# Patient Record
Sex: Female | Born: 1935 | Race: White | Hispanic: No | Smoking: Former smoker
Health system: Southern US, Community
[De-identification: ages and names within clinical notes are randomized; demographics above are authoritative.]

## PROBLEM LIST (undated history)

## (undated) DIAGNOSIS — Z8669 Personal history of other diseases of the nervous system and sense organs: Secondary | ICD-10-CM

## (undated) DIAGNOSIS — K219 Gastro-esophageal reflux disease without esophagitis: Secondary | ICD-10-CM

## (undated) DIAGNOSIS — I10 Essential (primary) hypertension: Secondary | ICD-10-CM

## (undated) DIAGNOSIS — K589 Irritable bowel syndrome without diarrhea: Secondary | ICD-10-CM

## (undated) DIAGNOSIS — R200 Anesthesia of skin: Secondary | ICD-10-CM

## (undated) DIAGNOSIS — N329 Bladder disorder, unspecified: Secondary | ICD-10-CM

## (undated) DIAGNOSIS — G309 Alzheimer's disease, unspecified: Secondary | ICD-10-CM

## (undated) DIAGNOSIS — J329 Chronic sinusitis, unspecified: Secondary | ICD-10-CM

## (undated) DIAGNOSIS — N393 Stress incontinence (female) (male): Secondary | ICD-10-CM

## (undated) DIAGNOSIS — G589 Mononeuropathy, unspecified: Secondary | ICD-10-CM

## (undated) DIAGNOSIS — I679 Cerebrovascular disease, unspecified: Secondary | ICD-10-CM

## (undated) DIAGNOSIS — F0280 Dementia in other diseases classified elsewhere without behavioral disturbance: Secondary | ICD-10-CM

## (undated) DIAGNOSIS — N3281 Overactive bladder: Secondary | ICD-10-CM

## (undated) DIAGNOSIS — R351 Nocturia: Secondary | ICD-10-CM

## (undated) DIAGNOSIS — M419 Scoliosis, unspecified: Secondary | ICD-10-CM

## (undated) DIAGNOSIS — F4322 Adjustment disorder with anxiety: Secondary | ICD-10-CM

## (undated) DIAGNOSIS — H40009 Preglaucoma, unspecified, unspecified eye: Secondary | ICD-10-CM

## (undated) DIAGNOSIS — E785 Hyperlipidemia, unspecified: Secondary | ICD-10-CM

## (undated) DIAGNOSIS — K648 Other hemorrhoids: Secondary | ICD-10-CM

## (undated) DIAGNOSIS — Z9289 Personal history of other medical treatment: Secondary | ICD-10-CM

## (undated) DIAGNOSIS — B372 Candidiasis of skin and nail: Secondary | ICD-10-CM

## (undated) DIAGNOSIS — I2510 Atherosclerotic heart disease of native coronary artery without angina pectoris: Secondary | ICD-10-CM

## (undated) DIAGNOSIS — E039 Hypothyroidism, unspecified: Secondary | ICD-10-CM

## (undated) DIAGNOSIS — R202 Paresthesia of skin: Secondary | ICD-10-CM

## (undated) HISTORY — DX: Overactive bladder: N32.81

## (undated) HISTORY — DX: Hyperlipidemia, unspecified: E78.5

## (undated) HISTORY — DX: Alzheimer's disease, unspecified: G30.9

## (undated) HISTORY — PX: CORONARY ANGIOPLASTY WITH STENT PLACEMENT: SHX49

## (undated) HISTORY — DX: Gastro-esophageal reflux disease without esophagitis: K21.9

## (undated) HISTORY — DX: Adjustment disorder with anxiety: F43.22

## (undated) HISTORY — DX: Candidiasis of skin and nail: B37.2

## (undated) HISTORY — DX: Atherosclerotic heart disease of native coronary artery without angina pectoris: I25.10

## (undated) HISTORY — DX: Chronic sinusitis, unspecified: J32.9

## (undated) HISTORY — DX: Cerebrovascular disease, unspecified: I67.9

## (undated) HISTORY — DX: Other hemorrhoids: K64.8

## (undated) HISTORY — DX: Essential (primary) hypertension: I10

## (undated) HISTORY — PX: CARDIAC CATHETERIZATION: SHX172

## (undated) HISTORY — DX: Dementia in other diseases classified elsewhere without behavioral disturbance: F02.80

## (undated) HISTORY — DX: Personal history of other medical treatment: Z92.89

## (undated) HISTORY — PX: CATARACT EXTRACTION W/ INTRAOCULAR LENS  IMPLANT, BILATERAL: SHX1307

---

## 1966-12-22 HISTORY — PX: VAGINAL HYSTERECTOMY: SUR661

## 1974-12-22 HISTORY — PX: CHOLECYSTECTOMY: SHX55

## 2000-03-17 ENCOUNTER — Encounter: Admission: RE | Admit: 2000-03-17 | Discharge: 2000-03-17 | Payer: Self-pay | Admitting: Family Medicine

## 2000-03-17 ENCOUNTER — Encounter: Payer: Self-pay | Admitting: Family Medicine

## 2000-07-28 ENCOUNTER — Encounter: Admission: RE | Admit: 2000-07-28 | Discharge: 2000-07-28 | Payer: Self-pay | Admitting: Family Medicine

## 2000-07-28 ENCOUNTER — Encounter: Payer: Self-pay | Admitting: Family Medicine

## 2001-07-14 ENCOUNTER — Ambulatory Visit (HOSPITAL_BASED_OUTPATIENT_CLINIC_OR_DEPARTMENT_OTHER): Admission: RE | Admit: 2001-07-14 | Discharge: 2001-07-14 | Payer: Self-pay | Admitting: Pulmonary Disease

## 2001-08-31 ENCOUNTER — Ambulatory Visit (HOSPITAL_COMMUNITY): Admission: RE | Admit: 2001-08-31 | Discharge: 2001-09-01 | Payer: Self-pay | Admitting: Cardiology

## 2001-10-08 ENCOUNTER — Ambulatory Visit (HOSPITAL_BASED_OUTPATIENT_CLINIC_OR_DEPARTMENT_OTHER): Admission: RE | Admit: 2001-10-08 | Discharge: 2001-10-08 | Payer: Self-pay | Admitting: Pulmonary Disease

## 2002-02-23 ENCOUNTER — Encounter: Admission: RE | Admit: 2002-02-23 | Discharge: 2002-02-23 | Payer: Self-pay | Admitting: Family Medicine

## 2002-02-23 ENCOUNTER — Encounter: Payer: Self-pay | Admitting: Family Medicine

## 2002-06-29 ENCOUNTER — Encounter: Payer: Self-pay | Admitting: Gastroenterology

## 2002-06-29 DIAGNOSIS — K648 Other hemorrhoids: Secondary | ICD-10-CM | POA: Insufficient documentation

## 2002-10-12 ENCOUNTER — Other Ambulatory Visit: Admission: RE | Admit: 2002-10-12 | Discharge: 2002-10-12 | Payer: Self-pay | Admitting: Family Medicine

## 2003-01-09 ENCOUNTER — Inpatient Hospital Stay (HOSPITAL_COMMUNITY): Admission: EM | Admit: 2003-01-09 | Discharge: 2003-01-11 | Payer: Self-pay | Admitting: Emergency Medicine

## 2003-01-09 ENCOUNTER — Encounter: Payer: Self-pay | Admitting: Emergency Medicine

## 2003-04-05 ENCOUNTER — Encounter: Admission: RE | Admit: 2003-04-05 | Discharge: 2003-04-05 | Payer: Self-pay | Admitting: Family Medicine

## 2003-04-05 ENCOUNTER — Encounter: Payer: Self-pay | Admitting: Family Medicine

## 2003-11-10 ENCOUNTER — Encounter: Admission: RE | Admit: 2003-11-10 | Discharge: 2003-11-10 | Payer: Self-pay | Admitting: Family Medicine

## 2004-07-15 ENCOUNTER — Encounter: Admission: RE | Admit: 2004-07-15 | Discharge: 2004-07-15 | Payer: Self-pay | Admitting: Family Medicine

## 2004-10-16 ENCOUNTER — Other Ambulatory Visit: Admission: RE | Admit: 2004-10-16 | Discharge: 2004-10-16 | Payer: Self-pay | Admitting: Family Medicine

## 2005-08-15 ENCOUNTER — Ambulatory Visit: Payer: Self-pay | Admitting: Cardiology

## 2005-08-22 ENCOUNTER — Ambulatory Visit: Payer: Self-pay | Admitting: Cardiology

## 2005-08-22 ENCOUNTER — Encounter: Admission: RE | Admit: 2005-08-22 | Discharge: 2005-08-22 | Payer: Self-pay | Admitting: Family Medicine

## 2005-09-04 ENCOUNTER — Encounter: Admission: RE | Admit: 2005-09-04 | Discharge: 2005-09-04 | Payer: Self-pay | Admitting: Family Medicine

## 2005-10-23 ENCOUNTER — Encounter: Admission: RE | Admit: 2005-10-23 | Discharge: 2005-10-23 | Payer: Self-pay | Admitting: Family Medicine

## 2005-11-04 ENCOUNTER — Other Ambulatory Visit: Admission: RE | Admit: 2005-11-04 | Discharge: 2005-11-04 | Payer: Self-pay | Admitting: Family Medicine

## 2006-07-21 ENCOUNTER — Encounter: Admission: RE | Admit: 2006-07-21 | Discharge: 2006-07-21 | Payer: Self-pay | Admitting: Family Medicine

## 2006-09-03 ENCOUNTER — Encounter: Admission: RE | Admit: 2006-09-03 | Discharge: 2006-09-03 | Payer: Self-pay | Admitting: Family Medicine

## 2006-11-30 ENCOUNTER — Ambulatory Visit: Payer: Self-pay | Admitting: Cardiology

## 2006-12-22 LAB — CONVERTED CEMR LAB: Pap Smear: NORMAL

## 2007-07-15 ENCOUNTER — Ambulatory Visit: Payer: Self-pay | Admitting: Gastroenterology

## 2007-08-30 ENCOUNTER — Encounter: Admission: RE | Admit: 2007-08-30 | Discharge: 2007-08-30 | Payer: Self-pay | Admitting: Family Medicine

## 2007-09-14 ENCOUNTER — Encounter: Admission: RE | Admit: 2007-09-14 | Discharge: 2007-09-14 | Payer: Self-pay | Admitting: Family Medicine

## 2007-09-23 ENCOUNTER — Encounter: Admission: RE | Admit: 2007-09-23 | Discharge: 2007-10-08 | Payer: Self-pay | Admitting: Family Medicine

## 2008-02-10 ENCOUNTER — Ambulatory Visit: Payer: Self-pay | Admitting: Cardiovascular Disease

## 2008-03-05 ENCOUNTER — Emergency Department (HOSPITAL_COMMUNITY): Admission: EM | Admit: 2008-03-05 | Discharge: 2008-03-05 | Payer: Self-pay | Admitting: Emergency Medicine

## 2008-03-07 ENCOUNTER — Inpatient Hospital Stay (HOSPITAL_COMMUNITY): Admission: EM | Admit: 2008-03-07 | Discharge: 2008-03-10 | Payer: Self-pay | Admitting: Emergency Medicine

## 2008-04-05 DIAGNOSIS — I2510 Atherosclerotic heart disease of native coronary artery without angina pectoris: Secondary | ICD-10-CM

## 2008-04-05 DIAGNOSIS — K219 Gastro-esophageal reflux disease without esophagitis: Secondary | ICD-10-CM

## 2008-04-05 DIAGNOSIS — K589 Irritable bowel syndrome without diarrhea: Secondary | ICD-10-CM

## 2008-04-05 DIAGNOSIS — E039 Hypothyroidism, unspecified: Secondary | ICD-10-CM

## 2008-04-05 HISTORY — DX: Gastro-esophageal reflux disease without esophagitis: K21.9

## 2008-10-16 ENCOUNTER — Encounter: Payer: Self-pay | Admitting: Internal Medicine

## 2008-10-16 ENCOUNTER — Encounter: Admission: RE | Admit: 2008-10-16 | Discharge: 2008-10-16 | Payer: Self-pay | Admitting: Family Medicine

## 2008-11-20 ENCOUNTER — Telehealth: Payer: Self-pay | Admitting: Internal Medicine

## 2009-01-12 ENCOUNTER — Encounter: Payer: Self-pay | Admitting: Internal Medicine

## 2009-01-15 ENCOUNTER — Ambulatory Visit: Payer: Self-pay | Admitting: Internal Medicine

## 2009-01-15 DIAGNOSIS — B372 Candidiasis of skin and nail: Secondary | ICD-10-CM

## 2009-01-15 DIAGNOSIS — E785 Hyperlipidemia, unspecified: Secondary | ICD-10-CM

## 2009-01-15 DIAGNOSIS — T887XXA Unspecified adverse effect of drug or medicament, initial encounter: Secondary | ICD-10-CM | POA: Insufficient documentation

## 2009-01-15 DIAGNOSIS — M949 Disorder of cartilage, unspecified: Secondary | ICD-10-CM

## 2009-01-15 DIAGNOSIS — F4322 Adjustment disorder with anxiety: Secondary | ICD-10-CM

## 2009-01-15 DIAGNOSIS — M899 Disorder of bone, unspecified: Secondary | ICD-10-CM | POA: Insufficient documentation

## 2009-01-15 LAB — CONVERTED CEMR LAB
Eosinophils Relative: 1.6 % (ref 0.0–5.0)
Lymphocytes Relative: 35.2 % (ref 12.0–46.0)
MCV: 89.4 fL (ref 78.0–100.0)
Monocytes Absolute: 0.4 10*3/uL (ref 0.1–1.0)
Neutro Abs: 2.8 10*3/uL (ref 1.4–7.7)
Neutrophils Relative %: 53.5 % (ref 43.0–77.0)
RDW: 12.6 % (ref 11.5–14.6)
T4, Total: 10.7 ug/dL (ref 5.0–12.5)
WBC: 5.1 10*3/uL (ref 4.5–10.5)

## 2009-02-15 ENCOUNTER — Ambulatory Visit: Payer: Self-pay | Admitting: Cardiovascular Disease

## 2009-02-15 LAB — CONVERTED CEMR LAB
AST: 27 units/L (ref 0–37)
Bilirubin, Direct: 0.2 mg/dL (ref 0.0–0.3)
Cholesterol: 264 mg/dL (ref 0–200)
HDL: 52.1 mg/dL (ref >39.0)
Total Protein: 7.3 g/dL (ref 6.0–8.3)
Triglycerides: 139 mg/dL (ref 0–149)
VLDL: 28 mg/dL (ref 0–40)

## 2009-03-01 ENCOUNTER — Encounter: Payer: Self-pay | Admitting: Cardiology

## 2009-03-01 ENCOUNTER — Ambulatory Visit: Payer: Self-pay

## 2009-03-07 ENCOUNTER — Ambulatory Visit: Payer: Self-pay | Admitting: Internal Medicine

## 2009-03-07 LAB — CONVERTED CEMR LAB
HDL goal, serum: 40 mg/dL
LDL Goal: 100 mg/dL

## 2009-05-15 ENCOUNTER — Encounter (INDEPENDENT_AMBULATORY_CARE_PROVIDER_SITE_OTHER): Payer: Self-pay | Admitting: *Deleted

## 2009-05-31 ENCOUNTER — Ambulatory Visit: Payer: Self-pay | Admitting: Internal Medicine

## 2009-05-31 LAB — CONVERTED CEMR LAB
ALT: 26 units/L (ref 0–35)
AST: 27 units/L (ref 0–37)
Albumin: 3.7 g/dL (ref 3.5–5.2)
Cholesterol: 232 mg/dL — ABNORMAL HIGH (ref 0–200)
Direct LDL: 176.9 mg/dL
HDL: 50.6 mg/dL (ref >39.00)
Total Protein: 7.2 g/dL (ref 6.0–8.3)

## 2009-06-06 ENCOUNTER — Ambulatory Visit: Payer: Self-pay | Admitting: Internal Medicine

## 2009-06-19 ENCOUNTER — Encounter (INDEPENDENT_AMBULATORY_CARE_PROVIDER_SITE_OTHER): Payer: Self-pay | Admitting: *Deleted

## 2009-08-01 ENCOUNTER — Ambulatory Visit: Payer: Self-pay | Admitting: Internal Medicine

## 2009-08-01 LAB — CONVERTED CEMR LAB
Albumin: 3.8 g/dL (ref 3.5–5.2)
Alkaline Phosphatase: 24 units/L — ABNORMAL LOW (ref 39–117)
Direct LDL: 163.5 mg/dL
HDL: 49.2 mg/dL (ref >39.00)
Iron: 98 ug/dL (ref 42–145)
Total Protein: 6.9 g/dL (ref 6.0–8.3)
Triglycerides: 96 mg/dL (ref 0.0–149.0)
VLDL: 19.2 mg/dL (ref 0.0–40.0)

## 2009-08-09 ENCOUNTER — Ambulatory Visit: Payer: Self-pay | Admitting: Internal Medicine

## 2009-09-28 ENCOUNTER — Telehealth: Payer: Self-pay | Admitting: Cardiovascular Disease

## 2009-10-08 ENCOUNTER — Ambulatory Visit: Payer: Self-pay | Admitting: Internal Medicine

## 2009-10-08 ENCOUNTER — Telehealth: Payer: Self-pay | Admitting: Internal Medicine

## 2009-11-22 ENCOUNTER — Encounter (INDEPENDENT_AMBULATORY_CARE_PROVIDER_SITE_OTHER): Payer: Self-pay | Admitting: *Deleted

## 2009-11-28 ENCOUNTER — Encounter: Admission: RE | Admit: 2009-11-28 | Discharge: 2009-11-28 | Payer: Self-pay | Admitting: Internal Medicine

## 2009-12-24 ENCOUNTER — Telehealth: Payer: Self-pay | Admitting: Internal Medicine

## 2010-01-17 ENCOUNTER — Ambulatory Visit: Payer: Self-pay | Admitting: Internal Medicine

## 2010-01-17 DIAGNOSIS — I10 Essential (primary) hypertension: Secondary | ICD-10-CM | POA: Insufficient documentation

## 2010-01-17 LAB — CONVERTED CEMR LAB
CO2: 30 meq/L (ref 19–32)
Creatinine, Ser: 0.6 mg/dL (ref 0.4–1.2)
GFR calc non Af Amer: 103.03 mL/min (ref >60)
Glucose, Bld: 97 mg/dL (ref 70–99)
HDL: 54.8 mg/dL (ref >39.00)
TSH: 0.63 microintl units/mL (ref 0.35–5.50)

## 2010-02-01 ENCOUNTER — Ambulatory Visit: Payer: Self-pay | Admitting: Cardiovascular Disease

## 2010-02-01 DIAGNOSIS — R109 Unspecified abdominal pain: Secondary | ICD-10-CM

## 2010-02-06 ENCOUNTER — Encounter: Payer: Self-pay | Admitting: Cardiovascular Disease

## 2010-02-06 ENCOUNTER — Ambulatory Visit: Payer: Self-pay

## 2010-05-07 ENCOUNTER — Ambulatory Visit: Payer: Self-pay | Admitting: Internal Medicine

## 2010-05-07 LAB — CONVERTED CEMR LAB
Bilirubin Urine: NEGATIVE
Glucose, Urine, Semiquant: NEGATIVE
Nitrite: NEGATIVE

## 2010-05-29 ENCOUNTER — Encounter: Payer: Self-pay | Admitting: Internal Medicine

## 2010-08-07 ENCOUNTER — Ambulatory Visit: Payer: Self-pay | Admitting: Internal Medicine

## 2010-09-30 ENCOUNTER — Encounter: Payer: Self-pay | Admitting: Internal Medicine

## 2010-10-14 ENCOUNTER — Ambulatory Visit: Payer: Self-pay | Admitting: Family Medicine

## 2010-10-14 DIAGNOSIS — J0100 Acute maxillary sinusitis, unspecified: Secondary | ICD-10-CM | POA: Insufficient documentation

## 2010-11-08 ENCOUNTER — Ambulatory Visit: Payer: Self-pay | Admitting: Internal Medicine

## 2010-11-13 ENCOUNTER — Ambulatory Visit: Payer: Self-pay | Admitting: Internal Medicine

## 2010-11-27 DIAGNOSIS — H6120 Impacted cerumen, unspecified ear: Secondary | ICD-10-CM

## 2010-12-02 ENCOUNTER — Encounter: Payer: Self-pay | Admitting: Internal Medicine

## 2010-12-02 ENCOUNTER — Encounter
Admission: RE | Admit: 2010-12-02 | Discharge: 2010-12-02 | Payer: Self-pay | Source: Home / Self Care | Attending: Internal Medicine | Admitting: Internal Medicine

## 2011-01-21 NOTE — Assessment & Plan Note (Signed)
Summary: ear washed per dr//ccm   Vital Signs:  Patient profile:   77 year old female Height:      58 inches Weight:      160 pounds BP sitting:   132 / 78 CC: ear wax removal   Primary Care Provider:  Stacie Glaze MD  CC:  ear wax removal.  History of Present Illness: ear wax removal only  Allergies: 1)  ! * Statins 2)  ! Asa 3)  ! Pcn 4)  ! * Muscle Relaxants 5)  ! Levaquin 6)  ! * Lumigan 7)  ! Claritin 8)  ! Vioxx   Impression & Recommendations:  Problem # 1:  CERUMEN IMPACTION, BILATERAL (ICD-380.4) Assessment New  informed consent obtained, using a cerumin spoon the wax impaction was dislodged and the canal was lavaged with 1/2 peroxide and 1/2 warm water solution until clear  Orders: No Charge Patient Arrived (NCPA0) (NCPA0) Cerumen Impaction Removal (40347)  Complete Medication List: 1)  Synthroid 125 Mcg Tabs (Levothyroxine sodium) .Marland Kitchen.. 1 once daily 2)  Ditropan Xl 5 Mg Xr24h-tab (Oxybutynin chloride) .Marland Kitchen.. 1 once daily 3)  Premarin 0.625 Mg Tabs (Estrogens conjugated) .Marland Kitchen.. 1 once daily 4)  Nexium 40 Mg Cpdr (Esomeprazole magnesium) .Marland Kitchen.. 1 two times a day 5)  Colace 50 Mg Caps (Docusate sodium) .... 1/2 once daily 6)  Cvs Vitamin B-6 100 Mg Tabs (Pyridoxine hcl) .Marland Kitchen.. 1 once daily 7)  B-12 500 Mcg Tabs (Cyanocobalamin) .Marland Kitchen.. 1 once daily 8)  Vitamin E 400 Unit Caps (Vitamin e) .Marland Kitchen.. 1 once daily 9)  Acidophilus Caps (Lactobacillus) .Marland Kitchen.. 1 once daily 10)  Fish Oil Concentrate 1000 Mg Caps (Omega-3 fatty acids) .... 3 per day 11)  Welchol 3.75 Gm Pack (Colesevelam hcl) .... In juice or water daily 12)  Nitroglycerin 0.4 Mg Subl (Nitroglycerin) .... One tablet under tongue every 5 minutes as needed for chest pain---may repeat times three 13)  Atenolol-chlorthalidone 50-25 Mg Tabs (Atenolol-chlorthalidone) .... One by mouth daily to replace the maxide and the metoprolol in am   Orders Added: 1)  No Charge Patient Arrived (NCPA0) [NCPA0] 2)  Cerumen  Impaction Removal [[id number]]

## 2011-01-21 NOTE — Assessment & Plan Note (Signed)
Summary: follow up/cjr   Vital Signs:  Patient profile:   77 year old female Height:      59 inches Weight:      161 pounds BMI:     32.64 Temp:     98.2 degrees F oral Pulse rate:   72 / minute Pulse rhythm:   regular Resp:     14 per minute BP sitting:   138 / 70  (left arm)  Vitals Entered By: Willy Eddy, LPN (January 17, 2010 10:36 AM) CC: roa   CC:  roa.  History of Present Illness: Back for a trip to [place] GERD and lipid follow up slight increased in weight stable reflux blood pressure is OK anxiety is still an issue   Preventive Screening-Counseling & Management  Alcohol-Tobacco     Smoking Status: quit     Year Quit: 1985     Passive Smoke Exposure: no  Problems Prior to Update: 1)  Uti  (ICD-599.0) 2)  Disorder of Bone and Cartilage Unspecified  (ICD-733.90) 3)  Uns Advrs Eff Uns Rx Medicinal&biological Sbstnc  (ICD-995.20) 4)  Adjustment Disorder With Anxious Mood  (ICD-309.24) 5)  Moniliasis, Skin/nails  (ICD-112.3) 6)  Family History of Cad Female 1st Degree Relative <60  (ICD-V16.49) 7)  Hyperlipidemia  (ICD-272.4) 8)  Unspecified Hypothyroidism  (ICD-244.9) 9)  Irritable Bowel Syndrome, Hx of  (ICD-V12.79) 10)  Hypothyroidism  (ICD-244.9) 11)  Coronary Artery Disease  (ICD-414.00) 12)  Hx of Gerd  (ICD-530.81) 13)  Internal Hemorrhoids  (ICD-455.0)  Medications Prior to Update: 1)  Synthroid 125 Mcg Tabs (Levothyroxine Sodium) .Marland Kitchen.. 1 Once Daily 2)  Maxzide-25 37.5-25 Mg Tabs (Triamterene-Hctz) .Marland Kitchen.. 1 Once Daily 3)  Toprol Xl 25 Mg Xr24h-Tab (Metoprolol Succinate) .Marland Kitchen.. 1 Once Daily 4)  Ditropan Xl 5 Mg Xr24h-Tab (Oxybutynin Chloride) .Marland Kitchen.. 1 Once Daily 5)  Premarin 0.625 Mg Tabs (Estrogens Conjugated) .Marland Kitchen.. 1 Once Daily 6)  Nexium 40 Mg Cpdr (Esomeprazole Magnesium) .Marland Kitchen.. 1 Two Times A Day 7)  Colace 50 Mg Caps (Docusate Sodium) .... 1/2 Once Daily 8)  Cvs Vitamin B-6 100 Mg Tabs (Pyridoxine Hcl) .Marland Kitchen.. 1 Once Daily 9)  B-12 500 Mcg Tabs  (Cyanocobalamin) .Marland Kitchen.. 1 Once Daily 10)  Vitamin E 400 Unit Caps (Vitamin E) .Marland Kitchen.. 1 Once Daily 11)  Acidophilus  Caps (Lactobacillus) .Marland Kitchen.. 1 Once Daily 12)  Fish Oil Concentrate 1000 Mg Caps (Omega-3 Fatty Acids) .... 3 Per Day 13)  Grifulvin V 500 Mg Tabs (Griseofulvin Microsize) .... One By Mouth Daily 14)  Drisdol 38756 Unit Caps (Ergocalciferol) .Marland Kitchen.. 1 Every Week Completed 15)  Welchol 625 Mg Tabs (Colesevelam Hcl) .... Two By Mouth Three Times A Day 16)  Fluconazole 100 Mg Tabs (Fluconazole) .... One By Mouth Daily For 14 Days 17)  Nitroglycerin 0.4 Mg Subl (Nitroglycerin) .... One Tablet Under Tongue Every 5 Minutes As Needed For Chest Pain---May Repeat Times Three  Current Medications (verified): 1)  Synthroid 125 Mcg Tabs (Levothyroxine Sodium) .Marland Kitchen.. 1 Once Daily 2)  Maxzide-25 37.5-25 Mg Tabs (Triamterene-Hctz) .Marland Kitchen.. 1 Once Daily 3)  Toprol Xl 25 Mg Xr24h-Tab (Metoprolol Succinate) .Marland Kitchen.. 1 Once Daily 4)  Ditropan Xl 5 Mg Xr24h-Tab (Oxybutynin Chloride) .Marland Kitchen.. 1 Once Daily 5)  Premarin 0.625 Mg Tabs (Estrogens Conjugated) .Marland Kitchen.. 1 Once Daily 6)  Nexium 40 Mg Cpdr (Esomeprazole Magnesium) .Marland Kitchen.. 1 Two Times A Day 7)  Colace 50 Mg Caps (Docusate Sodium) .... 1/2 Once Daily 8)  Cvs Vitamin B-6 100 Mg Tabs (Pyridoxine Hcl) .Marland KitchenMarland KitchenMarland Kitchen  1 Once Daily 9)  B-12 500 Mcg Tabs (Cyanocobalamin) .Marland Kitchen.. 1 Once Daily 10)  Vitamin E 400 Unit Caps (Vitamin E) .Marland Kitchen.. 1 Once Daily 11)  Acidophilus  Caps (Lactobacillus) .Marland Kitchen.. 1 Once Daily 12)  Fish Oil Concentrate 1000 Mg Caps (Omega-3 Fatty Acids) .... 3 Per Day 13)  Grifulvin V 500 Mg Tabs (Griseofulvin Microsize) .... One By Mouth Daily 14)  Welchol 625 Mg Tabs (Colesevelam Hcl) .... Two By Mouth Three Times A Day 15)  Nitroglycerin 0.4 Mg Subl (Nitroglycerin) .... One Tablet Under Tongue Every 5 Minutes As Needed For Chest Pain---May Repeat Times Three  Allergies (verified): 1)  ! * Statins 2)  ! Asa 3)  ! Pcn 4)  ! * Muscle Relaxants 5)  ! Levaquin 6)  ! *  Lumigan 7)  ! Claritin 8)  ! Vioxx  Past History:  Family History: Last updated: 01/15/2009 Family History of CAD Female 1st degree relative <60 Family History Ovarian cancer  older sister at 26's  Social History: Last updated: 01/15/2009 Occupation:retired Married Never Smoked Alcohol use-no  Risk Factors: Smoking Status: quit (01/17/2010) Passive Smoke Exposure: no (01/17/2010)  Past medical, surgical, family and social histories (including risk factors) reviewed, and no changes noted (except as noted below).  Past Medical History: Reviewed history from 01/15/2009 and no changes required. Current Problems:  IRRITABLE BOWEL SYNDROME, HX OF (ICD-V12.79) HYPOTHYROIDISM (ICD-244.9) CORONARY ARTERY DISEASE (ICD-414.00) Hx of GERD (ICD-530.81) INTERNAL HEMORRHOIDS (ICD-455.0) Hyperthyroidism Hyperlipidemia fungal nail incontinance Dr Kimbrough-urologist incontinance of bladder  and infections Dr Mayford Knife- Derm -has fungal nail Dr Leron Croak  CAD dr stark for GI  Past Surgical History: Reviewed history from 04/05/2008 and no changes required. Cholecystectomy Angioplasty/stent Hysterectomy  Family History: Reviewed history from 01/15/2009 and no changes required. Family History of CAD Female 1st degree relative <60 Family History Ovarian cancer  older sister at 29's  Social History: Reviewed history from 01/15/2009 and no changes required. Occupation:retired Married Never Smoked Alcohol use-no  Review of Systems  The patient denies anorexia, fever, weight loss, weight gain, vision loss, decreased hearing, hoarseness, chest pain, syncope, dyspnea on exertion, peripheral edema, prolonged cough, headaches, hemoptysis, abdominal pain, melena, hematochezia, severe indigestion/heartburn, hematuria, incontinence, genital sores, muscle weakness, suspicious skin lesions, transient blindness, difficulty walking, depression, unusual weight change, abnormal bleeding,  enlarged lymph nodes, angioedema, and breast masses.    Physical Exam  General:  Well-developed,well-nourished,in no acute distress; alert,appropriate and cooperative throughout examination Head:  Normocephalic and atraumatic without obvious abnormalities. No apparent alopecia or balding. Eyes:  pupils equal and pupils round.   Ears:  she has some cerumen right canal. No other acute changes Nose:  External nasal examination shows no deformity or inflammation. Nasal mucosa are pink and moist without lesions or exudates. Mouth:  Oral mucosa and oropharynx without lesions or exudates.  Teeth in good repair. Neck:  No deformities, masses, or tenderness noted. Lungs:  clear to auscultation throughout Heart:  regular rhythm and rate Abdomen:  Bowel sounds positive,abdomen soft and non-tender without masses, organomegaly or hernias  slight tendernes sover teh right pelvic region   Impression & Recommendations:  Problem # 1:  ADJUSTMENT DISORDER WITH ANXIOUS MOOD (ICD-309.24) referral to [facility]  Problem # 2:  UNSPECIFIED HYPOTHYROIDISM (ICD-244.9)  Her updated medication list for this problem includes:    Synthroid 125 Mcg Tabs (Levothyroxine sodium) .Marland Kitchen... 1 once daily  Labs Reviewed: TSH: 0.80 (01/15/2009)   Total T4: 10.7 (01/15/2009)    Chol: 223 (08/01/2009)   HDL:  49.20 (08/01/2009)   LDL: DEL (02/15/2009)   TG: 96.0 (08/01/2009)  Orders: TLB-TSH (Thyroid Stimulating Hormone) (84443-TSH)  Problem # 3:  HYPERTENSION, MODERATE (ICD-401.9)  Her updated medication list for this problem includes:    Maxzide-25 37.5-25 Mg Tabs (Triamterene-hctz) .Marland Kitchen... 1 once daily    Toprol Xl 25 Mg Xr24h-tab (Metoprolol succinate) .Marland Kitchen... 1 once daily  Orders: TLB-BMP (Basic Metabolic Panel-BMET) (80048-METABOL)  BP today: 138/70 Prior BP: 120/70 (10/08/2009)  Prior 10 Yr Risk Heart Disease: N/A (03/07/2009)  Labs Reviewed: Chol: 223 (08/01/2009)   HDL: 49.20 (08/01/2009)   LDL: DEL  (02/15/2009)   TG: 96.0 (08/01/2009)  Problem # 4:  HYPOTHYROIDISM (ICD-244.9)  Her updated medication list for this problem includes:    Synthroid 125 Mcg Tabs (Levothyroxine sodium) .Marland Kitchen... 1 once daily  Orders: TLB-TSH (Thyroid Stimulating Hormone) (84443-TSH)  Labs Reviewed: TSH: 0.80 (01/15/2009)   Total T4: 10.7 (01/15/2009)    Chol: 223 (08/01/2009)   HDL: 49.20 (08/01/2009)   LDL: DEL (02/15/2009)   TG: 96.0 (08/01/2009)  Complete Medication List: 1)  Synthroid 125 Mcg Tabs (Levothyroxine sodium) .Marland Kitchen.. 1 once daily 2)  Maxzide-25 37.5-25 Mg Tabs (Triamterene-hctz) .Marland Kitchen.. 1 once daily 3)  Toprol Xl 25 Mg Xr24h-tab (Metoprolol succinate) .Marland Kitchen.. 1 once daily 4)  Ditropan Xl 5 Mg Xr24h-tab (Oxybutynin chloride) .Marland Kitchen.. 1 once daily 5)  Premarin 0.625 Mg Tabs (Estrogens conjugated) .Marland Kitchen.. 1 once daily 6)  Nexium 40 Mg Cpdr (Esomeprazole magnesium) .Marland Kitchen.. 1 two times a day 7)  Colace 50 Mg Caps (Docusate sodium) .... 1/2 once daily 8)  Cvs Vitamin B-6 100 Mg Tabs (Pyridoxine hcl) .Marland Kitchen.. 1 once daily 9)  B-12 500 Mcg Tabs (Cyanocobalamin) .Marland Kitchen.. 1 once daily 10)  Vitamin E 400 Unit Caps (Vitamin e) .Marland Kitchen.. 1 once daily 11)  Acidophilus Caps (Lactobacillus) .Marland Kitchen.. 1 once daily 12)  Fish Oil Concentrate 1000 Mg Caps (Omega-3 fatty acids) .... 3 per day 13)  Grifulvin V 500 Mg Tabs (Griseofulvin microsize) .... One by mouth daily 14)  Welchol 625 Mg Tabs (Colesevelam hcl) .... Two by mouth three times a day 15)  Nitroglycerin 0.4 Mg Subl (Nitroglycerin) .... One tablet under tongue every 5 minutes as needed for chest pain---may repeat times three  Other Orders: Psychology Referral (Psychology) Venipuncture [phone]) TLB-Cholesterol, HDL (83718-HDL) TLB-Cholesterol, Direct LDL (83721-DIRLDL) TLB-Cholesterol, Total (82465-CHO)  Patient Instructions: 1)  Please schedule a follow-up appointment in 3 months. Prescriptions: SYNTHROID 125 MCG TABS (LEVOTHYROXINE SODIUM) 1 once daily  #90 x 3   Entered by:    Willy Eddy, LPN   Authorized by:   Stacie Glaze MD   Signed by:   Willy Eddy, LPN on 29/56/2130   Method used:   Print then Give to Patient   RxID:   [id number] TOPROL XL 25 MG XR24H-TAB (METOPROLOL SUCCINATE) 1 once daily  #90 x 3   Entered by:   Willy Eddy, LPN   Authorized by:   Stacie Glaze MD   Signed by:   Willy Eddy, LPN on 32/44/0102   Method used:   Print then Give to Patient   RxID:   [id number]

## 2011-01-21 NOTE — Progress Notes (Signed)
Summary: Pt is req refill of generic Maxzide  Phone Note Call from Patient Call back at Home Phone [phone]   Caller: Patient Summary of Call: Pt called and said that [email or web address] will calling Dr. Lovell Sheehan in order to get refill for Triameterene (Maxzide). Pt just wanted to make Dr Lovell Sheehan aware.  Initial call taken by: Lucy Antigua,  December 24, 2009 11:39 AM    Prescriptions: MAXZIDE-25 37.5-25 MG TABS (TRIAMTERENE-HCTZ) 1 once daily  #100 x 3   Entered by:   Willy Eddy, LPN   Authorized by:   Stacie Glaze MD   Signed by:   Willy Eddy, LPN on 09/81/1914   Method used:   Electronically to        [facility]  [phone]* (retail)       [address]       [place], [place]  [address]       Ph: [phone] or [phone]       Fax: [phone]   RxID:   [id number]   Appended Document: Pt is req refill of generic Maxzide cheaper at [facility]- disregard [place] call

## 2011-01-21 NOTE — Miscellaneous (Signed)
  Clinical Lists Changes  Observations: Added new observation of FLU VAX: Historical (09/21/2010 14:04)      Immunization History:  Influenza Immunization History:    Influenza:  historical (09/21/2010)

## 2011-01-21 NOTE — Assessment & Plan Note (Signed)
Summary: fup//ccm   Vital Signs:  Patient profile:   77 year old female Height:      59 inches Weight:      160 pounds BMI:     32.43 Temp:     98.2 degrees F oral Pulse rate:   72 / minute Resp:     14 per minute BP sitting:   150 / 74  (left arm)  Vitals Entered By: Willy Eddy, LPN (May 07, 2010 1:54 PM) CC: roa, Hypertension Management   Primary Care Provider:  Stacie Glaze MD  CC:  roa and Hypertension Management.  History of Present Illness: the pt has been under increased stress caring for her husband recent evalution by cardiology for groin pain showed no obstruction HTN is controlled but has a hx of "white coat " elevations in the office   Hypertension History:      She denies headache, chest pain, palpitations, dyspnea with exertion, orthopnea, PND, peripheral edema, visual symptoms, neurologic problems, syncope, and side effects from treatment.  recheck 140/90.        Positive major cardiovascular risk factors include female age 63 years old or older, hyperlipidemia, and hypertension.  Negative major cardiovascular risk factors include negative family history for ischemic heart disease and non-tobacco-user status.        Positive history for target organ damage include ASHD (either angina/prior MI/prior CABG).  Further assessment for target organ damage reveals no history of stroke/TIA or peripheral vascular disease.     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) 11)  Moniliasis, Skin/nails  (ICD-112.3) 12)  Family History of Cad Female 1st Degree Relative <60  (ICD-V16.49) 13)  Irritable Bowel Syndrome, Hx of  (ICD-V12.79) 14)  Internal Hemorrhoids  (ICD-455.0)  Current Problems (verified): 1)  Groin Pain  (ICD-789.09) 2)  Coronary Artery Disease  (ICD-414.00) 3)  Hypertension, Moderate  (ICD-401.9) 4)  Hyperlipidemia  (ICD-272.4) 5)  Hx of Gerd  (ICD-530.81) 6)  Hypothyroidism  (ICD-244.9) 7)  Uti  (ICD-599.0) 8)  Disorder of Bone and Cartilage Unspecified  (ICD-733.90) 9)  Uns Advrs Eff Uns Rx Medicinal&biological Sbstnc  (ICD-995.20) 10)  Adjustment Disorder With Anxious Mood  (ICD-309.24) 11)  Moniliasis, Skin/nails  (ICD-112.3) 12)  Family History of Cad Female 1st Degree Relative <60  (ICD-V16.49) 13)  Irritable Bowel Syndrome, Hx of  (ICD-V12.79) 14)  Internal Hemorrhoids  (ICD-455.0)  Medications Prior to Update: 1)  Synthroid 125 Mcg Tabs (Levothyroxine Sodium) .Marland Kitchen.. 1 Once Daily 2)  Maxzide-25 37.5-25 Mg Tabs (Triamterene-Hctz) .Marland Kitchen.. 1 Once Daily 3)  Toprol Xl 25 Mg Xr24h-Tab (Metoprolol Succinate) .Marland Kitchen.. 1 Once Daily 4)  Ditropan Xl 5 Mg Xr24h-Tab (Oxybutynin Chloride) .Marland Kitchen.. 1 Once Daily 5)  Premarin 0.625 Mg Tabs (Estrogens Conjugated) .Marland Kitchen.. 1 Once Daily 6)  Nexium 40 Mg Cpdr (Esomeprazole Magnesium) .Marland Kitchen.. 1 Two Times A Day 7)  Colace 50 Mg Caps (Docusate Sodium) .... 1/2 Once Daily  8)  Cvs Vitamin B-6 100 Mg Tabs (Pyridoxine Hcl) .Marland Kitchen.. 1 Once Daily 9)  B-12 500 Mcg Tabs (Cyanocobalamin) .Marland Kitchen.. 1 Once Daily 10)  Vitamin E 400 Unit Caps (Vitamin E) .Marland Kitchen.. 1 Once Daily 11)  Acidophilus  Caps (Lactobacillus) .Marland Kitchen.. 1 Once Daily 12)  Fish Oil Concentrate 1000 Mg Caps (Omega-3 Fatty Acids) .... 3 Per Day 13)  Welchol 625 Mg Tabs (Colesevelam Hcl) .... Two By Mouth Three Times A Day 14)  Nitroglycerin 0.4 Mg Subl (Nitroglycerin) .... One Tablet Under Tongue Every 5 Minutes As Needed For Chest Pain---May  Repeat Times Three  Current Medications (verified): 1)  Synthroid 125 Mcg Tabs (Levothyroxine Sodium) .Marland Kitchen.. 1 Once Daily 2)  Maxzide-25 37.5-25 Mg Tabs (Triamterene-Hctz) .Marland Kitchen.. 1 Once Daily 3)  Toprol Xl 25 Mg Xr24h-Tab (Metoprolol Succinate) .Marland Kitchen.. 1 Once Daily 4)  Ditropan Xl 5 Mg Xr24h-Tab (Oxybutynin Chloride) .Marland Kitchen.. 1 Once Daily 5)  Premarin 0.625 Mg Tabs (Estrogens Conjugated) .Marland Kitchen.. 1 Once Daily 6)  Nexium 40 Mg Cpdr (Esomeprazole Magnesium) .Marland Kitchen.. 1 Two Times A Day 7)  Colace 50 Mg Caps (Docusate Sodium) .... 1/2 Once Daily 8)  Cvs Vitamin B-6 100 Mg Tabs (Pyridoxine Hcl) .Marland Kitchen.. 1 Once Daily 9)  B-12 500 Mcg Tabs (Cyanocobalamin) .Marland Kitchen.. 1 Once Daily 10)  Vitamin E 400 Unit Caps (Vitamin E) .Marland Kitchen.. 1 Once Daily 11)  Acidophilus  Caps (Lactobacillus) .Marland Kitchen.. 1 Once Daily 12)  Fish Oil Concentrate 1000 Mg Caps (Omega-3 Fatty Acids) .... 3 Per Day 13)  Welchol 625 Mg Tabs (Colesevelam Hcl) .... Two By Mouth Three Times A Day 14)  Nitroglycerin 0.4 Mg Subl (Nitroglycerin) .... One Tablet Under Tongue Every 5 Minutes As Needed For Chest Pain---May Repeat Times Three  Allergies (verified): 1)  ! * Statins 2)  ! Asa 3)  ! Pcn 4)  ! * Muscle Relaxants 5)  ! Levaquin 6)  ! * Lumigan 7)  ! Claritin 8)  ! Vioxx  Past History:  Family History: Last updated: 01/30/2010  There is a strong family history of multiple cancer  deaths in her sisters and brothers, from breast cancer, ovarian cancer.   Also a strong family history of heart disease with one of her sisters  dying from heart disease, the other one dying during coronary artery  bypass graft and another brother dying of heart disease.      Social History: Last updated: 01/30/2010 Occupation:retired Married Never Smoked Alcohol use-no  Risk Factors: Smoking Status: quit (05/07/2010) Passive Smoke Exposure: no (05/07/2010)  Past medical, surgical, family and social histories (including risk factors) reviewed, and no changes noted (except as  noted below).  Past Medical History: Reviewed history from 02/01/2010 and no changes required. Current Problems:  CORONARY ARTERY DISEASE (ICD-414.00)- stenting of the left circumflex in  2002.  HYPERTENSION, MODERATE (ICD-401.9) HYPERLIPIDEMIA (ICD-272.4) Hx of GERD (ICD-530.81) HYPOTHYROIDISM (ICD-244.9) UTI (ICD-599.0) DISORDER OF BONE AND CARTILAGE UNSPECIFIED (ICD-733.90) UNS ADVRS EFF UNS RX MEDICINAL&BIOLOGICAL SBSTNC (ICD-995.20) ADJUSTMENT DISORDER WITH ANXIOUS MOOD (ICD-309.24) MONILIASIS, SKIN/NAILS (ICD-112.3) FAMILY HISTORY OF CAD FEMALE 1ST DEGREE RELATIVE <60 (ICD-V16.49) IRRITABLE BOWEL SYNDROME, HX OF (ICD-V12.79) INTERNAL HEMORRHOIDS (ICD-455.0)  Past Surgical History: Reviewed history from 01/30/2010 and no changes required. stenting of the left circumflex in  2004.  Hysterectomy Cholecystectomy  Family History: Reviewed history from 01/30/2010 and no changes required.  There is a strong family history of multiple cancer  deaths in her sisters and brothers, from breast cancer, ovarian cancer.   Also a strong family history of  heart disease with one of her sisters  dying from heart disease, the other one dying during coronary artery  bypass graft and another brother dying of heart disease.      Social History: Reviewed history from 01/30/2010 and no changes required. Occupation:retired Married Never Smoked Alcohol use-no  Review of Systems  The patient denies anorexia, fever, weight loss, weight gain, vision loss, decreased hearing, hoarseness, chest pain, syncope, dyspnea on exertion, peripheral edema, prolonged cough, headaches, hemoptysis, abdominal pain, melena, hematochezia, severe indigestion/heartburn, hematuria, incontinence, genital sores, muscle weakness, suspicious skin lesions, transient blindness, difficulty walking, depression, unusual weight change, abnormal bleeding, enlarged lymph nodes, angioedema, and breast masses.    Physical  Exam  General:  Well-developed,well-nourished,in no acute distress; alert,appropriate and cooperative throughout examination Head:  Normocephalic and atraumatic without obvious abnormalities. No apparent alopecia or balding. Eyes:  pupils equal and pupils round.   Ears:  she has some cerumen right canal. No other acute changes Mouth:  Oral mucosa and oropharynx without lesions or exudates.  Teeth in good repair. Lungs:  clear to auscultation throughout Abdomen:  Bowel sounds positive,abdomen soft and non-tender without masses, organomegaly or hernias  slight tendernes sover teh right pelvic region   Impression & Recommendations:  Problem # 1:  HYPERTENSION, MODERATE (ICD-401.9)  Her updated medication list for this problem includes:    Maxzide-25 37.5-25 Mg Tabs (Triamterene-hctz) .Marland Kitchen... 1 once daily    Toprol Xl 25 Mg Xr24h-tab (Metoprolol succinate) .Marland Kitchen... 1 once daily  BP today: 150/74 Prior BP: 134/64 (02/01/2010)  Prior 10 Yr Risk Heart Disease: N/A (03/07/2009)  Labs Reviewed: K+: 4.1 (01/17/2010) Creat: : 0.6 (01/17/2010)   Chol: 239 (01/17/2010)   HDL: 54.80 (01/17/2010)   LDL: DEL (02/15/2009)   TG: 96.0 (08/01/2009)  Problem # 2:  GROIN PAIN (ICD-789.09)  see results of the doplers the pain has decreased with less lifting  Discussed use of medications, application of heat or cold, and exercises.   Problem # 3:  UTI (ICD-599.0)  Her updated medication list for this problem includes:    Ditropan Xl 5 Mg Xr24h-tab (Oxybutynin chloride) .Marland Kitchen... 1 once daily  Orders: UA Dipstick w/o Micro (automated)  (81003)  Encouraged to push clear liquids, get enough rest, and take acetaminophen as needed. To be seen in 10 days if no improvement, sooner if worse.  Problem # 4:  HYPOTHYROIDISM (ICD-244.9)  Her updated medication list for this problem includes:    Synthroid 125 Mcg Tabs (Levothyroxine sodium) .Marland Kitchen... 1 once daily  Labs Reviewed: TSH: 0.63 (01/17/2010)   Total T4:  10.7 (01/15/2009)    Chol: 239 (01/17/2010)   HDL: 54.80 (01/17/2010)   LDL: DEL (02/15/2009)   TG: 96.0 (08/01/2009)  Complete Medication List: 1)  Synthroid 125 Mcg Tabs (Levothyroxine sodium) .Marland Kitchen.. 1 once daily 2)  Maxzide-25 37.5-25 Mg Tabs (Triamterene-hctz) .Marland Kitchen.. 1 once daily 3)  Toprol Xl 25 Mg Xr24h-tab (Metoprolol succinate) .Marland Kitchen.. 1 once daily 4)  Ditropan Xl 5 Mg Xr24h-tab (Oxybutynin chloride) .Marland Kitchen.. 1 once daily 5)  Premarin 0.625 Mg Tabs (Estrogens conjugated) .Marland Kitchen.. 1 once daily 6)  Nexium 40 Mg Cpdr (Esomeprazole magnesium) .Marland Kitchen.. 1 two times a day 7)  Colace 50 Mg Caps (Docusate sodium) .... 1/2 once daily 8)  Cvs Vitamin B-6 100 Mg Tabs (Pyridoxine hcl) .Marland Kitchen.. 1 once daily 9)  B-12 500 Mcg Tabs (Cyanocobalamin) .Marland Kitchen.. 1 once daily 10)  Vitamin E 400 Unit Caps (Vitamin e) .Marland Kitchen.. 1 once daily 11)  Acidophilus Caps (Lactobacillus) .Marland KitchenMarland KitchenMarland Kitchen 1  once daily 12)  Fish Oil Concentrate 1000 Mg Caps (Omega-3 fatty acids) .... 3 per day 13)  Welchol 625 Mg Tabs (Colesevelam hcl) .... Two by mouth three times a day 14)  Nitroglycerin 0.4 Mg Subl (Nitroglycerin) .... One tablet under tongue every 5 minutes as needed for chest pain---may repeat times three  Hypertension Assessment/Plan:      The patient's hypertensive risk group is category C: Target organ damage and/or diabetes.  Today's blood pressure is 150/74.  Her blood pressure goal is < 140/90.  Patient Instructions: 1)  Please schedule a follow-up appointment in 3 months. Prescriptions: TOPROL XL 25 MG XR24H-TAB (METOPROLOL SUCCINATE) 1 once daily  #90 x 3   Entered by:   Willy Eddy, LPN   Authorized by:   Stacie Glaze MD   Signed by:   Willy Eddy, LPN on 84/13/2440   Method used:   Print then Give to Patient   RxID:   [id number] NEXIUM 40 MG CPDR (ESOMEPRAZOLE MAGNESIUM) 1 two times a day  #180 x 3   Entered by:   Willy Eddy, LPN   Authorized by:   Stacie Glaze MD   Signed by:   Willy Eddy, LPN on  25/95/6387   Method used:   Print then Give to Patient   RxID:   [id number]   Laboratory Results   Urine Tests    Routine Urinalysis   Color: yellow Appearance: Clear Glucose: negative   (Normal Range: Negative) Bilirubin: negative   (Normal Range: Negative) Ketone: negative   (Normal Range: Negative) Spec. Gravity: 1.010   (Normal Range: 1.003-1.035) Blood: negative   (Normal Range: Negative) pH: 6.0   (Normal Range: 5.0-8.0) Protein: negative   (Normal Range: Negative) Urobilinogen: 0.2   (Normal Range: 0-1) Nitrite: negative   (Normal Range: Negative) Leukocyte Esterace: negative   (Normal Range: Negative)    Comments: Rita Ohara  May 07, 2010 1:52 PM

## 2011-01-21 NOTE — Assessment & Plan Note (Signed)
Summary: f1y    Visit Type:  1 year follow up Primary Provider:  Stacie Glaze MD  CC:  No cardiac complains.  History of Present Illness: Joyce [name] is a 77 year old woman with coronary artery disease who underwent stenting of the left circumflex in 2002. Relook cath in 2004 showed stent patency. She presents today for follow-up evaluation.  She complains of right groin pain over the past several months, worse prior to a BM, also with lifting, sitting, or walking.   Denies chest pain, orthopnea, or PND. Complains of episodic left leg swelling over several years, and chronic DOE, stable over time.  Current Medications (verified): 1)  Synthroid 125 Mcg Tabs (Levothyroxine Sodium) .Marland Kitchen.. 1 Once Daily 2)  Maxzide-25 37.5-25 Mg Tabs (Triamterene-Hctz) .Marland Kitchen.. 1 Once Daily 3)  Toprol Xl 25 Mg Xr24h-Tab (Metoprolol Succinate) .Marland Kitchen.. 1 Once Daily 4)  Ditropan Xl 5 Mg Xr24h-Tab (Oxybutynin Chloride) .Marland Kitchen.. 1 Once Daily 5)  Premarin 0.625 Mg Tabs (Estrogens Conjugated) .Marland Kitchen.. 1 Once Daily 6)  Nexium 40 Mg Cpdr (Esomeprazole Magnesium) .Marland Kitchen.. 1 Two Times A Day 7)  Colace 50 Mg Caps (Docusate Sodium) .... 1/2 Once Daily 8)  Cvs Vitamin B-6 100 Mg Tabs (Pyridoxine Hcl) .Marland Kitchen.. 1 Once Daily 9)  B-12 500 Mcg Tabs (Cyanocobalamin) .Marland Kitchen.. 1 Once Daily 10)  Vitamin E 400 Unit Caps (Vitamin E) .Marland Kitchen.. 1 Once Daily 11)  Acidophilus  Caps (Lactobacillus) .Marland Kitchen.. 1 Once Daily 12)  Fish Oil Concentrate 1000 Mg Caps (Omega-3 Fatty Acids) .... 3 Per Day 13)  Welchol 625 Mg Tabs (Colesevelam Hcl) .... Two By Mouth Three Times A Day 14)  Nitroglycerin 0.4 Mg Subl (Nitroglycerin) .... One Tablet Under Tongue Every 5 Minutes As Needed For Chest Pain---May Repeat Times Three  Allergies: 1)  ! * Statins 2)  ! Asa 3)  ! Pcn 4)  ! * Muscle Relaxants 5)  ! Levaquin 6)  ! * Lumigan 7)  ! Claritin 8)  ! Vioxx  Past History:  Past medical history reviewed for relevance to current acute and chronic problems.  Past Medical  History: Current Problems:  CORONARY ARTERY DISEASE (ICD-414.00)- stenting of the left circumflex in  2002.  HYPERTENSION, MODERATE (ICD-401.9) HYPERLIPIDEMIA (ICD-272.4) Hx of GERD (ICD-530.81) HYPOTHYROIDISM (ICD-244.9) UTI (ICD-599.0) DISORDER OF BONE AND CARTILAGE UNSPECIFIED (ICD-733.90) UNS ADVRS EFF UNS RX MEDICINAL&BIOLOGICAL SBSTNC (ICD-995.20) ADJUSTMENT DISORDER WITH ANXIOUS MOOD (ICD-309.24) MONILIASIS, SKIN/NAILS (ICD-112.3) FAMILY HISTORY OF CAD FEMALE 1ST DEGREE RELATIVE <60 (ICD-V16.49) IRRITABLE BOWEL SYNDROME, HX OF (ICD-V12.79) INTERNAL HEMORRHOIDS (ICD-455.0)  Review of Systems       Negative except as per HPI   Vital Signs:  Patient profile:   77 year old female Height:      59 inches Weight:      162.50 pounds BMI:     32.94 Pulse rate:   70 / minute Pulse rhythm:   regular Resp:     18 per minute BP sitting:   134 / 64  (left arm) Cuff size:   large  Vitals Entered By: Vikki Ports (February 01, 2010 10:29 AM)  Physical Exam  General:  Pt is alert and oriented, obese woman, in no acute distress. HEENT: normal Neck: normal carotid upstrokes without bruits, JVP normal Lungs: CTA CV: RRR without murmur or gallop Abd: soft, NT, positive BS, no bruit, no organomegaly Ext: no clubbing, cyanosis, or edema. peripheral pulses 2+ and equal. Right groin exam is unremarkable except for tenderness to palpation. Fem pulse is 2+ without a bruit  or mass. Skin: warm and dry without rash    EKG  Procedure date:  02/01/2010  Findings:      NSR, LAD, age-indeterminate Septal infarct, ST-T abnormality consider lateral ischemia - unchanged from past tracing.  Impression & Recommendations:  Problem # 1:  CORONARY ARTERY DISEASE (ICD-414.00) Stable without angina - continue medical therapy as outlined. Follow-up in one year. She is ASA allergic - 7 years out from PCI without further problems, so will not start plavix or other antiplatelet Rx at this  point.  Her updated medication list for this problem includes:    Toprol Xl 25 Mg Xr24h-tab (Metoprolol succinate) .Marland Kitchen... 1 once daily    Nitroglycerin 0.4 Mg Subl (Nitroglycerin) ..... One tablet under tongue every 5 minutes as needed for chest pain---may repeat times three  Orders: EKG w/ Interpretation (93000) Arterial Duplex Lower Extremity (Arterial Duplex Low)  Problem # 2:  HYPERTENSION, MODERATE (ICD-401.9) BP controlled, cont current Rx. Her updated medication list for this problem includes:    Maxzide-25 37.5-25 Mg Tabs (Triamterene-hctz) .Marland Kitchen... 1 once daily    Toprol Xl 25 Mg Xr24h-tab (Metoprolol succinate) .Marland Kitchen... 1 once daily  Orders: EKG w/ Interpretation (93000) Arterial Duplex Lower Extremity (Arterial Duplex Low)  BP today: 134/64 Prior BP: 138/70 (01/17/2010)  Prior 10 Yr Risk Heart Disease: N/A (03/07/2009)  Labs Reviewed: K+: 4.1 (01/17/2010) Creat: : 0.6 (01/17/2010)   Chol: 239 (01/17/2010)   HDL: 54.80 (01/17/2010)   LDL: DEL (02/15/2009)   TG: 96.0 (08/01/2009)  Problem # 3:  HYPERLIPIDEMIA (ICD-272.4) Intolerant to statins, zetia, and just about all lipid-lowering meds. continue welchol - reviewed diet and exercise measures for lipid-lowering. Her updated medication list for this problem includes:    Welchol 625 Mg Tabs (Colesevelam hcl) .Marland Kitchen..Marland Kitchen Two by mouth three times a day  CHOL: 239 (01/17/2010)   LDL: DEL (02/15/2009)   HDL: 54.80 (01/17/2010)   TG: 96.0 (08/01/2009) CHOL (goal): 200 (03/07/2009)   LDL (goal): 100 (03/07/2009)   HDL (goal): 40 (03/07/2009)   TG (goal): 150 (03/07/2009)  Problem # 4:  GROIN PAIN (ICD-789.09) Physical exam unremarkable. Check limited groin ultrasound to rule out vascular problem - pt with prior cath from right groin access.  Patient Instructions: 1)  Your physician recommends that you continue on your current medications as directed. Please refer to the Current Medication list given to you today. 2)  Your physician  wants you to follow-up in:   1 YEAR. You will receive a reminder letter in the mail two months in advance. If you don't receive a letter, please call our office to schedule the follow-up appointment. 3)  Dr Excell Seltzer recommends a duplex of the Right groin to evaluate groin pain.

## 2011-01-21 NOTE — Letter (Signed)
Summary: [facility]  [facility]   Imported By: Maryln Gottron 06/04/2010 13:52:19  _____________________________________________________________________  External Attachment:    Type:   Image     Comment:   External Document

## 2011-01-21 NOTE — Assessment & Plan Note (Signed)
Summary: sinuses and cough//slm   Vital Signs:  Patient profile:   77 year old female Height:      59 inches (149.86 cm) Weight:      159 pounds (72.27 kg) O2 Sat:      97 % on Room air Temp:     98.1 degrees F (36.72 degrees C) oral Pulse rate:   82 / minute BP sitting:   156 / 80  (left arm) Cuff size:   regular  Vitals Entered By: Josph Macho RMA (October 14, 2010 11:46 AM)  O2 Flow:  Room air CC: Sinus and cough w/phlegm (yellowish-green) X 1 week / CF Is Patient Diabetic? No   History of Present Illness: Patient is a 77 yo caucasian, nonsmoking female in today with a nearly 1 week history of congestion. Started with mild cough and nasal congestion and was improving over the end of last week but then over the weekend it got notably worse.  She has developed worsening PND, nasal congesiton, sinus pain and pressure notably in forehead and left maxillary pressure. Has chronic tinnitus, unchanged. 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 Cough is now productive of yellow phlegm. Cough can be severe enough to hurt her chest at times. No palp, minimal increase in SOB with exertion. Sleeping OK no f/c.  Current Medications (verified): 1)  Synthroid 125 Mcg Tabs (Levothyroxine Sodium) .Marland Kitchen.. 1 Once Daily 2)  Ditropan Xl 5 Mg Xr24h-Tab (Oxybutynin Chloride) .Marland Kitchen.. 1 Once Daily 3)  Premarin 0.625 Mg Tabs (Estrogens Conjugated) .Marland Kitchen.. 1 Once Daily 4)  Nexium 40 Mg Cpdr (Esomeprazole Magnesium) .Marland Kitchen.. 1 Two Times A Day 5)  Colace 50 Mg Caps (Docusate Sodium) .... 1/2 Once Daily 6)  Cvs Vitamin B-6 100 Mg Tabs (Pyridoxine Hcl) .Marland Kitchen.. 1 Once Daily 7)  B-12 500 Mcg Tabs (Cyanocobalamin) .Marland Kitchen.. 1 Once Daily 8)  Vitamin E 400 Unit Caps (Vitamin E) .Marland Kitchen.. 1 Once Daily 9)  Acidophilus  Caps (Lactobacillus) .Marland Kitchen.. 1 Once Daily 10)  Fish Oil Concentrate 1000 Mg Caps (Omega-3 Fatty Acids) .... 3 Per Day 11)  Welchol 3.75 Gm Pack (Colesevelam Hcl) .... in Juice or Water  Daily 12)  Nitroglycerin 0.4 Mg Subl (Nitroglycerin) .... One Tablet Under Tongue Every 5 Minutes As Needed For Chest Pain---May Repeat Times Three 13)  Atenolol-Chlorthalidone 50-25 Mg Tabs (Atenolol-Chlorthalidone) .... One By Mouth Daily To Replace The Maxide and The Metoprolol in Am  Allergies (verified): 1)  ! * Statins 2)  ! Asa 3)  ! Pcn 4)  ! * Muscle Relaxants 5)  ! Levaquin 6)  ! * Lumigan 7)  ! Claritin 8)  ! Vioxx  Past History:  Past medical history reviewed for relevance to current acute and chronic problems. Social history (including risk factors) reviewed for relevance to current acute and chronic problems.  Past Medical History: Reviewed history from 02/01/2010 and no changes required. Current Problems:  CORONARY ARTERY DISEASE (ICD-414.00)- stenting of the left circumflex in  2002.  HYPERTENSION, MODERATE (ICD-401.9) HYPERLIPIDEMIA (ICD-272.4) Hx of GERD (ICD-530.81) HYPOTHYROIDISM (ICD-244.9) UTI (ICD-599.0) DISORDER OF BONE AND CARTILAGE UNSPECIFIED (ICD-733.90) UNS ADVRS EFF UNS RX MEDICINAL&BIOLOGICAL SBSTNC (ICD-995.20) ADJUSTMENT DISORDER WITH ANXIOUS MOOD (ICD-309.24) MONILIASIS, SKIN/NAILS (ICD-112.3) FAMILY HISTORY OF CAD FEMALE 1ST DEGREE RELATIVE <60 (ICD-V16.49) IRRITABLE BOWEL SYNDROME, HX OF (ICD-V12.79) INTERNAL HEMORRHOIDS (ICD-455.0)  Social History: Reviewed history from 01/30/2010 and no changes required. Occupation:retired Married Never Smoked Alcohol use-no  Review of Systems  See HPI  Physical Exam  General:  Well-developed,well-nourished,in no acute distress; alert,appropriate and cooperative throughout examination Head:  Normocephalic and atraumatic without obvious abnormalities. No apparent alopecia or balding. Nose:  External nasal examination shows no deformity or inflammation. Nasal mucosa are pink and moist without lesions or exudates. Mouth:  Oral mucosa and oropharynx without lesions or exudates.  Teeth in good  repair. Neck:  No deformities, masses, or tenderness noted. Lungs:  Normal respiratory effort, chest expands symmetrically. Lungs are clear to auscultation, no crackles or wheezes. Heart:  Normal rate and regular rhythm. S1 and S2 normal without gallop, murmur, click, rub or other extra sounds. Abdomen:  Bowel sounds positive,abdomen soft and non-tender without masses, organomegaly or hernias noted. Extremities:  No clubbing, cyanosis, edema, or deformity noted with normal full range of motion of all joints.   Cervical Nodes:  No lymphadenopathy noted Psych:  Cognition and judgment appear intact. Alert and cooperative with normal attention span and concentration. No apparent delusions, illusions, hallucinations   Impression & Recommendations:  Problem # 1:  ACUTE MAXILLARY SINUSITIS (ICD-461.0)  Her updated medication list for this problem includes:    Zithromax 250 Mg Tabs (Azithromycin) .Marland Kitchen... 2 tabs by mouth once and then 1 tab by mouth daily x 4 days Mucinex 600mg  two times a day, push clear fluids and increase rest  Problem # 2:  HYPERTENSION, MODERATE (ICD-401.9)  Her updated medication list for this problem includes:    Atenolol-chlorthalidone 50-25 Mg Tabs (Atenolol-chlorthalidone) ..... One by mouth daily to replace the maxide and the metoprolol in am Up slightly with acute illness, patient asked to check it at home as she feels better notify us if it continues to run  Complete Medication List: 1)  Synthroid 125 Mcg Tabs (Levothyroxine sodium) .Marland Kitchen.. 1 once daily 2)  Ditropan Xl 5 Mg Xr24h-tab (Oxybutynin chloride) .Marland Kitchen.. 1 once daily 3)  Premarin 0.625 Mg Tabs (Estrogens conjugated) .Marland Kitchen.. 1 once daily 4)  Nexium 40 Mg Cpdr (Esomeprazole magnesium) .Marland Kitchen.. 1 two times a day 5)  Colace 50 Mg Caps (Docusate sodium) .... 1/2 once daily 6)  Cvs Vitamin B-6 100 Mg Tabs (Pyridoxine hcl) .Marland Kitchen.. 1 once daily 7)  B-12 500 Mcg Tabs (Cyanocobalamin) .Marland Kitchen.. 1 once daily 8)  Vitamin E 400 Unit Caps  (Vitamin e) .Marland Kitchen.. 1 once daily 9)  Acidophilus Caps (Lactobacillus) .Marland Kitchen.. 1 once daily 10)  Fish Oil Concentrate 1000 Mg Caps (Omega-3 fatty acids) .... 3 per day 11)  Welchol 3.75 Gm Pack (Colesevelam hcl) .... In juice or water daily 12)  Nitroglycerin 0.4 Mg Subl (Nitroglycerin) .... One tablet under tongue every 5 minutes as needed for chest pain---may repeat times three 13)  Atenolol-chlorthalidone 50-25 Mg Tabs (Atenolol-chlorthalidone) .... One by mouth daily to replace the maxide and the metoprolol in am 14)  Zithromax 250 Mg Tabs (Azithromycin) .... 2 tabs by mouth once and then 1 tab by mouth daily x 4 days  Patient Instructions: 1)  Take 650 - 1000 mg of tylenol every 4-6 hours as needed for relief of pain or comfort of fever. Avoid taking more than 3000 mg in a 24 hour period( can cause liver damage in higher doses).  2)  Take your antibiotic as prescribed until ALL of it is gone, but stop if you develop a rash or swelling and contact our office as soon as possible.  3)  Acute sinusitis symptoms for less than 10 days are not helped by antibiotics. Use warm moist compresses, and over  the counter decongestants( only as directed). Call if no improvement in 5-7 days, sooner if increasing pain, fever, or new symptoms.  4)  Mucinex 600mg  by mouth two times a day x 10days Prescriptions: ZITHROMAX 250 MG TABS (AZITHROMYCIN) 2 tabs by mouth once and then 1 tab by mouth daily x 4 days  #6 x 1   Entered and Authorized by:   Danise Edge MD   Signed by:   Danise Edge MD on 10/14/2010   Method used:   Electronically to        [facility]  [phone]* (retail)       [address]       [place], [place]  [address]       Ph: [phone] or [phone]       Fax: [phone]   RxID:   [id number]    Orders Added: 1)  Est. Patient Level III [36644]

## 2011-01-21 NOTE — Assessment & Plan Note (Signed)
Summary: 3 month rov/njr   Vital Signs:  Patient profile:   77 year old female Height:      59 inches Weight:      160 pounds BMI:     32.43 Temp:     98.3 degrees F oral Pulse rate:   72 / minute Resp:     14 per minute BP sitting:   140 / 70  (left arm)  Vitals Entered By: Willy Eddy, LPN (August 07, 2010 10:35 AM) CC: roa Is Patient Diabetic? No   Primary Care Provider:  Stacie Glaze MD  CC:  roa.  History of Present Illness: saw doctor kimbrough and was referred to the PT for bladder training the PT questioned the use of a diuretic ( maxide) the pt has been on toprol and this has caused constipation blood pressure is well controlled gerd is stable stressors are controlled although care for husband and no driving status is "rough"  Preventive Screening-Counseling & Management  Alcohol-Tobacco     Smoking Status: quit     Year Quit: 1985     Passive Smoke Exposure: no     Tobacco Counseling: to remain off tobacco products  Problems Prior to Update: 1)  Groin Pain  (ICD-789.09) 2)  Coronary Artery Disease  (ICD-414.00) 3)  Hypertension, Moderate  (ICD-401.9) 4)  Hyperlipidemia  (ICD-272.4) 5)  Hx of Gerd  (ICD-530.81) 6)  Hypothyroidism  (ICD-244.9) 7)  Uti  (ICD-599.0) 8)  Disorder of Bone and Cartilage Unspecified  (ICD-733.90) 9)  Uns Advrs Eff Uns Rx Medicinal&biological Sbstnc  (ICD-995.20) 10)  Adjustment Disorder With Anxious Mood  (ICD-309.24) 11)  Moniliasis, Skin/nails  (ICD-112.3) 12)  Family History of Cad Female 1st Degree Relative <60  (ICD-V16.49) 13)  Irritable Bowel Syndrome, Hx of  (ICD-V12.79) 14)  Internal Hemorrhoids  (ICD-455.0)  Current Problems (verified): 1)  Groin Pain  (ICD-789.09) 2)  Coronary Artery Disease  (ICD-414.00) 3)  Hypertension, Moderate  (ICD-401.9) 4)  Hyperlipidemia  (ICD-272.4) 5)  Hx of Gerd  (ICD-530.81) 6)  Hypothyroidism  (ICD-244.9) 7)  Uti  (ICD-599.0) 8)  Disorder of Bone and Cartilage  Unspecified  (ICD-733.90) 9)  Uns Advrs Eff Uns Rx Medicinal&biological Sbstnc  (ICD-995.20) 10)  Adjustment Disorder With Anxious Mood  (ICD-309.24) 11)  Moniliasis, Skin/nails  (ICD-112.3) 12)  Family History of Cad Female 1st Degree Relative <60  (ICD-V16.49) 13)  Irritable Bowel Syndrome, Hx of  (ICD-V12.79) 14)  Internal Hemorrhoids  (ICD-455.0)  Medications Prior to Update: 1)  Synthroid 125 Mcg Tabs (Levothyroxine Sodium) .Marland Kitchen.. 1 Once Daily 2)  Maxzide-25 37.5-25 Mg Tabs (Triamterene-Hctz) .Marland Kitchen.. 1 Once Daily 3)  Toprol Xl 25 Mg Xr24h-Tab (Metoprolol Succinate) .Marland Kitchen.. 1 Once Daily 4)  Ditropan Xl 5 Mg Xr24h-Tab (Oxybutynin Chloride) .Marland Kitchen.. 1 Once Daily 5)  Premarin 0.625 Mg Tabs (Estrogens Conjugated) .Marland Kitchen.. 1 Once Daily 6)  Nexium 40 Mg Cpdr (Esomeprazole Magnesium) .Marland Kitchen.. 1 Two Times A Day 7)  Colace 50 Mg Caps (Docusate Sodium) .... 1/2 Once Daily 8)  Cvs Vitamin B-6 100 Mg Tabs (Pyridoxine Hcl) .Marland Kitchen.. 1 Once Daily 9)  B-12 500 Mcg Tabs (Cyanocobalamin) .Marland Kitchen.. 1 Once Daily 10)  Vitamin E 400 Unit Caps (Vitamin E) .Marland Kitchen.. 1 Once Daily 11)  Acidophilus  Caps (Lactobacillus) .Marland Kitchen.. 1 Once Daily 12)  Fish Oil Concentrate 1000 Mg Caps (Omega-3 Fatty Acids) .... 3 Per Day 13)  Welchol 625 Mg Tabs (Colesevelam Hcl) .... Two By Mouth Three Times A Day 14)  Nitroglycerin 0.4  Mg Subl (Nitroglycerin) .... One Tablet Under Tongue Every 5 Minutes As Needed For Chest Pain---May Repeat Times Three  Current Medications (verified): 1)  Synthroid 125 Mcg Tabs (Levothyroxine Sodium) .Marland Kitchen.. 1 Once Daily 2)  Ditropan Xl 5 Mg Xr24h-Tab (Oxybutynin Chloride) .Marland Kitchen.. 1 Once Daily 3)  Premarin 0.625 Mg Tabs (Estrogens Conjugated) .Marland Kitchen.. 1 Once Daily 4)  Nexium 40 Mg Cpdr (Esomeprazole Magnesium) .Marland Kitchen.. 1 Two Times A Day 5)  Colace 50 Mg Caps (Docusate Sodium) .... 1/2 Once Daily 6)  Cvs Vitamin B-6 100 Mg Tabs (Pyridoxine Hcl) .Marland Kitchen.. 1 Once Daily 7)  B-12 500 Mcg Tabs (Cyanocobalamin) .Marland Kitchen.. 1 Once Daily 8)  Vitamin E 400 Unit Caps  (Vitamin E) .Marland Kitchen.. 1 Once Daily 9)  Acidophilus  Caps (Lactobacillus) .Marland Kitchen.. 1 Once Daily 10)  Fish Oil Concentrate 1000 Mg Caps (Omega-3 Fatty Acids) .... 3 Per Day 11)  Welchol 3.75 Gm Pack (Colesevelam Hcl) .... in Juice or Water Daily 12)  Nitroglycerin 0.4 Mg Subl (Nitroglycerin) .... One Tablet Under Tongue Every 5 Minutes As Needed For Chest Pain---May Repeat Times Three 13)  Atenolol-Chlorthalidone 50-25 Mg Tabs (Atenolol-Chlorthalidone) .... One By Mouth Daily To Replace The Maxide and The Metoprolol in Am  Allergies (verified): 1)  ! * Statins 2)  ! Asa 3)  ! Pcn 4)  ! * Muscle Relaxants 5)  ! Levaquin 6)  ! * Lumigan 7)  ! Claritin 8)  ! Vioxx  Past History:  Family History: Last updated: 01/30/2010  There is a strong family history of multiple cancer  deaths in her sisters and brothers, from breast cancer, ovarian cancer.   Also a strong family history of heart disease with one of her sisters  dying from heart disease, the other one dying during coronary artery  bypass graft and another brother dying of heart disease.      Social History: Last updated: 01/30/2010 Occupation:retired Married Never Smoked Alcohol use-no  Risk Factors: Smoking Status: quit (08/07/2010) Passive Smoke Exposure: no (08/07/2010)  Past medical, surgical, family and social histories (including risk factors) reviewed, and no changes noted (except as noted below).  Past Medical History: Reviewed history from 02/01/2010 and no changes required. Current Problems:  CORONARY ARTERY DISEASE (ICD-414.00)- stenting of the left circumflex in  2002.  HYPERTENSION, MODERATE (ICD-401.9) HYPERLIPIDEMIA (ICD-272.4) Hx of GERD (ICD-530.81) HYPOTHYROIDISM (ICD-244.9) UTI (ICD-599.0) DISORDER OF BONE AND CARTILAGE UNSPECIFIED (ICD-733.90) UNS ADVRS EFF UNS RX MEDICINAL&BIOLOGICAL SBSTNC (ICD-995.20) ADJUSTMENT DISORDER WITH ANXIOUS MOOD (ICD-309.24) MONILIASIS, SKIN/NAILS (ICD-112.3) FAMILY HISTORY OF  CAD FEMALE 1ST DEGREE RELATIVE <60 (ICD-V16.49) IRRITABLE BOWEL SYNDROME, HX OF (ICD-V12.79) INTERNAL HEMORRHOIDS (ICD-455.0)  Past Surgical History: Reviewed history from 01/30/2010 and no changes required. stenting of the left circumflex in  2004.  Hysterectomy Cholecystectomy  Family History: Reviewed history from 01/30/2010 and no changes required.  There is a strong family history of multiple cancer  deaths in her sisters and brothers, from breast cancer, ovarian cancer.   Also a strong family history of heart disease with one of her sisters  dying from heart disease, the other one dying during coronary artery  bypass graft and another brother dying of heart disease.      Social History: Reviewed history from 01/30/2010 and no changes required. Occupation:retired Married Never Smoked Alcohol use-no  Review of Systems       The patient complains of decreased hearing.  The patient denies anorexia, fever, weight loss, weight gain, vision loss, hoarseness, chest pain, syncope, dyspnea on exertion, peripheral edema, prolonged  cough, headaches, hemoptysis, abdominal pain, melena, hematochezia, severe indigestion/heartburn, hematuria, incontinence, genital sores, muscle weakness, suspicious skin lesions, transient blindness, difficulty walking, depression, unusual weight change, abnormal bleeding, enlarged lymph nodes, angioedema, and breast masses.    Physical Exam  General:  Well-developed,well-nourished,in no acute distress; alert,appropriate and cooperative throughout examination Head:  Normocephalic and atraumatic without obvious abnormalities. No apparent alopecia or balding. Eyes:  pupils equal and pupils round.   Ears:  she has some cerumen right canal. No other acute changes Neck:  No deformities, masses, or tenderness noted. Lungs:  clear to auscultation throughout Heart:  regular rhythm and rate Abdomen:  Bowel sounds positive,abdomen soft and non-tender without masses,  organomegaly or hernias  slight tendernes sover teh right pelvic region Msk:  normal ROM and no joint swelling.     Impression & Recommendations:  Problem # 1:  HYPERTENSION, MODERATE (ICD-401.9) discussedhome monitering with new medications this may help the ankle sweeling The following medications were removed from the medication list:    Maxzide-25 37.5-25 Mg Tabs (Triamterene-hctz) .Marland Kitchen... 1 once daily    Toprol Xl 25 Mg Xr24h-tab (Metoprolol succinate) .Marland Kitchen... 1 once daily Her updated medication list for this problem includes:    Atenolol-chlorthalidone 50-25 Mg Tabs (Atenolol-chlorthalidone) ..... One by mouth daily to replace the maxide and the metoprolol in am  BP today: 140/70 Prior BP: 150/74 (05/07/2010)  Prior 10 Yr Risk Heart Disease: N/A (03/07/2009)  Labs Reviewed: K+: 4.1 (01/17/2010) Creat: : 0.6 (01/17/2010)   Chol: 239 (01/17/2010)   HDL: 54.80 (01/17/2010)   LDL: DEL (02/15/2009)   TG: 96.0 (08/01/2009)  Problem # 2:  HYPERLIPIDEMIA (ICD-272.4) constipations Her updated medication list for this problem includes:    Welchol 3.75 Gm Pack (Colesevelam hcl) ..... In juice or water daily  Labs Reviewed: SGOT: 24 (08/01/2009)   SGPT: 17 (08/01/2009)  Lipid Goals: Chol Goal: 200 (03/07/2009)   HDL Goal: 40 (03/07/2009)   LDL Goal: 100 (03/07/2009)   TG Goal: 150 (03/07/2009)  Prior 10 Yr Risk Heart Disease: N/A (03/07/2009)   HDL:54.80 (01/17/2010), 49.20 (08/01/2009)  LDL:DEL (02/15/2009)  Chol:239 (01/17/2010), 223 (08/01/2009)  Trig:96.0 (08/01/2009), 110.0 (05/31/2009)  Problem # 3:  ADJUSTMENT DISORDER WITH ANXIOUS MOOD (ICD-309.24) due to home situation  Complete Medication List: 1)  Synthroid 125 Mcg Tabs (Levothyroxine sodium) .Marland Kitchen.. 1 once daily 2)  Ditropan Xl 5 Mg Xr24h-tab (Oxybutynin chloride) .Marland Kitchen.. 1 once daily 3)  Premarin 0.625 Mg Tabs (Estrogens conjugated) .Marland Kitchen.. 1 once daily 4)  Nexium 40 Mg Cpdr (Esomeprazole magnesium) .Marland Kitchen.. 1 two times a day 5)   Colace 50 Mg Caps (Docusate sodium) .... 1/2 once daily 6)  Cvs Vitamin B-6 100 Mg Tabs (Pyridoxine hcl) .Marland Kitchen.. 1 once daily 7)  B-12 500 Mcg Tabs (Cyanocobalamin) .Marland Kitchen.. 1 once daily 8)  Vitamin E 400 Unit Caps (Vitamin e) .Marland Kitchen.. 1 once daily 9)  Acidophilus Caps (Lactobacillus) .Marland Kitchen.. 1 once daily 10)  Fish Oil Concentrate 1000 Mg Caps (Omega-3 fatty acids) .... 3 per day 11)  Welchol 3.75 Gm Pack (Colesevelam hcl) .... In juice or water daily 12)  Nitroglycerin 0.4 Mg Subl (Nitroglycerin) .... One tablet under tongue every 5 minutes as needed for chest pain---may repeat times three 13)  Atenolol-chlorthalidone 50-25 Mg Tabs (Atenolol-chlorthalidone) .... One by mouth daily to replace the maxide and the metoprolol in am  Patient Instructions: 1)  Please schedule a follow-up appointment in 3 months. Prescriptions: WELCHOL 3.75 GM PACK (COLESEVELAM HCL) in juice or water daily  #30 pks  x 7   Entered and Authorized by:   Stacie Glaze MD   Signed by:   Stacie Glaze MD on 08/07/2010   Method used:   Electronically to        [facility]  [phone]* (retail)       [address]       [place], [place]  [address]       Ph: [phone] or [phone]       Fax: [phone]   RxID:   [id number] ATENOLOL-CHLORTHALIDONE 50-25 MG TABS (ATENOLOL-CHLORTHALIDONE) one by mouth daily to replace the maxide and the metoprolol in AM  #30 x 3   Entered and Authorized by:   Stacie Glaze MD   Signed by:   Stacie Glaze MD on 08/07/2010   Method used:   Electronically to        [facility]  [phone]* (retail)       [address]       [place], [place]  [address]       Ph: [phone] or [phone]       Fax: [phone]   RxID:   [id number] SYNTHROID 125 MCG TABS (LEVOTHYROXINE SODIUM) 1 once daily  #90 x 3   Entered by:   Willy Eddy, LPN   Authorized by:   Stacie Glaze MD   Signed by:   Willy Eddy, LPN on  42/70/6237   Method used:   Print then Mail to Patient   RxID:   [id number]

## 2011-01-21 NOTE — Assessment & Plan Note (Signed)
Summary: 3 mo/rov/cb   Vital Signs:  Patient profile:   77 year old female Height:      59 inches Weight:      161 pounds BMI:     32.64 Temp:     98.2 degrees F oral Pulse rate:   68 / minute Resp:     14 per minute BP sitting:   132 / 80  (left arm)  Vitals Entered By: Willy Eddy, LPN (November 08, 2010 9:27 AM) CC: roa, Hypertension Management Is Patient Diabetic? No   Primary Care Provider:  Stacie Glaze MD  CC:  roa and Hypertension Management.  History of Present Illness: has a lumb removed from ear three years ago and the site was examed by Dr Perley Jain and she was recommended to return to the dermatologist the site has a recurring scab  still has a sinus infection (  cold) and wax in her ear she is on the new blood pressure medications the welchol is well tolerated and she feels that it has helped the constipation   Hypertension History:      Positive major cardiovascular risk factors include female age 29 years old or older, hyperlipidemia, and hypertension.  Negative major cardiovascular risk factors include negative family history for ischemic heart disease and non-tobacco-user status.        Positive history for target organ damage include ASHD (either angina/prior MI/prior CABG).  Further assessment for target organ damage reveals no history of stroke/TIA or peripheral vascular disease.     Preventive Screening-Counseling & Management  Alcohol-Tobacco     Smoking Status: quit     Year Quit: 1985     Passive Smoke Exposure: no     Tobacco Counseling: to remain off tobacco products  Problems Prior to Update: 1)  Acute Maxillary Sinusitis  (ICD-461.0) 2)  Groin Pain  (ICD-789.09) 3)  Coronary Artery Disease  (ICD-414.00) 4)  Hypertension, Moderate  (ICD-401.9) 5)  Hyperlipidemia  (ICD-272.4) 6)  Hx of Gerd  (ICD-530.81) 7)  Hypothyroidism  (ICD-244.9) 8)  Uti  (ICD-599.0) 9)  Disorder of Bone and Cartilage Unspecified  (ICD-733.90) 10)  Uns Advrs  Eff Uns Rx Medicinal&biological Sbstnc  (ICD-995.20) 11)  Adjustment Disorder With Anxious Mood  (ICD-309.24) 12)  Moniliasis, Skin/nails  (ICD-112.3) 13)  Family History of Cad Female 1st Degree Relative <60  (ICD-V16.49) 14)  Irritable Bowel Syndrome, Hx of  (ICD-V12.79) 15)  Internal Hemorrhoids  (ICD-455.0)  Current Problems (verified): 1)  Acute Maxillary Sinusitis  (ICD-461.0) 2)  Groin Pain  (ICD-789.09) 3)  Coronary Artery Disease  (ICD-414.00) 4)  Hypertension, Moderate  (ICD-401.9) 5)  Hyperlipidemia  (ICD-272.4) 6)  Hx of Gerd  (ICD-530.81) 7)  Hypothyroidism  (ICD-244.9) 8)  Uti  (ICD-599.0) 9)  Disorder of Bone and Cartilage Unspecified  (ICD-733.90) 10)  Uns Advrs Eff Uns Rx Medicinal&biological Sbstnc  (ICD-995.20) 11)  Adjustment Disorder With Anxious Mood  (ICD-309.24) 12)  Moniliasis, Skin/nails  (ICD-112.3) 13)  Family History of Cad Female 1st Degree Relative <60  (ICD-V16.49) 14)  Irritable Bowel Syndrome, Hx of  (ICD-V12.79) 15)  Internal Hemorrhoids  (ICD-455.0)  Medications Prior to Update: 1)  Synthroid 125 Mcg Tabs (Levothyroxine Sodium) .Marland Kitchen.. 1 Once Daily 2)  Ditropan Xl 5 Mg Xr24h-Tab (Oxybutynin Chloride) .Marland Kitchen.. 1 Once Daily 3)  Premarin 0.625 Mg Tabs (Estrogens Conjugated) .Marland Kitchen.. 1 Once Daily 4)  Nexium 40 Mg Cpdr (Esomeprazole Magnesium) .Marland Kitchen.. 1 Two Times A Day 5)  Colace 50 Mg Caps (Docusate  Sodium) .... 1/2 Once Daily 6)  Cvs Vitamin B-6 100 Mg Tabs (Pyridoxine Hcl) .Marland Kitchen.. 1 Once Daily 7)  B-12 500 Mcg Tabs (Cyanocobalamin) .Marland Kitchen.. 1 Once Daily 8)  Vitamin E 400 Unit Caps (Vitamin E) .Marland Kitchen.. 1 Once Daily 9)  Acidophilus  Caps (Lactobacillus) .Marland Kitchen.. 1 Once Daily 10)  Fish Oil Concentrate 1000 Mg Caps (Omega-3 Fatty Acids) .... 3 Per Day 11)  Welchol 3.75 Gm Pack (Colesevelam Hcl) .... in Juice or Water Daily 12)  Nitroglycerin 0.4 Mg Subl (Nitroglycerin) .... One Tablet Under Tongue Every 5 Minutes As Needed For Chest Pain---May Repeat Times Three 13)   Atenolol-Chlorthalidone 50-25 Mg Tabs (Atenolol-Chlorthalidone) .... One By Mouth Daily To Replace The Maxide and The Metoprolol in Am 14)  Zithromax 250 Mg Tabs (Azithromycin) .... 2 Tabs By Mouth Once and Then 1 Tab By Mouth Daily X 4 Days  Current Medications (verified): 1)  Synthroid 125 Mcg Tabs (Levothyroxine Sodium) .Marland Kitchen.. 1 Once Daily 2)  Ditropan Xl 5 Mg Xr24h-Tab (Oxybutynin Chloride) .Marland Kitchen.. 1 Once Daily 3)  Premarin 0.625 Mg Tabs (Estrogens Conjugated) .Marland Kitchen.. 1 Once Daily 4)  Nexium 40 Mg Cpdr (Esomeprazole Magnesium) .Marland Kitchen.. 1 Two Times A Day 5)  Colace 50 Mg Caps (Docusate Sodium) .... 1/2 Once Daily 6)  Cvs Vitamin B-6 100 Mg Tabs (Pyridoxine Hcl) .Marland Kitchen.. 1 Once Daily 7)  B-12 500 Mcg Tabs (Cyanocobalamin) .Marland Kitchen.. 1 Once Daily 8)  Vitamin E 400 Unit Caps (Vitamin E) .Marland Kitchen.. 1 Once Daily 9)  Acidophilus  Caps (Lactobacillus) .Marland Kitchen.. 1 Once Daily 10)  Fish Oil Concentrate 1000 Mg Caps (Omega-3 Fatty Acids) .... 3 Per Day 11)  Welchol 3.75 Gm Pack (Colesevelam Hcl) .... in Juice or Water Daily 12)  Nitroglycerin 0.4 Mg Subl (Nitroglycerin) .... One Tablet Under Tongue Every 5 Minutes As Needed For Chest Pain---May Repeat Times Three 13)  Atenolol-Chlorthalidone 50-25 Mg Tabs (Atenolol-Chlorthalidone) .... One By Mouth Daily To Replace The Maxide and The Metoprolol in Am  Allergies (verified): 1)  ! * Statins 2)  ! Asa 3)  ! Pcn 4)  ! * Muscle Relaxants 5)  ! Levaquin 6)  ! * Lumigan 7)  ! Claritin 8)  ! Vioxx  Past History:  Family History: Last updated: 01/30/2010  There is a strong family history of multiple cancer  deaths in her sisters and brothers, from breast cancer, ovarian cancer.   Also a strong family history of heart disease with one of her sisters  dying from heart disease, the other one dying during coronary artery  bypass graft and another brother dying of heart disease.      Social History: Last updated: 01/30/2010 Occupation:retired Married Never Smoked Alcohol  use-no  Risk Factors: Smoking Status: quit (11/08/2010) Passive Smoke Exposure: no (11/08/2010)  Past medical, surgical, family and social histories (including risk factors) reviewed, and no changes noted (except as noted below).  Past Medical History: Reviewed history from 02/01/2010 and no changes required. Current Problems:  CORONARY ARTERY DISEASE (ICD-414.00)- stenting of the left circumflex in  2002.  HYPERTENSION, MODERATE (ICD-401.9) HYPERLIPIDEMIA (ICD-272.4) Hx of GERD (ICD-530.81) HYPOTHYROIDISM (ICD-244.9) UTI (ICD-599.0) DISORDER OF BONE AND CARTILAGE UNSPECIFIED (ICD-733.90) UNS ADVRS EFF UNS RX MEDICINAL&BIOLOGICAL SBSTNC (ICD-995.20) ADJUSTMENT DISORDER WITH ANXIOUS MOOD (ICD-309.24) MONILIASIS, SKIN/NAILS (ICD-112.3) FAMILY HISTORY OF CAD FEMALE 1ST DEGREE RELATIVE <60 (ICD-V16.49) IRRITABLE BOWEL SYNDROME, HX OF (ICD-V12.79) INTERNAL HEMORRHOIDS (ICD-455.0)  Past Surgical History: Reviewed history from 01/30/2010 and no changes required. stenting of the left circumflex in  2004.  Hysterectomy  Cholecystectomy  Family History: Reviewed history from 01/30/2010 and no changes required.  There is a strong family history of multiple cancer  deaths in her sisters and brothers, from breast cancer, ovarian cancer.   Also a strong family history of heart disease with one of her sisters  dying from heart disease, the other one dying during coronary artery  bypass graft and another brother dying of heart disease.      Social History: Reviewed history from 01/30/2010 and no changes required. Occupation:retired Married Never Smoked Alcohol use-no  Review of Systems  The patient denies anorexia, fever, weight loss, weight gain, vision loss, decreased hearing, hoarseness, chest pain, syncope, dyspnea on exertion, peripheral edema, prolonged cough, headaches, hemoptysis, abdominal pain, melena, hematochezia, severe indigestion/heartburn, hematuria, incontinence, genital  sores, muscle weakness, suspicious skin lesions, transient blindness, difficulty walking, depression, unusual weight change, abnormal bleeding, enlarged lymph nodes, angioedema, and breast masses.    Physical Exam  General:  Well-developed,well-nourished,in no acute distress; alert,appropriate and cooperative throughout examination Eyes:  pupils equal and pupils round.   Ears:  she has some cerumen right canal. No other acute changes Nose:  External nasal examination shows no deformity or inflammation. Nasal mucosa are pink and moist without lesions or exudates. Mouth:  Oral mucosa and oropharynx without lesions or exudates.  Teeth in good repair. Neck:  No deformities, masses, or tenderness noted. Lungs:  Normal respiratory effort, chest expands symmetrically. Lungs are clear to auscultation, no crackles or wheezes. Heart:  Normal rate and regular rhythm. S1 and S2 normal without gallop, murmur, click, rub or other extra sounds. Abdomen:  Bowel sounds positive,abdomen soft and non-tender without masses, organomegaly or hernias noted. Msk:  normal ROM and no joint swelling.   Extremities:  No clubbing, cyanosis, edema, or deformity noted with normal full range of motion of all joints.   Neurologic:  alert & oriented X3, gait normal, and DTRs symmetrical and normal.     Impression & Recommendations:  Problem # 1:  HYPERTENSION, MODERATE (ICD-401.9) stable Her updated medication list for this problem includes:    Atenolol-chlorthalidone 50-25 Mg Tabs (Atenolol-chlorthalidone) ..... One by mouth daily to replace the maxide and the metoprolol in am  BP today: 132/80 Prior BP: 156/80 (10/14/2010)  Prior 10 Yr Risk Heart Disease: N/A (03/07/2009)  Labs Reviewed: K+: 4.1 (01/17/2010) Creat: : 0.6 (01/17/2010)   Chol: 239 (01/17/2010)   HDL: 54.80 (01/17/2010)   LDL: DEL (02/15/2009)   TG: 96.0 (08/01/2009)  Problem # 2:  HYPERLIPIDEMIA (ICD-272.4) tolrated the luid well Her updated  medication list for this problem includes:    Welchol 3.75 Gm Pack (Colesevelam hcl) ..... In juice or water daily  Labs Reviewed: SGOT: 24 (08/01/2009)   SGPT: 17 (08/01/2009)  Lipid Goals: Chol Goal: 200 (03/07/2009)   HDL Goal: 40 (03/07/2009)   LDL Goal: 100 (03/07/2009)   TG Goal: 150 (03/07/2009)  Prior 10 Yr Risk Heart Disease: N/A (03/07/2009)   HDL:54.80 (01/17/2010), 49.20 (08/01/2009)  LDL:DEL (02/15/2009)  Chol:239 (01/17/2010), 223 (08/01/2009)  Trig:96.0 (08/01/2009), 110.0 (05/31/2009)  Problem # 3:  HYPOTHYROIDISM (ICD-244.9)  Her updated medication list for this problem includes:    Synthroid 125 Mcg Tabs (Levothyroxine sodium) .Marland Kitchen... 1 once daily  Labs Reviewed: TSH: 0.63 (01/17/2010)   Total T4: 10.7 (01/15/2009)    Chol: 239 (01/17/2010)   HDL: 54.80 (01/17/2010)   LDL: DEL (02/15/2009)   TG: 96.0 (08/01/2009)  Problem # 4:  ADJUSTMENT DISORDER WITH ANXIOUS MOOD (ICD-309.24) due to the diagnosis  of her husband spent time urging councilling and support groups  Complete Medication List: 1)  Synthroid 125 Mcg Tabs (Levothyroxine sodium) .Marland Kitchen.. 1 once daily 2)  Ditropan Xl 5 Mg Xr24h-tab (Oxybutynin chloride) .Marland Kitchen.. 1 once daily 3)  Premarin 0.625 Mg Tabs (Estrogens conjugated) .Marland Kitchen.. 1 once daily 4)  Nexium 40 Mg Cpdr (Esomeprazole magnesium) .Marland Kitchen.. 1 two times a day 5)  Colace 50 Mg Caps (Docusate sodium) .... 1/2 once daily 6)  Cvs Vitamin B-6 100 Mg Tabs (Pyridoxine hcl) .Marland Kitchen.. 1 once daily 7)  B-12 500 Mcg Tabs (Cyanocobalamin) .Marland Kitchen.. 1 once daily 8)  Vitamin E 400 Unit Caps (Vitamin e) .Marland Kitchen.. 1 once daily 9)  Acidophilus Caps (Lactobacillus) .Marland Kitchen.. 1 once daily 10)  Fish Oil Concentrate 1000 Mg Caps (Omega-3 fatty acids) .... 3 per day 11)  Welchol 3.75 Gm Pack (Colesevelam hcl) .... In juice or water daily 12)  Nitroglycerin 0.4 Mg Subl (Nitroglycerin) .... One tablet under tongue every 5 minutes as needed for chest pain---may repeat times three 13)   Atenolol-chlorthalidone 50-25 Mg Tabs (Atenolol-chlorthalidone) .... One by mouth daily to replace the maxide and the metoprolol in am  Hypertension Assessment/Plan:      The patient's hypertensive risk group is category C: Target organ damage and/or diabetes.  Today's blood pressure is 132/80.  Her blood pressure goal is < 140/90.  Patient Instructions: 1)  Please schedule a follow-up appointment in 3 months.   Orders Added: 1)  Est. Patient Level IV [82956]

## 2011-01-23 NOTE — Letter (Signed)
Summary: [facility]  [facility]   Imported By: Maryln Gottron 12/06/2010 12:34:59  _____________________________________________________________________  External Attachment:    Type:   Image     Comment:   External Document

## 2011-02-14 ENCOUNTER — Encounter: Payer: Self-pay | Admitting: Cardiovascular Disease

## 2011-02-14 ENCOUNTER — Ambulatory Visit (INDEPENDENT_AMBULATORY_CARE_PROVIDER_SITE_OTHER): Payer: Medicare Other | Admitting: Cardiovascular Disease

## 2011-02-14 DIAGNOSIS — I10 Essential (primary) hypertension: Secondary | ICD-10-CM

## 2011-02-14 DIAGNOSIS — I2510 Atherosclerotic heart disease of native coronary artery without angina pectoris: Secondary | ICD-10-CM

## 2011-02-20 ENCOUNTER — Encounter: Payer: Self-pay | Admitting: Internal Medicine

## 2011-02-21 ENCOUNTER — Ambulatory Visit (INDEPENDENT_AMBULATORY_CARE_PROVIDER_SITE_OTHER): Payer: Medicare Other | Admitting: Internal Medicine

## 2011-02-21 ENCOUNTER — Other Ambulatory Visit: Payer: Self-pay | Admitting: *Deleted

## 2011-02-21 ENCOUNTER — Encounter: Payer: Self-pay | Admitting: Internal Medicine

## 2011-02-21 DIAGNOSIS — F4322 Adjustment disorder with anxiety: Secondary | ICD-10-CM

## 2011-02-21 DIAGNOSIS — I10 Essential (primary) hypertension: Secondary | ICD-10-CM

## 2011-02-21 DIAGNOSIS — Z0000 Encounter for general adult medical examination without abnormal findings: Secondary | ICD-10-CM | POA: Insufficient documentation

## 2011-02-21 DIAGNOSIS — M545 Low back pain: Secondary | ICD-10-CM | POA: Insufficient documentation

## 2011-02-21 MED ORDER — ESTROGENS CONJUGATED 0.625 MG PO TABS: 0.6250 mg | ORAL_TABLET | Freq: Every day | ORAL | Status: DC

## 2011-02-21 MED ORDER — NITROGLYCERIN 0.4 MG SL SUBL: 0.4000 mg | SUBLINGUAL_TABLET | SUBLINGUAL | Status: DC | PRN

## 2011-02-21 NOTE — Assessment & Plan Note (Signed)
Pressure stable on current medication she denies any chest pain shortness of breath

## 2011-02-21 NOTE — Progress Notes (Signed)
Subjective:    Patient ID: Joyce [name], female    DOB: 1931/12/17, 77 y.o.   MRN: [id number]  HPI  this 77 year old white female who presents for followup of hypertension hyperlipidemia reflux and reactive depression to dealing with her husband with Alzheimer's she has been compliant with her medication she denies any chest pain new shortness of breath PND orthopnea and edema.  she has a new recent complaint of radicular type back pain from her lumbar spine radiating down to her left foot she evaluated by orthopedics and is currently in a physical therapy program for this pain   Review of Systems  Constitutional: Negative for activity change, appetite change and fatigue.  HENT: Negative for ear pain, congestion, neck pain, postnasal drip and sinus pressure.   Eyes: Negative for redness and visual disturbance.  Respiratory: Negative for cough, shortness of breath and wheezing.   Gastrointestinal: Negative for abdominal pain and abdominal distention.  Genitourinary: Negative for dysuria, frequency and menstrual problem.  Musculoskeletal: Negative for myalgias, joint swelling and arthralgias.  Skin: Negative for rash and wound.  Neurological: Negative for dizziness, weakness and headaches.  Hematological: Negative for adenopathy. Does not bruise/bleed easily.  Psychiatric/Behavioral: Negative for sleep disturbance and decreased concentration.   Past Medical History  Diagnosis Date  . Coronary atherosclerosis of unspecified type of vessel, native or graft   . Unspecified essential hypertension   . Hyperlipidemia   . GERD (gastroesophageal reflux disease)   . Thyroid disease   . Urinary tract infection, site not specified   . Disorder of bone and cartilage, unspecified   . Unspecified adverse effect of unspecified drug, medicinal and biological substance   . Adjustment disorder with anxiety   . Candidiasis of skin and nails   . Family history of malignant neoplasm of genital organ,  other   . Personal history of other diseases of digestive system   . Internal hemorrhoids without mention of complication    Past Surgical History  Procedure Date  . Coronary stent placement   . Abdominal hysterectomy   . Cholecystectomy     reports that she has never smoked. She does not have any smokeless tobacco history on file. She reports that she does not drink alcohol or use illicit drugs. family history includes Cancer in an unspecified family member and Heart disease in an unspecified family member.         Objective:   Physical Exam  Constitutional: She is oriented to person, place, and time. She appears well-developed and well-nourished. No distress.  HENT:  Head: Normocephalic and atraumatic.  Right Ear: External ear normal.  Left Ear: External ear normal.  Nose: Nose normal.  Mouth/Throat: Oropharynx is clear and moist.  Eyes: Conjunctivae and EOM are normal. Pupils are equal, round, and reactive to light.  Neck: Normal range of motion. Neck supple. No JVD present. No tracheal deviation present. No thyromegaly present.  Cardiovascular: Normal rate, regular rhythm, normal heart sounds and intact distal pulses.   No murmur heard. Pulmonary/Chest: Effort normal and breath sounds normal. She has no wheezes. She exhibits no tenderness.  Abdominal: Soft. Bowel sounds are normal.  Musculoskeletal: Normal range of motion. She exhibits no edema and no tenderness.  Lymphadenopathy:    She has no cervical adenopathy.  Neurological: She is alert and oriented to person, place, and time. She has normal reflexes. No cranial nerve deficit.  Skin: Skin is warm and dry. She is not diaphoretic.  Psychiatric: She has a normal mood and  affect. Her behavior is normal.          Assessment & Plan:

## 2011-02-21 NOTE — Assessment & Plan Note (Signed)
Doing with her husband's illness has been difficult for her but we are hopeful that this new outpatient program for daycare for him we'll give her time to relax and regenerate

## 2011-02-21 NOTE — Assessment & Plan Note (Addendum)
seeing physical therapy

## 2011-02-21 NOTE — Assessment & Plan Note (Signed)
Shingles shot today.

## 2011-02-21 NOTE — Telephone Encounter (Signed)
error 

## 2011-03-04 NOTE — Assessment & Plan Note (Signed)
Summary: F1Y   Visit Type:  1 year follow up Primary Provider:  Stacie Glaze MD  CC:  No complaints.  History of Present Illness: Joyce [name] is a 77 year old woman with coronary artery disease who underwent stenting of the left circumflex in 2002. Relook cath in 2004 showed stent patency. She presents today for follow-up evaluation.  She has had a lot of social stressors since her last visit one year ago. This is been her most difficult issue over the past year. She denies chest pain, edema, palpitations, or exertional dyspnea. Her medications are unchanged.  Current Medications (verified): 1)  Synthroid 125 Mcg Tabs (Levothyroxine Sodium) .Marland Kitchen.. 1 Once Daily 2)  Ditropan Xl 5 Mg Xr24h-Tab (Oxybutynin Chloride) .Marland Kitchen.. 1 Once Daily 3)  Premarin 0.625 Mg Tabs (Estrogens Conjugated) .Marland Kitchen.. 1 Once Daily 4)  Nexium 40 Mg Cpdr (Esomeprazole Magnesium) .Marland Kitchen.. 1 Two Times A Day 5)  Colace 50 Mg Caps (Docusate Sodium) .... 1/2 Once Daily 6)  Cvs Vitamin B-6 100 Mg Tabs (Pyridoxine Hcl) .Marland Kitchen.. 1 Once Daily 7)  B-12 500 Mcg Tabs (Cyanocobalamin) .Marland Kitchen.. 1 Once Daily 8)  Vitamin E 400 Unit Caps (Vitamin E) .Marland Kitchen.. 1 Once Daily 9)  Acidophilus  Caps (Lactobacillus) .Marland Kitchen.. 1 Once Daily 10)  Fish Oil Concentrate 1000 Mg Caps (Omega-3 Fatty Acids) .... 3 Per Day 11)  Welchol 3.75 Gm Pack (Colesevelam Hcl) .... in Juice or Water Daily 12)  Nitroglycerin 0.4 Mg Subl (Nitroglycerin) .... One Tablet Under Tongue Every 5 Minutes As Needed For Chest Pain---May Repeat Times Three 13)  Atenolol-Chlorthalidone 50-25 Mg Tabs (Atenolol-Chlorthalidone) .... One By Mouth Daily To Replace The Maxide and The Metoprolol in Am  Allergies: 1)  ! * Statins 2)  ! Asa 3)  ! Pcn 4)  ! * Muscle Relaxants 5)  ! Levaquin 6)  ! * Lumigan 7)  ! Claritin 8)  ! Vioxx  Past History:  Past medical history reviewed for relevance to current acute and chronic problems.  Past Medical History: Reviewed history from 02/01/2010 and no changes  required. Current Problems:  CORONARY ARTERY DISEASE (ICD-414.00)- stenting of the left circumflex in  2002.  HYPERTENSION, MODERATE (ICD-401.9) HYPERLIPIDEMIA (ICD-272.4) Hx of GERD (ICD-530.81) HYPOTHYROIDISM (ICD-244.9) UTI (ICD-599.0) DISORDER OF BONE AND CARTILAGE UNSPECIFIED (ICD-733.90) UNS ADVRS EFF UNS RX MEDICINAL&BIOLOGICAL SBSTNC (ICD-995.20) ADJUSTMENT DISORDER WITH ANXIOUS MOOD (ICD-309.24) MONILIASIS, SKIN/NAILS (ICD-112.3) FAMILY HISTORY OF CAD FEMALE 1ST DEGREE RELATIVE <60 (ICD-V16.49) IRRITABLE BOWEL SYNDROME, HX OF (ICD-V12.79) INTERNAL HEMORRHOIDS (ICD-455.0)  Review of Systems       Negative except as per HPI   Vital Signs:  Patient profile:   77 year old female Height:      58 inches Weight:      155.50 pounds BMI:     32.62 Pulse rate:   54 / minute Pulse rhythm:   regular Resp:     18 per minute BP sitting:   150 / 68  (left arm) Cuff size:   large  Vitals Entered By: Vikki Ports (February 14, 2011 12:14 PM)  Physical Exam  General:  Pt is alert and oriented, overweight woman in no acute distress. HEENT: normal Neck: normal carotid upstrokes without bruits, JVP normal Lungs: CTA CV: RRR without murmur or gallop Abd: soft, NT, positive BS, no bruit, no organomegaly Ext: no clubbing, cyanosis, or edema. peripheral pulses 2+ and equal Skin: warm and dry without rash    EKG  Procedure date:  02/14/2011  Findings:  Sinus bradycardia heart rate 54 beats per minute, old anteroseptal infarct, T wave abnormality consider lateral ischemia.  Impression & Recommendations:  Problem # 1:  CORONARY ARTERY DISEASE (ICD-414.00) The patient is stable without angina. Recommend continuing her current medical program without changes. Note she is intolerant to aspirin. Her updated medication list for this problem includes:    Nitroglycerin 0.4 Mg Subl (Nitroglycerin) ..... One tablet under tongue every 5 minutes as needed for chest pain---may  repeat times three    Atenolol-chlorthalidone 50-25 Mg Tabs (Atenolol-chlorthalidone) ..... One by mouth daily to replace the maxide and the metoprolol in am  Orders: EKG w/ Interpretation (93000)  Problem # 2:  HYPERLIPIDEMIA (ICD-272.4) She is intolerant to all statins. Lifestyle modification reviewed. Her updated medication list for this problem includes:    Welchol 3.75 Gm Pack (Colesevelam hcl) ..... In juice or water daily  CHOL: 239 (01/17/2010)   LDL: DEL (02/15/2009)   HDL: 54.80 (01/17/2010)   TG: 96.0 (08/01/2009) CHOL (goal): 200 (03/07/2009)   LDL (goal): 100 (03/07/2009)   HDL (goal): 40 (03/07/2009)   TG (goal): 150 (03/07/2009)  Problem # 3:  HYPERTENSION, MODERATE (ICD-401.9) office blood pressure elevated. Recommended home monitoring and followup with Dr. Lovell Sheehan. Her updated medication list for this problem includes:    Atenolol-chlorthalidone 50-25 Mg Tabs (Atenolol-chlorthalidone) ..... One by mouth daily to replace the maxide and the metoprolol in am  Orders: EKG w/ Interpretation (93000)  BP today: 150/68 Prior BP: 132/78 (11/13/2010)  Prior 10 Yr Risk Heart Disease: N/A (03/07/2009)  Labs Reviewed: K+: 4.1 (01/17/2010) Creat: : 0.6 (01/17/2010)   Chol: 239 (01/17/2010)   HDL: 54.80 (01/17/2010)   LDL: DEL (02/15/2009)   TG: 96.0 (08/01/2009)  Patient Instructions: 1)  Your physician recommends that you continue on your current medications as directed. Please refer to the Current Medication list given to you today. 2)  Your physician wants you to follow-up in: 1 YEAR.   You will receive a reminder letter in the mail two months in advance. If you don't receive a letter, please call our office to schedule the follow-up appointment.

## 2011-05-06 NOTE — Assessment & Plan Note (Signed)
[facility]                         GASTROENTEROLOGY OFFICE NOTE   NAME:[name], Joyce SUMMERSON                       MRN:          [id number]  DATE:07/15/2007                            DOB:          1931/10/08    Joyce [name] is a 77 year old white female who I previously evaluated. She  has a history of GERD with presumed LPR. In addition, she has a history  of irritable bowel syndrome with alternating diarrhea and constipation.  This has been associated with epigastric pain, gas and bloating. She had  symptoms previously in June 2003 and underwent colonoscopy at that time  which showed only internal hemorrhoids. Random colon biopsies were all  unremarkable. She was treated with Levsin and this had previously  relieved her symptoms. She has recently been using her Levsin again with  some relief in her gas, bloating, epigastric pain, and alternating bowel  habits. She took some of her husbands Robinul Forte which also was also  very effective in her controlling her symptoms. She notes no melena,  hematochezia, weight loss, nausea, or vomiting. She occasionally has  some mild heartburn, but her reflux symptoms are generally well  controlled on Protonix b.i.d.   CURRENT MEDICATIONS:  Listed on the chart, updated and reviewed.   MEDICATION ALLERGIES:  PENICILLIN, LEVAQUIN, CLARITIN, SURGICAL TAPE,  ASPIRIN, VIOXX, __________ EYE DROPS.   SOCIAL HISTORY:  Per the handwritten form.   REVIEW OF SYSTEMS:  Per the handwritten form.   PAST MEDICAL HISTORY:  Coronary artery disease status post stent  placement, intolerance to statin therapy, hypothyroidism, insomnia,  status post hysterectomy, status post cholecystectomy, GERD, internal  hemorrhoids, irritable bowel syndrome.   PHYSICAL EXAMINATION:  GENERAL:  Well developed, well nourished white  female in no acute distress.  VITAL SIGNS:  Height 4 feet 11 inches, weight 165.2 pounds, blood  pressure 130/72,  pulse 64 and regular.  HEENT:  Anicteric sclerae, oropharynx clear.  CHEST:  Clear to auscultation bilaterally.  CARDIAC:  Regular rate and rhythm without murmurs appreciated.  ABDOMEN:  Soft with minimal epigastric tenderness to deep palpation, no  rebound or guarding, no palpable organomegaly, masses, or hernias.  Normal active bowel sounds.  EXTREMITIES:  Without cyanosis, clubbing, or edema.  NEUROLOGIC:  Alert and oriented x3. Grossly non-focal.   ASSESSMENT AND PLAN:  1. Alternating diarrhea and constipation with associated epigastric      pain. Her symptoms have responded very well to antispasmodic      medications. She is advised to substantially increase her daily      fiber and fluid intake, and use a daily fiber supplement such as      Citrucel Clear or Benefiber. Begin Robinul Forte 1 p.o. b.i.d.      p.r.n. She may continue to use Levsin on a p.r.n. basis. If her      symptoms do not remain under good control, she will return for      further follow up.  2. GERD with presumed LPR. Continue Pantoprazole 40 mg p.o. b.i.d.     Joyce Petit T. Russella Dar, MD, [facility]  Electronically Signed  Joyce [name]  DD: 07/16/2007  DT: 07/18/2007  Job #: [id number]   cc:   Joyce [name], M.D.

## 2011-05-06 NOTE — Assessment & Plan Note (Signed)
[facility]                            CARDIOLOGY OFFICE NOTE   NAME:[name], Joyce URESTI                       MRN:          [id number]  DATE:02/10/2008                            DOB:          1931-11-23    Joyce [name] was seen in follow-up at the [facility] cardiology office on  February 10, 2008.  Joyce [name] is a  delightful 77 year old woman with  coronary artery disease.  She presented with angina back in 2004 and was  treated by Dr. Juanda Chance with a bare metal stent to the left circumflex.  She has had stable moderate stenosis of the LAD which has been unchanged  over serial catheterization procedures.  Her left heart catheterization  was in 2004 and she has really done well since that time.  She has had  no recent chest pain, orthopnea, PND or edema.  She has stable  exertional dyspnea which is unchanged over several years.  She denies  palpitations or other complaints.   MEDICATIONS:  Synthroid 125 mcg daily, triamterene/HCT 37.5/25 mg daily,  Toprol XL 25 mg daily, Premarin 0.625 mg daily, Welchol 625 mg two  t.i.d., multiple vitamins, Protonix 40 mg twice daily, Ditropan daily,  oxybutynin 15 mg daily, Xalatan eyedrops and Colace twice daily.   ALLERGIES:  Are multiple.  The list was reviewed and includes  PENICILLIN, LEVAQUIN,  CLARITIN, and SURGICAL TAPE,  LACTOSE,  ASPIRIN,  VIOXX.   On exam the patient is alert and oriented.  She is in no acute distress.  Weight 164.  Blood pressure is 140/74, heart rate 63, respiratory rate  16.  HEENT:  Normal.  NECK:  Normal carotid upstrokes without bruits.  Jugular venous pressure  is normal.  LUNGS are clear to auscultation bilaterally.  HEART:  Regular rate and rhythm, without murmurs or gallop.  ABDOMEN:  Soft, nontender no organomegaly.  EXTREMITIES:  No clubbing, cyanosis or edema.  Peripheral pulses intact  and equal throughout.   EKG shows normal sinus rhythm with age-indeterminate septal infarct  and  diffuse nonspecific T-wave abnormality.   ASSESSMENT:  1. Coronary artery disease status post left circumflex stenting 2004.      No signs of angina.  The patient is on an excellent medical regimen      with the exception of the absence of antiplatelet therapy.  In      reviewing her records it appears that she has been off of aspirin      for several years due to intolerance.  She is stable with no      evidence of recurrent cardiovascular events in the in the past 5      years and so I do not feel compelled to resume any therapy at      present but will have to continue to watch her closely as she may      gain long term benefit  from Plavix since she has aspirin      intolerance.  For now I will continue her on her other medications      without changes.  2. Hypertension.  Blood pressure under reasonable control on current      regimen and includes a beta blocker and diuretic.  3. Dyslipidemia.  She is followed by Dr. Penni [name] with most recent      lipid panel that I have on file from November 2007 demonstrating an      LDL cholesterol of 143 with a total cholesterol 228 and HDL 52.  I      assume that she has been statin intolerant as she currently takes      Welchol.   For follow-up I will see Joyce [name] back once yearly or sooner if  problems arise.     Joyce [name]. Joyce Seltzer, MD  Electronically Signed    MDC/MedQ  DD: 02/16/2008  DT: 02/17/2008  Job #: [id number]   cc:   Joyce [name], M.D.

## 2011-05-06 NOTE — H&P (Signed)
NAMEJANUS, VLCEK NO.:  [id number]   MEDICAL RECORD NO.:  [id number]          PATIENT TYPE:  INP   LOCATION:  0106                         FACILITY:  [facility]   PHYSICIAN:  Joyce Scott, MD     DATE OF BIRTH:  Jun 16, 1931   DATE OF ADMISSION:  03/07/2008  DATE OF DISCHARGE:                              HISTORY & PHYSICAL   PRIMARY MEDICAL DOCTOR:  Quita Skye. Artis Flock, M.D.   CARDIOLOGIST:  Veverly Fells. Excell Seltzer, M.D.   GASTROENTEROLOGIST:  Venita Lick. Russella Dar, MD, FACG, [facility].   CHIEF COMPLAINT:  Nausea, vomiting, intractable diarrhea and generalized  weakness.   HISTORY OF PRESENT ILLNESS:  Joyce [name] is a 77 year old Caucasian  pleasant female patient with extensive past medical history as indicated  below.  She was in her usual state of health until approximately a week  ago.  The patient indicates that one week back she had some pasta at a  restaurant.  Subsequently that night she had some right upper quadrant  discomfort and the next morning started having vomiting and diarrhea.  Since then, the patient says she has had intermittent vomiting up to two  to three times per day, which consists of foods or liquids consumed but  no coffee grounds or blood.  But her vomiting has subsided since  Saturday.  She has only been intermittently nauseous.  The patient,  however, has had persistent diarrhea multiple times which is watery,  greenish in color with question mucus in it but no blood or melena.  The  patient denies any abdominal pain, fever, chills or rigors.  For these  symptoms, the patient presented to the emergency room and was provided a  prescription for Zofran, Phenergan and Lomotil.  The patient says for a  short while the symptoms seemed to get better; however, they again  deteriorated.  She started having generalized weakness and blurred  vision.  For these symptoms, the patient presented to the emergency  room.  The patient had taken a course of   Z-Pak a month ago.  This was  for question ear infection.  There is no history of sickly contact.  There is no history of similar symptoms in the people who ate with her  the night the events started.   PAST MEDICAL HISTORY:  1. Coronary artery disease, status post bare metal stent, left      circumflex, in 2004.  2. Stable moderate stenosis, LAD.  3. Hypothyroidism.  4. Hypertension.  5. Hyperlipidemia.  6. Gastroesophageal reflux disease and laryngopharyngeal reflux.  7. Irritable bowel syndrome with alternate constipation and diarrhea.  8. Intolerance to multiple medications.  9. Internal hemorrhoids.  10.Question irregular heartbeat.  11.Glaucoma.  12.History of vertigo 3 months ago.   PAST SURGICAL HISTORY:  1. Hysterectomy.  2. Cholecystectomy.  3. Psychiatric history is anxiety.   ALLERGIES:  1. ASPIRIN.  2. PENICILLIN.  3. STATINS.  4. MUSCLE RELAXANTS.  5. LEVAQUIN.  6. XALATAN.  7. LUMIGAN.  8. SURGICAL TAPE.  9. CLARITIN.  10.VIOXX.   CURRENT MEDICATIONS:  1. Synthroid 125 mcg  p.o. daily.  2. Maxzide 37.5/25 one p.o. daily.  3. Toprol XL 25 mg p.o. daily.  4. Ditropan 15 mg p.o. daily.  5. Premarin 0.625 mg p.o. daily.  6. Protonix 40 mg p.o. twice daily.  7. Welchol 625 mg 2 to 3 times a day.  8. Colace 1/2 tablet p.o. daily.  9. B6.  10.B12.  11.Vitamin E.  12.Vitamin C.  13.Acidophilus.   FAMILY HISTORY:  There is a strong family history of multiple cancer  deaths in her sisters and brothers, from breast cancer, ovarian cancer.  Also a strong family history of heart disease with one of her sisters  dying from heart disease, the other one dying during coronary artery  bypass graft and another brother dying of heart disease.   SOCIAL HISTORY:  The patient is married.  Her spouse and daughter and  granddaughter are at the bedside.  The patient's spouse probably has  early dementia and bronchitis.  She is independent of activities of  daily  living.  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  She quit smoking 24 years ago.  Prior to that, she had a 30-  pack-year smoking history.  There is no history of alcohol or drug  abuse.   REVIEW OF SYSTEMS:  Fourteen systems reviewed and apart from history of  presenting illness is pertinent for sore tongue, mild headache.   PHYSICAL EXAMINATION:  GENERAL:  The patient is a moderately built and  nourished female, the patient in no obvious distress.  VITAL SIGNS:  Temperature 97.5 degrees Fahrenheit, blood pressure 159/54  mmHg, pulse 77 per minute regular, respirations 16 per minute,  saturating 99% on room air.  HEENT:  Nontraumatic, normocephalic.  Pupils equally reacting to light  and accommodation.  Oral cavity with dry oral mucosa.  Tongue appears  slightly reddish but no thrush.  NECK:  Supple.  No JVD or carotid bruit.  LYMPHATICS:  No lymphadenopathy.  BREAST:  Exam deferred.  RESPIRATORY:  Clear to auscultation.  CARDIOVASCULAR:  First and second heart sounds heard.  No third or  fourth heart sounds or murmurs.  ABDOMEN:  Obese.  No tenderness, rigidity, guarding or rebound.  No  organomegaly or mass appreciated.  Bowel sounds are normally heard.  CENTRAL NERVOUS SYSTEM:  The patient is awake, alert, oriented x3.  No  focal neurological deficits.  EXTREMITIES:  With no cyanosis, clubbing or edema.  Peripheral pulses  symmetrically felt.  SKIN:  Without any rashes.   LABORATORY DATA:  Stool culture from March 15 is negative.  Magnesium is  2.3, troponin 0.06.  Basic metabolic panel with sodium 132, potassium  2.7, chloride 101, bicarb 19, glucose 131, BUN 38, creatinine 1.46,  calcium of 8.6.  CBC with hemoglobin 17.1, hematocrit 49.5, white blood  cell 9.6, platelets 276.  Urinalysis on the 15th had 3 to 6 white blood  cells and a few bacteria.  Her creatinine on the 15th was 1.02.  Her x-  ray of the abdomen acute series on the 15th had a  nonobstructive bowel  gas pattern.  No evidence of acute cardiopulmonary disease on chest x-  ray.  EKG is normal sinus rhythm at 73 beats per minute with left axis  deviation with no acute changes.  It has nonspecific ST/T wave  abnormalities and prolonged Q-T of 590.   ASSESSMENT AND PLAN:  1. Nausea, vomiting and intractable diarrhea.  These are likely  secondary to acute viral gastroenteritis.  Differential diagnosis      includes  exacerbated episode of diarrheal form of irritable bowel      syndrome.  Will send stool for C. diff.  Will provide clear liquids      p.o., IV fluids, antiemetics.  If stool C. diff negative to      consider starting the patient on Imodium.  Will hold Welchol      temporarily secondary to her diarrhea.  2. Dehydration secondary to problem #1.  We will hold the patient's      diuretics and provide IV fluids and hydrate.  3. Hypokalemia.  We will replete in IV fluids, IV and p.o. and repeat      basic metabolic panel and follow.  4. Acute renal failure/prerenal, for IV fluid hydration.  Follow basic      metabolic panel.  5. Hypothyroid.  To continue Synthroid.  6. Coronary artery disease, which is asymptomatic.  To continue      Toprol.      Joyce Scott, MD  Electronically Signed     AH/MEDQ  D:  03/07/2008  T:  03/07/2008  Job:  [id number]   cc:   Quita Skye. Artis Flock, M.D.  Fax: [phone]   Veverly Fells. Excell Seltzer, MD  [address]  [place], [place] [address]   Venita Lick. Russella Dar, MD, FACG  [address]  [place]  [place] [address]

## 2011-05-06 NOTE — Discharge Summary (Signed)
NAMEVOLANDA, MANGINE NO.:  [id number]   MEDICAL RECORD NO.:  [id number]          PATIENT TYPE:  INP   LOCATION:  1501                         FACILITY:  [facility]   PHYSICIAN:  Isidor Holts, M.D.  DATE OF BIRTH:  Mar 31, 1931   DATE OF ADMISSION:  03/07/2008  DATE OF DISCHARGE:  03/10/2008                               DISCHARGE SUMMARY   PMD:  Dr. Bradd Canary.   PRIMARY CARDIOLOGIST:  Dr. Tonny Bollman.   PRIMARY GASTROENTEROLOGIST:  Dr. Claudette Head, [facility].   DISCHARGE DIAGNOSES:  1. Acute gastroenteritis, likely viral etiology.  2. Dehydration and acute renal insufficiency.  3. Electrolyte abnormalities secondary to #2 above.  4. Hypothyroidism.  5. Dyslipidemia.  6. Coronary artery disease.  7. Hypertension.  8. History of gastroesophageal reflux disease/laryngopharyngeal      reflux.  9. Irritable bowel syndrome.  10.Possible early urinary tract infection.   DISCHARGE MEDICATIONS:  1. Synthroid 125 mcg p.o. daily.  2. Maxzide 37.5/25 one p.o. daily.  3. Ditropan 15 mg p.o. daily.  4. Premarin 0.625 mg p.o. daily.  5. Toprol XL 25 mg p.o. daily.  6. K-Dur 20 mEq p.o. daily.  7. Welchol 625 mg p.o. t.i.d.  8. Ciprofloxacin 250 mg p.o. b.i.d. for 3 days only.  9. Protonix 40 mg p.o. b.i.d.  10.Vitamin E.  11.Vitamin B6.  12.Vitamin B12.  13.Acidophilus in pre-admission dosages.   Note:  The patient may resume Colace in pre-admission dosages from March 11, 2008.   PROCEDURES.:  Abdominal/chest x-ray dated March 05, 2008.  This showed  nonobstructive bowel gas pattern.  No evidence of acute cardiopulmonary  disease, stable haziness in the region of the left border unchanged from  2006 and likely related to pericardial fat.   CONSULTATIONS:  None.   ADMISSION HISTORY:  As in H&P notes of March 07, 2008.  Dictated by Dr.  Marcellus Scott.  However, in brief, this is a 77 year old female, with  known history of coronary artery disease  status post bare metal stent  left circumflex 2004 and stable moderate stenosis, LAD, hypothyroidism,  hypertension, Dyslipidemia, GERD, laryngopharyngeal reflux, irritable  bowel syndrome, internal hemorrhoids, glaucoma.  History of vertigo  approximately 3 months ago, who presents with a 1-week history of right  upper quadrant discomfort, vomiting and diarrhea, following ingestion of  Pasta at a restaurant.  At the time of presentation, vomiting had  subsided.  However, she continued to have diarrhea, watery, greenish in  color, with possible mucus, but no blood or melena.  She did not have  pyrexia.  She was seen in the emergency department on March 06, 2008  prescribed Zofran, Phenergan and Lomotil with only transient relief.  She now represents with generalized weakness and blurred vision.  Reportedly, she had taken a course of Z-Pak approximately 1 month ago  for possible ear infection.  Otherwise no other recent antibiotic  treatment.  The patient was admitted for further evaluation,  investigation and management.   CLINICAL COURSE.:  1. Acute gastroenteritis.  The precise etiology for this uncertain.  However, it appears to be viral.  The patient does describe      clustering of cases with several members of her family members      affected with transient diarrheal illness.  However, stool studies      done during this hospitalization were negative.  The patient never      had a pyrexia, and her white cell count remained within normal      limits.  She was managed with bowel rest, intravenous fluid      hydration,  antiemetics, proton pump inhibitors, with satisfactory      clinical response.  As of March 08 2008, she had no further      episodes of diarrhea.  We were able to commence her on oral intake      and advance diet slowly, and by March 10, 2008 she was able to      tolerate a regular diet with no deleterious consequences.  She has      remained asymptomatic  otherwise.   1. Dehydration/acute renal insufficiency.  The patient at the time of      presentation was dehydrated with a BUN of 38 and creatinine of      1.46.  This was adequately addressed with intravenous fluid      hydration, and we are pleased to note that as of March 10, 2008,      BUN was 7, with creatinine of 0.64, intravenous fluids were      therefore discontinued.  On March 10, 2008, BUN was 5, creatinine      0.60.   1. Hypertension.  The patient remained normotensive during the course      of her hospitalization, on pre-admission antihypertensives.  Her      diuretics were temporarily held because of dehydration.  However,      now that her hydration status has normalized, she has been      recommenced on her pre-admission antihypertensive, i.e. Maxzide,      and potassium supplements were added because of recurrent mild      hypokalemia.   1. Gastroesophageal reflux disease.  There were no problems referable      to this.  Patient continues on twice-daily proton pump inhibitor.   1. Coronary artery disease.  The patient remained asymptomatic from      this viewpoint.   1. Hypothyroidism.  She continues on pre-admission Synthroid, with no      change.   1. Possible early urinary tract infection.  The patient on March 10, 2008 in a.m. complained of burning discomfort on urination.      Urinalysis showed WBC too numerous to count, RBC 0-2, bacteria      rare.  Possibly, this may represent an early urinary tract      infection and may be associated with urinary catheterization which      was done at the beginning of the patient's hospitalization.  She      has been placed on a 3-day course of Ciprofloxacin.  Review of the      patient's allergy/sensitivities indicate that the patient is      allergic, or at least sensitive to [facility].  However, she assures      me white emphatically that she has been treated with Ciprofloxacin      several times the past,  without any problems.  This has dictated      the antibiotic choice.  DISPOSITION:  The patient was on March 10, 2008, considered sufficiently  clinically recovered and stable for discharge.  She was therefore  discharged.  Diet: Heart-healthy.  Activity as tolerated.   FOLLOWUP INSTRUCTIONS:  The patient is to follow up routinely with her  primary M.D., Dr. Bradd Canary and also with her primary cardiologist,  Dr. Tonny Bollman, and gastroenterologist Dr. Claudette Head, per prior  scheduled appointment.      Isidor Holts, M.D.  Electronically Signed     CO/MEDQ  D:  03/10/2008  T:  03/10/2008  Job:  [id number]   cc:   Quita Skye. Artis Flock, M.D.  Fax: [phone]   Veverly Fells. Excell Seltzer, MD  [address]  [place], [place] [address]   Venita Lick. Russella Dar, MD, FACG  [address]  [place]  [place] [address]

## 2011-05-06 NOTE — Assessment & Plan Note (Signed)
[facility] HEALTHCARE                            CARDIOLOGY OFFICE NOTE   NAME:[name] [name] WINCHEL                       MRN:          [id number]  DATE:02/15/2009                            DOB:          1931/06/12    REASON FOR VISIT:  Followup coronary artery disease.   HISTORY OF PRESENT ILLNESS:  [name] [name] is a 77 year old woman with  coronary artery disease who underwent stenting of the left circumflex in  2004.  She has done well over the last several years and has been  minimally symptomatic.  However, over the last 6 months, she has  developed episodes of chest pain that occur with emotional stress.  She  had been going through a difficult time with her husband's health.  She  reports left-sided and substernal chest pressure when she becomes angry  or upset.  She remains physically active and has no symptoms with  physical exertion.  She denies dyspnea, edema, orthopnea, PND, or  palpitations.  During one episode of chest pain, she took a sublingual  nitroglycerin that alleviated her symptoms.  She has only taken 1  sublingual nitroglycerin over the last several months.   MEDICATIONS:  1. Vitamin B6.  2. Vitamin B12.  3. Vitamin E.  4. Fish oil 1 g t.i.d.  5. Vitamin D.  6. Lamisil 250 mg daily.  7. Synthroid 125 mcg daily.  8. Triamterene HCT 37.5/25 mg daily.  9. Toprol-XL 25 mg daily.  10.Premarin 0.625 mg daily.  11.WelChol 625 mg 2 t.i.d.  12.Acidophilus.  13.Oxybutynin 15 mg daily.  14.Colace 1-2 daily.  15.Nexium 40 mg twice daily.   ALLERGIES:  PENICILLIN, LEVAQUIN, SURGICAL TAPE, ASPIRIN VIOXX, LACTOSE,  and the patient is STATIN intolerant.   PHYSICAL EXAMINATION:  GENERAL:  She is alert and oriented.  No acute  distress.  VITAL SIGNS:  Weight is 157 pounds, blood pressure 148/70, heart rate  56, respiratory rate 16.  HEENT:  Normal.  NECK:  Normal carotid upstrokes.  No bruits.  JVP normal.  LUNGS:  Clear bilaterally.  HEART:   Regular rate and rhythm.  No murmurs, rubs, or gallops.  ABDOMEN:  Soft, nontender, obese, no organomegaly.  EXTREMITIES:  No clubbing, cyanosis, or edema.  Peripheral pulses are  intact and equal.   EKG shows normal sinus rhythm with age indeterminate septal infarct, ST  and T-wave change, consider lateral ischemia.   ASSESSMENT:  1. Coronary artery disease with recent episodes of chest pain.  The      patient's history is indeterminate because she has no exertional      pain, but does have symptoms with emotional stress.  This raises      concern for recurrent symptomatic coronary artery disease.  We will      check an adenosine Myoview to rule out significant ischemia.  The      patient is aspirin intolerant because of the ulcers.  I have      discussed the use of Plavix, but she is unable to handle the cost      of this medication at  the present time.  We will therefore continue      with metoprolol and treatment of her hypertension and dyslipidemia.      I will contact the patient after the results of her stress test are      available.  2. Dyslipidemia.  Lipids were last checked in January 2009.  Followup      lipids and LFTs today.  Continue WelChol.  She is statin      intolerant.  3. Hypertension.  Continue current medical program which includes      triamterene/HCT and Toprol-XL.   For followup, I will see [name] [name] back in 1 year.  If there are  problems with her Myoview scan, we will consider further evaluation with  cardiac catheterization.     Veverly Fells. Excell Seltzer, MD     MDC/MedQ  DD: 02/15/2009  DT: 02/16/2009  Job #: [id number]   cc:   Stacie Glaze, MD

## 2011-05-09 NOTE — Discharge Summary (Signed)
[place]  Patient:    Joyce [name], Joyce [name] Visit Number: [id number] MRN: [id number]          Service Type: Attending:  Lewayne Bunting, M.D. [facility] Dictated by:   Tereso Newcomer, P.A.-C. Adm. Date:  08/31/01 Disc. Date: 09/01/01   CC:         Barbaraann Share, M.D. [facility] C. Andrey Campanile, M.D.   Discharge Summary  DATE OF BIRTH: 1931-08-19  REASON FOR ADMISSION: Angina pectoris.  DISCHARGE DIAGNOSES: 1. Coronary artery disease. 2. Status post stenting to the left circumflex this admission by    Dr. Charlies Constable. 3. Hyperlipoproteinemia. 4. Hypertension. 5. Strong family history of coronary artery disease.  PROCEDURES PERFORMED THIS ADMISSION: Cardiac catheterization by Dr. Charlies Constable on August 23, 2001, revealing 40% proximal LAD stenosis, 75% proximal circumflex stenosis, small OM-1 vessel with 90% stenosis, OM-2 with 50% stenosis, RCA with 40% proximal and mid stenosis, normal LV with an EF of 60%. As noted above, a stent was placed to the left circumflex with reduction of stenosis of 75% to 0%.  HOSPITAL COURSE: This 77 year old female was originally seen in the office on August 18, 2001, with complaints of chest pressure with exertion or anxiety. She also noted that she developed pressure when she climbed a hill.  The pressure would subside when she leveled off.  She had no rest or nocturnal pain.  Her family history is significant for coronary artery disease.  Her social history is significant for a history of tobacco use.  She quit 13 years ago.  On initial examination in the office her blood pressure is 136/86, normal sinus rhythm.  Neck without JVD or bruits. Heart: Regular rate and rhythm, S1, S2 normal, S4 present with a soft ejection murmur. Abdomen: Soft without mass. Extremities: No edema.  At that time, the patient was somewhat reluctant to come into the hospital for cardiac catheterization. Her husband had just  undergone CABG.  She wanted to tend to him further until he had recovered. Therefore, she was begun on Imdur 15 mg a day, was given sublingual nitroglycerin tablets to use p.r.n.  She was instructed to return to the hospital if her symptoms changed.  She was also begun on an aspirin a day.  On August 31, 2001, she did come into the hospital for her outpatient cardiac catheterization.  The results are noted above.  She tolerated the procedure well and had no new complications.  She would remain on Plavix for four weeks.  Cardiac rehabilitation was asked to see the patient before she left the hospital.  On the morning of September 01, 2001, Dr. Andee Lineman saw the patient.  She was doing well without any chest pain or shortness of breath and vital signs were stable, and her catheterization site was stable. It was felt that she was ready for discharge to home.  LABORATORY DATA:  White blood cell count 5400, hemoglobin 10.9, hematocrit 31.2, platelet count 257,000.  Sodium 138, potassium 3.9, chloride 104, CO2 226, glucose 103, BUN 14, creatinine 0.7.  DISCHARGE MEDICATIONS:  1. Plavix 75 mg q.d. x4 weeks.  2. Altace 2.5 mg q.d.  3. Premarin 0.625 mg q.d.  4. Toprol XL 25 mg q.d.  5. Welchol 625 mg 2 tablets t.i.d.  6. Synthroid 0.5 mg q.d.  7. Ditropan p.r.n.  8. Nexium 40 mg q.d.  9. Coated aspirin 325 mg q.d. 10. Imdur 15 mg q.d. 11. Nitroglycerin 0.4 mg sublingual p.r.n. chest pain.  ACTIVITY: No driving, heavy lifting, exertion, sex, or work for three days.  DIET: Lymph node, low sodium.  WOUND CARE: The patient should call for any groin swelling, bleeding, or bruising.  FOLLOWUP: She is reminded at discharge that she has an appointment with Dr. Shelle Iron on September 24 at 3:15 p.m.  She will follow up with Dr. Andee Lineman on October 2 at 4:30 p.m. Dictated by:   Tereso Newcomer, P.A.-C. Attending:  Lewayne Bunting, M.D. [facility] DD:  09/01/01 TD:  09/01/01 Job: [id number]

## 2011-05-09 NOTE — Discharge Summary (Signed)
NAMEJENIYAH, Joyce [name] NO.:  [id number]   MEDICAL RECORD NO.:  [id number]                   PATIENT TYPE:  INP   LOCATION:  4705                                 FACILITY:  [facility]   PHYSICIAN:  Learta Codding, M.D. [facility]             DATE OF BIRTH:  [birth year]   DATE OF ADMISSION:  01/09/2003  DATE OF DISCHARGE:  01/11/2003                           DISCHARGE SUMMARY - REFERRING   PROCEDURES:  1. Cardiac catheterization.  2. Coronary arteriogram.  3. Left ventriculogram.   HOSPITAL COURSE:  Joyce [name] is a 77 year old female with a history of  coronary artery disease.  She came to the emergency room on 01/09/03 for  chest pain as well as increased fatigue.  Her pain increased with deep  inspiration and she had other pain over the past two weeks but these  symptoms were different and responded to sublingual nitroglycerin.  She was  seen by her family physician and sent to the emergency room for unstable  anginal pain.  She was admitted to rule out MI and for further evaluation.   Her enzymes were negative for MI but with her extensive history of coronary  artery disease a cardiac catheterization was felt indicated and was  performed on 01/10/03.   The cardiac catheterization showed an EF of 70% and a left main without  significant disease.  The LAD had no significant disease but the first  diagonal had a 50% stenosis.  The previous stented circumflex was widely  patent and the OM had a 30% stenosis as well and 30% stenosis was seen in  the RCA.  She failed  Perclose and so was kept overnight.   The next day, she was ambulating without difficulty.  Additionally, because  of the concern for bronchitis, she had been placed on azithromycin as well  as Robitussin DM on a p.r.n. basis.  She was afebrile and her oxygen  saturation was normal.  Her BMP was also checked and it was within normal  limits as well.  She was considered stable for discharge on 01/11/03  with  outpatient followup arranged.   LABORATORY VALUES:  Hemoglobin 12.2, hematocrit 35.3, WBC 5.8, platelets  194,000.  Sodium 137, potassium 3.7, chloride 101, CO2 26, BUN 9, creatinine  0.7, glucose 98, BMP 32.  Serial CK-MB and troponin negative for MI.   Chest x-ray:  Cardiomegaly with prominent marking in the bases but no  definite active findings.   CONDITION ON DISCHARGE:  Stable.   DISCHARGE DIAGNOSES:  1. Chest pain, no critical coronary artery disease by cardiac     catheterization, possibly pleuritic.  2. Bronchitis.  3. History of sleep apnea.  4. Diabetes mellitus.  5. Hypertension.  6. Hyperlipidemia.  7. Family history of coronary artery disease.  8. Remote history of tobacco use.  9. History of allergy to penicillin, Claritin, Vioxx; intolerance to  aspirin, allergy to Levaquin; intolerance to any muscle relaxer and     surgical tape.  10.      Status post percutaneous transluminal coronary angioplasty and     stent to the circumflex in 2002 secondary to myocardial infarction.  11.      Status post cholecystectomy and hysterectomy.  12.      History of spastic colon.  13.      Hypothyroidism.   DISCHARGE INSTRUCTIONS:  Her activity level is to include no driving or  strenuous activity for two days.  She is to stick to a low fat, diabetic  diet.  She is to call the office with problems of the catheterization site.  She is to follow up with Dr. Andrey Campanile as scheduled.  She is to follow up with  Dr. Andee Lineman on 01/24/03 at 4:15 p.m.   DISCHARGE MEDICATIONS:  1. Altace 2.5 mg daily.  2. Detrol LA 4 mg daily.  3. Synthroid 125 mcg daily.  4. Lipitor 10 mg daily.  5. Nexium 40 mg daily.  6. Welchol two tablets t.i.d.  7. Premarin 0.625 mg daily.  8. Vitamins and Colace as prior to admission.  9. Robitussin-DM p.r.n.  10.      Toprol XL 25 mg daily.     Joyce [name], P.A. [facility]                  Learta Codding, M.D. [facility]    RG/MEDQ  D:  01/11/2003  T:   01/11/2003  Job:  [id number]   cc:   [place]. Andrey Campanile, M.D.  [address]  [place]  [place] [address]  Fax: [phone]   Learta Codding, M.D. [facility]

## 2011-05-09 NOTE — Op Note (Signed)
NAMEBETZABETH, Joyce [name] NO.:  [id number]   MEDICAL RECORD NO.:  [id number]                   PATIENT TYPE:  INP   LOCATION:  4705                                 FACILITY:  [facility]   PHYSICIAN:  Salvadore Farber, M.D. [facility]         DATE OF BIRTH:  1931-12-03   DATE OF PROCEDURE:  01/10/2003  DATE OF DISCHARGE:                                 OPERATIVE REPORT   PROCEDURE:  Left heart catheterization, left ventriculography, coronary  angiography.   INDICATIONS:  The patient is a 77 year old lady who is status post stenting  of her circumflex in September 2002, who presents with recurrent chest  discomfort over the past several weeks. This has occurred both at rest and  exertion. She is referred for diagnostic angiography.   DIAGNOSTIC TECHNIQUE:  Informed consent was obtained. Under 1% Lidocaine  local anesthesia, a 6 French sheath was placed in the right femoral artery  using modified Seldinger technique. Diagnostic angiography was performed  using JL-4 and JR-4 catheters. An angled pigtail catheter was used to  perform ventriculography in the RAO projection.   An attempt was made to close the artery with a 6 French Perclose device. The  knot was delivered. However, there was substantial oozing thereafter.  Therefore a fem-stop device was placed with maintenance of hemostasis. The  patient was transferred to the holding room in stable condition.   COMPLICATIONS:  None.   FINDINGS:  1. Left ventricular:  189/12/21, EF 70% without regional wall motion     abnormality.  2. Left main:  Angiographically normal.  3. LAD:  The LAD is a moderate sized vessel giving rise to a small, single     diagonal branch. There is a 50% stenosis of the proximal LAD at the     takeoff of the diagonal branch and a large septal.  4. Circumflex:  The circumflex is a moderate sized vessel giving rise to a     single large obtuse marginal branch. The previously placed stent  in the     proximal vessel was widely patent. There is a 30% stenosis of the     proximal portion of OM1 which was unchanged from prior.  5. RCA:  The RCA was a large, dominant vessel. There is a 30% stenosis of     the mid vessel.    IMPRESSION/RECOMMENDATIONS:  The patient has a widely patent circumflex  stent. There is moderate coronary disease elsewhere. The most severe lesion  is moderate stenosis of the mid LAD, which is unchanged compared with the  films of September 2002. I suspect a noncardiac etiology to her recent chest  discomfort.  Salvadore Farber, M.D. [facility]    WED/MEDQ  D:  01/10/2003  T:  01/10/2003  Job:  [id number]   cc:   Dr. Andrey Campanile  Phone [phone]   Learta Codding, M.D. [facility]

## 2011-05-09 NOTE — Cardiovascular Report (Signed)
[name]. [facility]  Patient:    Joyce [name], Joyce [name] Visit Number: [id number] MRN: [id number]          Service Type: CAT Location: [facility] 2852 01 Attending Physician:  Learta Codding Dictated by:   Everardo Beals Juanda Chance, M.D. [facility] Proc. Date: 08/31/01 Admit Date:  08/31/2001   CC:         Duffy Rhody C. Andrey Campanile, M.D.  Lewayne Bunting, M.D. [facility]  Cardiopulmonary Lab   Cardiac Catheterization  HISTORY:  Joyce [name] is 77 years old and has had no prior known history of heart disease.  She does have sleep apnea.  She has multiple risk factors for coronary disease including hypertension, hyperlipidemia, and a family history for coronary disease.  She recently developed exertional chest tightness and was seen in consultation by Dr. Andee Lineman and then myself, and arrangements were made for her to come into the hospital for evaluation angiography.  PROCEDURE:   Cardiac catheterization.  CARDIOLOGISTEverardo Beals Juanda Chance, M.D. [facility]  PROCEDURE IN DETAIL:  The procedure was performed via the right femoral artery using a arterial sheath and 6-French preformed coronary catheters.  A frontal arterial punch was performed, and Omnipaque contrast was used.  At the completion of diagnostic study, I made decision to perform IVUS on the circumflex lesion to see if it was tight enough to warrant intervention.  The patient was given weight-adjusted heparin that prolonged the ACT to greater than 200 seconds.  We used a CLS 3.5 6-French guiding catheter and a trooper wire.  We crossed the lesion with the wire without much difficulty. After giving intracoronary nitroglycerin, we crossed with an Atlantis catheter and did an automatic pullback.  We then made the decision to proceed with tip pullback.  The smallest lumen dimension was 1.5 x 2.0 with a reference lumen of 3.5 x 3.5.  We felt this was tight enough to treat and decided to proceed with stenting.  We gave additional heparin to target an ACT of  greater than 300 seconds.  We direct stented with a 3.0 x 12 mm NIR stent, deploying this with one inflation of 14 atmospheres for 30 seconds.  We then post dilated with a 3.5 x 12 mm Quantum Ranger, dilating this with one inflation up to 12 atmospheres for 30 seconds.  Repeat diagnostic study was then performed through the guiding catheter.  The patient tolerated the procedure well and left the laboratory in satisfactory condition.  PRESSURES:  The left ventricular pressure was 162/25.  Aortic pressure was 162/66 with a mean of 105.  RESULTS:  Left main coronary artery was free of significant disease.  Left anterior descending artery gave rise to two septal perforator and two diagonal branches.  There was 40% narrowing in the proximal LAD.  The left circumflex artery gave rise to a small marginal branch, a large marginal branch, and an A-V branch was terminated in a small posterolateral branch.  There was 75% narrowing in the proximal vessel right at the takeoff of the first small marginal branch.  There was 90% ostial disease in the marginal branch.  There was 50% narrowing in the proximal portion of the second large marginal branch.  The right coronary artery was a moderate size vessel that gave rise to an atrial branch, right ventricular branch, a posterior descending branch, and two posterolateral branches.  There was 40% proximal and 40% mid stenosis in the right coronary artery.  The left ventriculogram performed in the RAO projection showed good wall  motion with no evidence of hypokinesis.  The estimated ejection fraction was 60%.  Following stenting of a proximal circumflex lesion, the stenosis improved from 75% to less than 0%.  There was no dissection seen.  The ostial stenosis in the small marginal branch got perhaps slightly worse, but the flow remained good.  CONCLUSION: 1. Coronary artery disease with 40% narrowing in the proximal left anterior    descending  artery, 75% narrowing in the proximal circumflex artery with 90%    ostial narrowing in the first small marginal branch and 50% proximal    narrowing in the second large marginal branch, 40% proximal and 40% mid    stenosis in the right coronary artery and normal left ventricular function. 2. Successful stenting of the proximal circumflex stenosis with improvement    in the stent narrowing from 75% to 0%.  DISPOSITION:  The patient was returned to recovery room for further observation. Dictated by:   Everardo Beals Juanda Chance, M.D. [facility] Attending Physician:  Learta Codding DD:  08/31/01 TD:  08/31/01 Job: [id number]

## 2011-05-09 NOTE — Assessment & Plan Note (Signed)
[facility] HEALTHCARE                            CARDIOLOGY OFFICE NOTE   NAME:Joyce [name], Joyce [name]                       MRN:          [id number]  DATE:11/30/2006                            DOB:          09-19-1931    PRIMARY CARE PHYSICIAN:  Quita Skye. Artis Flock, M.D.   HISTORY OF PRESENT ILLNESS:  Joyce [name] is a 77 year old lady with  atherosclerotic coronary artery disease for which she was treated with  bare metal stenting of the circumflex by Dr. Juanda Chance in 2004. She has  done very nicely since then. She has had no recurrent chest discomfort.  She does have some mild exertional dyspnea, which is chronic, and she  attributes to her obesity.     HER MANAGEMENT HAS BEEN COMPLICATED BY INTOLERANCE TO MULTIPLE STATINS  AS WELL AS TO ZETIA. She has been tolerating Welchol. Her cholesterol is  managed by Dr. Bradd Canary.   CURRENT MEDICATIONS:  1. Synthroid 125 mcg per day.  2. Triamterene/hydrochlorothiazide 37.5/25 one per day.  3. Toprol XL 25 mg per day.  4. Detrol 4 mg per day.  5. Premarin 0.625 mg per day.  6. Welchol 1250 mg three times per day.  7. Multiple vitamins.  8. Protonix 40 mg twice per day.  9. Aspirin 81 mg per day.   PHYSICAL EXAMINATION:  She is generally well-appearing in no distress  with heart rate 66, blood pressure 144/68 and weight 166 pounds.  She has no jugular venous distention. No thyromegaly.  LUNGS:  Are clear to auscultation.  She has a nondisplaced point of maximal cardiac impulse. There is a  regular rate and rhythm without murmur, rub, gallop.  ABDOMEN: Soft, nondistended, nontender. It is obese. Bowel sounds  normal. No palpable midline mass. No hepatosplenomegaly. Examination is  somewhat limited by habitus, however.  EXTREMITIES: Are warm without clubbing, cyanosis, edema or ulceration.  Carotid pulses are 2+ bilaterally without bruit.  She is alert and oriented x3 with normal affect.   LABORATORY DATA:  Laboratory  studies provided by Dr. Artis Flock and performed  on November 18, 2006, remarkable for creatinine 0.7, potassium 4.7,  sodium 141; hematocrit 44.6; LDL 143, HDL 52; TSH 1.7.   ELECTROCARDIOGRAM:  Normal sinus rhythm with possible septal infarct and  left axis deviation. Septal Q-waves are narrow, but possibly new  compared with August 22, 2005. However, electrocardiogram fairly  similar to one from Apr 30, 2004.   IMPRESSION/RECOMMENDATIONS:  1. Coronary disease: Asymptomatic. Continue current management      including aspirin and beta-blocker.  2. Hypercholesterolemia: Managed by Dr. Artis Flock. Difficult due to her      intolerance to multiple statins and Zetia. Emphasized the      importance of compliance with diet and exercise.   I will plan on seeing her back in 18 months' time.     Salvadore Farber, MD  Electronically Signed    WED/MedQ  DD: 11/30/2006  DT: 11/30/2006  Job #: [id number]   cc:   Quita Skye. Artis Flock, M.D.

## 2011-05-09 NOTE — Discharge Summary (Signed)
   Joyce [name], ARANAS NO.:  [id number]   MEDICAL RECORD NO.:  [id number]                   PATIENT TYPE:  INP   LOCATION:  4705                                 FACILITY:  [facility]   PHYSICIAN:  Learta Codding, M.D. [facility]             DATE OF BIRTH:  [birth year]   DATE OF ADMISSION:  01/09/2003  DATE OF DISCHARGE:  01/11/2003                           DISCHARGE SUMMARY - REFERRING   ADDENDUM:  In discussion with the patient, it was discovered that she had no  known history of diabetes.  It is unclear how the diagnosis came to be in  her H&P but her admission CBG was 247.  At that time of discharge, she had a  repeat CBG performed which was nonfasting and just before lunch.  The CBG  was significantly elevated at 247.  The patient was given some basic  concepts for avoiding processed sugars and limiting carbohydrates.  She  stated she would follow up with her family physician hopefully within the  next 48 hours, but definitely within one week.  The diagnosis will be left  on the patient's problem list and further evaluation and management of this  will be left to Dr. Duffy Rhody C. Wilson.      Lavella Hammock, P.A. [facility]                  Learta Codding, M.D. [facility]    RG/MEDQ  D:  01/11/2003  T:  01/12/2003  Job:  [id number]

## 2011-05-09 NOTE — Consult Note (Signed)
NAME:  Joyce [name], Joyce [name] NO.:  [id number]   MEDICAL RECORD NO.:  [id number]                    PATIENT TYPE:   LOCATION:                                       FACILITY:   PHYSICIAN:  Learta Codding, M.D. [facility]             DATE OF BIRTH:  [birth year]   DATE OF CONSULTATION:  DATE OF DISCHARGE:                                   CONSULTATION   HISTORY OF PRESENT ILLNESS:  The patient is a 77 year old white female who  is being followed by me in the clinic.  The patient has known coronary  artery disease, status post stent into the left circumflex coronary artery  by Dr. Juanda Chance on August 31, 2001.  The patient, over the last year, had  been doing well.  She reported no substernal chest pain.  She does have mild  shortness of breath on exertion.  She has a diagnosis of obstructive sleep  apnea and possibly diastolic dysfunction.  She also has a longstanding  history of hypertension and hyperlipidemia.  In addition, she has a strong  family history for heart disease.  The patient has now been referred from  Dr. Tawana Scale office after she reports that the last two weeks, increased  symptoms of substernal chest pain both at rest and on exertion. The patient  did not call my office to notify me but has been ____ on several occasions  with improvement of her symptoms.  Last night when watching the ball game,  she also became more short of breath and reports that at times substernal  chest pain.  This was associated with post nasal drip and some hoarseness as  well as throat tightness.  She was seen by Dr. Andrey Campanile today and preceded to  give the above history.  She is now seen in the emergency room and reports  mild substernal chest pressure but there are no EKG changes.  The patient  does complain of hoarseness and throat tightness.  She has no palpitations.  She is currently hemodynamically stable.   PAST MEDICAL HISTORY:  1. Coronary artery disease status post  stent placement to circumflex     coronary artery, cardiac catheterization by Dr. Juanda Chance in September 2002,     revealing 40% proximal LAD stenosis, 75% proximal circumflex stenosis,     OM vessel with 90% stenosis, OM2 with 50% stenosis, RCA with proximal     stenosis.  Normal left ventricular ejection fraction of 60%.  2. History of sleep apnea.  3. History of diabetes mellitus.  4. History of hypertension.  5. History of hyperlipoproteinemia.   FAMILY HISTORY:  This is significant for coronary artery disease.   HABITS:  The patient has prior history of tobacco use.  She quit 14 years  ago.   MEDICATIONS:  These are pending.  Please see note of Guy Franco, P.A.   REVIEW OF SYSTEMS:  As per HPI.  No nausea or vomiting, fever or chills,  orthopnea, PND, no palpitations or syncope.   PHYSICAL EXAMINATION:  GENERAL APPEARANCE:  A well-nourished white female in  no apparent distress.  VITAL SIGNS:  Blood pressure 130/70, heart rate 75 beats per minute, weight  is stable.  HEENT:  Pupils are reactive to light.  Conjunctivae clear.  NECK:  Supple.  LUNGS:  Clear breath sounds bilaterally.  CARDIOVASCULAR:  Regular rate and rhythm with normal S1 and S2, no murmurs,  rubs, clicks or gallops.  ABDOMEN:  Soft and nontender with no rebound or guarding.  Good bowel  sounds.  EXTREMITIES:  There are 2+ peripheral pulses.  No clubbing, cyanosis, or  edema.   LABORATORY DATA:  The labs are pending.   IMPRESSION:  1. Rule out unstable angina.  The patient's symptoms are somewhat worse for     worsening ischemic heart disease.  She has received extensive circumflex     in the past.  However, the patient is quite anxious and very tearful in     the emergency room today.  She is celebrating her 72th birthday on     Wednesday and has overnight stay at the granddaughter's planned.     However, given her multiple risk factors and prior coronary artery     disease, I do think the best way to  evaluate this patient is to proceed     with cardiac catheterization.  I have discussed risks and benefits with     her and she is willing to proceed.  2. Obstructive sleep apnea.  Will continue her CPAP mask.  The patient had     not been using this for the last 10 days.  3. ____ lipidemia. Continue on current medical therapy.  4. Rule out bronchitis.  The patient was placed on CPAP.   DISPOSITION:  Cardiac catheterization in the morning.                                               Learta Codding, M.D. [facility]    GED/MEDQ  D:  01/09/2003  T:  01/09/2003  Job:  [id number]

## 2011-06-09 ENCOUNTER — Ambulatory Visit (INDEPENDENT_AMBULATORY_CARE_PROVIDER_SITE_OTHER): Payer: Medicare Other | Admitting: Internal Medicine

## 2011-06-09 ENCOUNTER — Encounter: Payer: Self-pay | Admitting: Internal Medicine

## 2011-06-09 VITALS — BP 140/80 | HR 56 | Temp 98.2°F | Resp 16 | Ht 59.0 in | Wt 154.0 lb

## 2011-06-09 DIAGNOSIS — K219 Gastro-esophageal reflux disease without esophagitis: Secondary | ICD-10-CM

## 2011-06-09 DIAGNOSIS — Z23 Encounter for immunization: Secondary | ICD-10-CM

## 2011-06-09 DIAGNOSIS — Z0000 Encounter for general adult medical examination without abnormal findings: Secondary | ICD-10-CM

## 2011-06-09 DIAGNOSIS — E039 Hypothyroidism, unspecified: Secondary | ICD-10-CM

## 2011-06-09 DIAGNOSIS — M545 Low back pain: Secondary | ICD-10-CM

## 2011-06-09 DIAGNOSIS — Z2911 Encounter for prophylactic immunotherapy for respiratory syncytial virus (RSV): Secondary | ICD-10-CM

## 2011-06-09 DIAGNOSIS — I10 Essential (primary) hypertension: Secondary | ICD-10-CM

## 2011-06-09 MED ORDER — ESOMEPRAZOLE MAGNESIUM 40 MG PO CPDR: 40.0000 mg | DELAYED_RELEASE_CAPSULE | Freq: Every day | ORAL | Status: DC

## 2011-06-09 MED ORDER — ATENOLOL-CHLORTHALIDONE 50-25 MG PO TABS: 1.0000 | ORAL_TABLET | Freq: Every day | ORAL | Status: DC

## 2011-06-09 NOTE — Progress Notes (Signed)
  Subjective:    Patient ID: Joyce [name], female    DOB: 05/11/1931, 77 y.o.   MRN: [id number]  HPI  Increased pain in left foot, back pain increased. Under home physical therapy and has been doing back exercises still has radicular pain down the left leg and noticed swelling in her left foot.  Reflux has been stable blood pressure is moderately elevated today Pulse is slow at approximately 56 and repeat blood pressure was 140/80  Review of Systems Review of systems she is a pleasant white female in apparent emotional stress of dealing with her husband with dementia.  Other than her arthritic complaints review of systems is negative for all systems.      Objective:   Physical Exam Blood pressure 140/80, pulse 56, temperature 98.2 F (36.8 C), resp. rate 16, height 4\' 11"  (1.499 m), weight 154 lb (69.854 kg). Patient is a pleasant elderly white female blood pressure was initially  150/80 after rest her blood pressure was 140/80 her pulse was 58 and regular.pupils are equal round reactive to light and accommodation neck was supple lung fields are clear to auscultation percussion heart examination showed regular rate and rhythm with 1/6 systolic murmur abdomen was soft nontender extremity examination revealed no edema.        Assessment & Plan:  Blood pressure is stable.  Gastroesophageal reflux stable on Nexium samples of Nexium given.  Hypothyroidism is stable at this point primary complaint today is arthritic pain she is seeing the orthopedist she is has had in-house physical therapy we will give her a series of back exercises to assist with controlling her back pain.

## 2011-06-09 NOTE — Progress Notes (Signed)
Addended by: Willy Eddy on: 06/09/2011 06:28 PM   Modules accepted: Orders

## 2011-06-09 NOTE — Patient Instructions (Signed)
Back Exercises Back exercises help treat and prevent back injuries. The goal of back exercises is to increase the strength of your abdominal and back muscles and the flexibility of your back. These exercises should be started when you no longer have back pain. Back exercises include: 1. Pelvic Tilt - Lie on your back with your knees bent. Tilt your pelvis until the lower part of your back is against the floor. Hold this position 5-10 sec and repeat 5-10 times.  2. Knee to Chest - Pull first one knee up against your chest and hold for 20-30 seconds, repeat this with the other knee, and then both knees. This may be done with the other leg straight or bent, whichever feels better.  3. Sit-Ups or Curl-Ups - Bend your knees 90 degrees. Start with tilting your pelvis, and do a partial, slow sit-up, lifting your trunk only 30-45 degrees off the floor. Take at least 2-3 sec for each sit-up. Do not do sit-ups with your knees out straight. If partial sit-ups are difficult, simply do the above but with only tightening your abdominal muscles and holding it as directed.  4. Hip-Lift - Lie on your back with your knees flexed 90 degrees. Push down with your feet and shoulders as you raise your hips a couple inches off the floor; hold for 10 sec, repeat 5-10 times.  5. Back arches - Lie on your stomach, propping yourself up on bent elbows. Slowly press on your hands, causing an arch in your low back. Repeat 3-5 times. Any initial stiffness and discomfort should lessen with repetition over time.  6. Shoulder-Lifts - Lie face down with arms beside your body. Keep hips and torso pressed to floor as you slowly lift your head and shoulders off the floor.  Do not overdo your exercises, especially in the beginning. Exercises may cause you some mild back discomfort which lasts for a few minutes; however, if the pain is more severe, or lasts for more than 15 minutes, do not continue exercises until you see your caregiver.  Improvement with exercise therapy for back problems is slow.  See your caregivers for assistance with developing a proper back exercise program. Document Released: 01/15/2005 Document Re-Released: 03/06/2009 [employer] Patient Information 2011 [employer]. 

## 2011-06-19 ENCOUNTER — Ambulatory Visit (INDEPENDENT_AMBULATORY_CARE_PROVIDER_SITE_OTHER): Payer: Medicare Other | Admitting: Family Medicine

## 2011-06-19 ENCOUNTER — Encounter: Payer: Self-pay | Admitting: Family Medicine

## 2011-06-19 VITALS — BP 120/70 | HR 56 | Temp 97.9°F | Resp 12 | Wt 152.0 lb

## 2011-06-19 DIAGNOSIS — F419 Anxiety disorder, unspecified: Secondary | ICD-10-CM

## 2011-06-19 DIAGNOSIS — R531 Weakness: Secondary | ICD-10-CM

## 2011-06-19 DIAGNOSIS — Z79899 Other long term (current) drug therapy: Secondary | ICD-10-CM

## 2011-06-19 DIAGNOSIS — R5381 Other malaise: Secondary | ICD-10-CM

## 2011-06-19 DIAGNOSIS — F411 Generalized anxiety disorder: Secondary | ICD-10-CM

## 2011-06-19 LAB — CBC WITH DIFFERENTIAL/PLATELET
Basophils Absolute: 0 10*3/uL (ref 0.0–0.1)
Eosinophils Relative: 1.7 % (ref 0.0–5.0)
Hemoglobin: 14.5 g/dL (ref 12.0–15.0)
Lymphocytes Relative: 36.3 % (ref 12.0–46.0)
Monocytes Relative: 6.8 % (ref 3.0–12.0)
Neutro Abs: 2.7 10*3/uL (ref 1.4–7.7)
RBC: 4.59 Mil/uL (ref 3.87–5.11)
RDW: 13.7 % (ref 11.5–14.6)
WBC: 5 10*3/uL (ref 4.5–10.5)

## 2011-06-19 LAB — LIPID PANEL
Cholesterol: 229 mg/dL — ABNORMAL HIGH (ref 0–200)
HDL: 56.7 mg/dL (ref >39.00)
Total CHOL/HDL Ratio: 4
VLDL: 21.6 mg/dL (ref 0.0–40.0)

## 2011-06-19 LAB — POCT URINALYSIS DIPSTICK
Bilirubin, UA: NEGATIVE
Glucose, UA: NEGATIVE
Ketones, UA: NEGATIVE
Protein, UA: NEGATIVE

## 2011-06-19 LAB — BASIC METABOLIC PANEL
BUN: 15 mg/dL (ref 6–23)
Calcium: 9.2 mg/dL (ref 8.4–10.5)
Creatinine, Ser: 0.5 mg/dL (ref 0.4–1.2)
GFR: 126.68 mL/min (ref >60.00)

## 2011-06-19 LAB — TSH: TSH: 3.7 u[IU]/mL (ref 0.35–5.50)

## 2011-06-19 LAB — HEPATIC FUNCTION PANEL
Bilirubin, Direct: 0.2 mg/dL (ref 0.0–0.3)
Total Bilirubin: 1.6 mg/dL — ABNORMAL HIGH (ref 0.3–1.2)

## 2011-06-19 MED ORDER — LORAZEPAM 0.5 MG PO TABS: 0.5000 mg | ORAL_TABLET | Freq: Three times a day (TID) | ORAL | Status: DC | PRN

## 2011-06-19 NOTE — Progress Notes (Signed)
  Subjective:    Patient ID: Joyce [name], female    DOB: 27-Apr-1931, 76 y.o.   MRN: [id number]  HPI Here for several reasons. First she has flet very fatigued and weak for several weeks. She is fasting and asks if we can draw some lab tests today. I see from her chart it has been a year and a half since she had any labs drawn. Her BP is stable. She has been working with Dr. Lovell Sheehan on some low back pain. She also describes being under a lot of stress, since she takes care of her husband who has Alzheimers dementia. He has gotten worse rapidly over the past week, and his personality has changed a lot. He is demanding and can be rude, and this is hard on her. She does get a lot of help from her daughter.    Review of Systems  Constitutional: Positive for fatigue.  Respiratory: Negative.   Cardiovascular: Negative.   Gastrointestinal: Negative.   Genitourinary: Negative.   Psychiatric/Behavioral: The patient is nervous/anxious.        Objective:   Physical Exam  Constitutional: She appears well-developed and well-nourished.  Cardiovascular: Normal rate, regular rhythm, normal heart sounds and intact distal pulses.   Pulmonary/Chest: Effort normal and breath sounds normal.  Psychiatric:       Tearful, alert           Assessment & Plan:  We will get labs to check her thyroid level, etc. Try some Lorazepam on an as needed basis. I suggested she see the priest at her church for some counseling.

## 2011-08-26 ENCOUNTER — Other Ambulatory Visit: Payer: Self-pay | Admitting: Internal Medicine

## 2011-09-15 ENCOUNTER — Encounter: Payer: Self-pay | Admitting: Internal Medicine

## 2011-09-15 ENCOUNTER — Ambulatory Visit (INDEPENDENT_AMBULATORY_CARE_PROVIDER_SITE_OTHER): Payer: Medicare Other | Admitting: Internal Medicine

## 2011-09-15 VITALS — BP 156/74 | HR 76 | Temp 98.2°F | Resp 16 | Ht 60.0 in | Wt 151.0 lb

## 2011-09-15 DIAGNOSIS — Z23 Encounter for immunization: Secondary | ICD-10-CM

## 2011-09-15 DIAGNOSIS — F4322 Adjustment disorder with anxiety: Secondary | ICD-10-CM

## 2011-09-15 DIAGNOSIS — E039 Hypothyroidism, unspecified: Secondary | ICD-10-CM

## 2011-09-15 DIAGNOSIS — K589 Irritable bowel syndrome without diarrhea: Secondary | ICD-10-CM

## 2011-09-15 DIAGNOSIS — E785 Hyperlipidemia, unspecified: Secondary | ICD-10-CM

## 2011-09-15 DIAGNOSIS — I10 Essential (primary) hypertension: Secondary | ICD-10-CM

## 2011-09-15 LAB — CBC WITH DIFFERENTIAL/PLATELET
Basophils Relative: 0.5 % (ref 0.0–3.0)
Eosinophils Relative: 1.1 % (ref 0.0–5.0)
Hemoglobin: 14.1 g/dL (ref 12.0–15.0)
Lymphocytes Relative: 29.9 % (ref 12.0–46.0)
MCV: 91.5 fl (ref 78.0–100.0)
Monocytes Absolute: 0.4 10*3/uL (ref 0.1–1.0)
Neutrophils Relative %: 62.2 % (ref 43.0–77.0)
RBC: 4.54 Mil/uL (ref 3.87–5.11)
WBC: 6.2 10*3/uL (ref 4.5–10.5)

## 2011-09-15 LAB — BASIC METABOLIC PANEL
BUN: 16
BUN: 30 — ABNORMAL HIGH
BUN: 5 — ABNORMAL LOW
BUN: 7
BUN: 9 mg/dL (ref 6–23)
CO2: 19
CO2: 20
CO2: 21
CO2: 23
Chloride: 101
Chloride: 108
Chloride: 115 — ABNORMAL HIGH
Chloride: 115 — ABNORMAL HIGH
Chloride: 99 mEq/L (ref 96–112)
Creatinine, Ser: 0.6
Creatinine, Ser: 0.64
Creatinine, Ser: 0.79
Creatinine, Ser: 1.08
Creatinine, Ser: 1.46 — ABNORMAL HIGH
GFR calc Af Amer: 42 — ABNORMAL LOW
GFR calc non Af Amer: 49 — ABNORMAL LOW
Glucose, Bld: 108 — ABNORMAL HIGH
Glucose, Bld: 131 — ABNORMAL HIGH
Glucose, Bld: 91
Glucose, Bld: 91 mg/dL (ref 70–99)
Potassium: 3.7 mEq/L (ref 3.5–5.1)
Sodium: 132 — ABNORMAL LOW

## 2011-09-15 LAB — COMPREHENSIVE METABOLIC PANEL
ALT: 63 — ABNORMAL HIGH
AST: 46 — ABNORMAL HIGH
Alkaline Phosphatase: 46
CO2: 19
Calcium: 9
Chloride: 104
GFR calc Af Amer: >60
GFR calc non Af Amer: 53 — ABNORMAL LOW
Glucose, Bld: 134 — ABNORMAL HIGH
Potassium: 2.9 — ABNORMAL LOW
Sodium: 136

## 2011-09-15 LAB — DIFFERENTIAL
Basophils Absolute: 0
Basophils Relative: 0
Basophils Relative: 0
Eosinophils Absolute: 0
Eosinophils Absolute: 0
Eosinophils Relative: 0
Eosinophils Relative: 0
Lymphocytes Relative: 18
Lymphs Abs: 1.1
Lymphs Abs: 1.7
Monocytes Absolute: 0.6
Monocytes Relative: 6
Neutro Abs: 7.2
Neutrophils Relative %: 75

## 2011-09-15 LAB — MAGNESIUM
Magnesium: 1.8
Magnesium: 2.3

## 2011-09-15 LAB — URINALYSIS, ROUTINE W REFLEX MICROSCOPIC
Bilirubin Urine: NEGATIVE
Glucose, UA: NEGATIVE
Ketones, ur: NEGATIVE
Nitrite: NEGATIVE
Protein, ur: 100 — AB
Protein, ur: NEGATIVE
pH: 6

## 2011-09-15 LAB — HEPATIC FUNCTION PANEL
ALT: 46 — ABNORMAL HIGH
ALT: 58 — ABNORMAL HIGH
Alkaline Phosphatase: 46
Bilirubin, Direct: 0.2
Indirect Bilirubin: 1.5 — ABNORMAL HIGH
Indirect Bilirubin: 1.7 — ABNORMAL HIGH
Total Bilirubin: 1.9 — ABNORMAL HIGH
Total Protein: 6.4

## 2011-09-15 LAB — URINE MICROSCOPIC-ADD ON

## 2011-09-15 LAB — CBC
HCT: 49.5 — ABNORMAL HIGH
Hemoglobin: 17.1 — ABNORMAL HIGH
Hemoglobin: 17.1 — ABNORMAL HIGH
MCHC: 34.5
MCHC: 35
MCHC: 35.5
MCV: 85.8
MCV: 85.8
MCV: 86.3
Platelets: 186
Platelets: 260
Platelets: 276
RBC: 5.64 — ABNORMAL HIGH
RBC: 5.74 — ABNORMAL HIGH
RDW: 12.8
WBC: 10.4
WBC: 7.3
WBC: 9.6

## 2011-09-15 LAB — BASIC METABOLIC PANEL WITH GFR
BUN: 38 — ABNORMAL HIGH
Calcium: 8.6
GFR calc non Af Amer: 35 — ABNORMAL LOW
Potassium: 2.7 — CL

## 2011-09-15 LAB — LIPID PANEL
LDL Cholesterol: 110 — ABNORMAL HIGH
Triglycerides: 127
VLDL: 25

## 2011-09-15 LAB — STOOL CULTURE

## 2011-09-15 LAB — TROPONIN I: Troponin I: 0.06

## 2011-09-15 NOTE — Patient Instructions (Signed)
Increase the Metamucil and stop using the Colace if you find that you're constipated and cannot have about the use of Senokot only The Colace could be an irritant that is worsening the irritable bowel syndrome

## 2011-09-15 NOTE — Progress Notes (Signed)
  Subjective:    Patient ID: Joyce [name], female    DOB: January 09, 1931, 77 y.o.   MRN: [id number]  HPI Increased anxiety, episodic diarrhea every 2-3 days, she cannot relate to diet or medications and her normal pattern is constipation Her diet has not changed. Her blood pressures elevated. She has a formed stool prior to the diarrhea ( loose stools) no cramps or pain Back pain is better Leg pain is worse ( radicular) has no been to the chiropractor  Hx of IBS Takes colace   Review of Systems  Constitutional: Negative for activity change, appetite change and fatigue.  HENT: Negative for ear pain, congestion, neck pain, postnasal drip and sinus pressure.   Eyes: Negative for redness and visual disturbance.  Respiratory: Negative for cough, shortness of breath and wheezing.   Gastrointestinal: Negative for abdominal pain and abdominal distention.  Genitourinary: Negative for dysuria, frequency and menstrual problem.  Musculoskeletal: Negative for myalgias, joint swelling and arthralgias.  Skin: Negative for rash and wound.  Neurological: Negative for dizziness, weakness and headaches.  Hematological: Negative for adenopathy. Does not bruise/bleed easily.  Psychiatric/Behavioral: Negative for sleep disturbance and decreased concentration.       Objective:   Physical Exam  Nursing note and vitals reviewed. Constitutional: She is oriented to person, place, and time. She appears well-developed and well-nourished. No distress.  HENT:  Head: Normocephalic and atraumatic.  Right Ear: External ear normal.  Left Ear: External ear normal.  Nose: Nose normal.  Mouth/Throat: Oropharynx is clear and moist.  Eyes: Conjunctivae and EOM are normal. Pupils are equal, round, and reactive to light.  Neck: Normal range of motion. Neck supple. No JVD present. No tracheal deviation present. No thyromegaly present.  Cardiovascular: Normal rate, regular rhythm, normal heart sounds and intact distal  pulses.   No murmur heard. Pulmonary/Chest: Effort normal and breath sounds normal. She has no wheezes. She exhibits no tenderness.  Abdominal: Soft. Bowel sounds are normal.  Musculoskeletal: Normal range of motion. She exhibits no edema and no tenderness.  Lymphadenopathy:    She has no cervical adenopathy.  Neurological: She is alert and oriented to person, place, and time. She has normal reflexes. No cranial nerve deficit.  Skin: Skin is warm and dry. She is not diaphoretic.  Psychiatric: She has a normal mood and affect. Her behavior is normal.          Assessment & Plan:  Functional diarrhea versus irritable bowel syndrome the patient is on Colace and we will discontinue that we will use only Senokot if constipation ensues however at this time we will increase the Metamucil to twice daily we'll measure a CBC and a basic metabolic panel today.  For her back and leg we have recommended several chiropractors and think that is a good option blood pressure is stable medications are currently stable.

## 2011-09-22 ENCOUNTER — Other Ambulatory Visit: Payer: Self-pay | Admitting: Internal Medicine

## 2011-11-03 ENCOUNTER — Other Ambulatory Visit: Payer: Self-pay | Admitting: Internal Medicine

## 2011-11-03 DIAGNOSIS — Z1231 Encounter for screening mammogram for malignant neoplasm of breast: Secondary | ICD-10-CM

## 2011-12-19 ENCOUNTER — Ambulatory Visit
Admission: RE | Admit: 2011-12-19 | Discharge: 2011-12-19 | Disposition: A | Discharge status: Home / Self Care | Payer: Medicare Other | Source: Ambulatory Visit | Attending: Internal Medicine | Admitting: Internal Medicine

## 2011-12-19 DIAGNOSIS — Z1231 Encounter for screening mammogram for malignant neoplasm of breast: Secondary | ICD-10-CM

## 2012-01-16 ENCOUNTER — Ambulatory Visit (INDEPENDENT_AMBULATORY_CARE_PROVIDER_SITE_OTHER): Payer: Medicare Other | Admitting: Internal Medicine

## 2012-01-16 VITALS — BP 146/84 | HR 76 | Temp 98.2°F | Resp 16 | Ht 59.0 in | Wt 143.0 lb

## 2012-01-16 DIAGNOSIS — I10 Essential (primary) hypertension: Secondary | ICD-10-CM

## 2012-01-16 DIAGNOSIS — M549 Dorsalgia, unspecified: Secondary | ICD-10-CM

## 2012-01-16 DIAGNOSIS — F489 Nonpsychotic mental disorder, unspecified: Secondary | ICD-10-CM

## 2012-01-16 DIAGNOSIS — F4321 Adjustment disorder with depressed mood: Secondary | ICD-10-CM

## 2012-01-16 DIAGNOSIS — E039 Hypothyroidism, unspecified: Secondary | ICD-10-CM

## 2012-01-16 MED ORDER — LORAZEPAM 0.5 MG PO TABS: 0.5000 mg | ORAL_TABLET | Freq: Three times a day (TID) | ORAL | Status: DC | PRN

## 2012-01-16 NOTE — Patient Instructions (Signed)
Hears  a little suggestion for you with people tell you that they know you are going through.  First thank them for their concern Second let him know in a very nice way that no one can fully understand what you are going through without having lived your life You appreciate their concern for you but they have to understand that this is your journey to take. You will process it in a very unique way based upon your love for your husband and your experiences in your life. Each journey is unique special and completely individual.

## 2012-02-20 ENCOUNTER — Ambulatory Visit (INDEPENDENT_AMBULATORY_CARE_PROVIDER_SITE_OTHER): Payer: Medicare Other | Admitting: Cardiovascular Disease

## 2012-02-20 ENCOUNTER — Encounter: Payer: Self-pay | Admitting: Cardiovascular Disease

## 2012-02-20 VITALS — BP 138/64 | HR 52 | Ht 59.0 in | Wt 140.8 lb

## 2012-02-20 DIAGNOSIS — E785 Hyperlipidemia, unspecified: Secondary | ICD-10-CM

## 2012-02-20 DIAGNOSIS — I10 Essential (primary) hypertension: Secondary | ICD-10-CM

## 2012-02-20 DIAGNOSIS — I2510 Atherosclerotic heart disease of native coronary artery without angina pectoris: Secondary | ICD-10-CM

## 2012-02-20 MED ORDER — NITROGLYCERIN 0.4 MG SL SUBL: 0.4000 mg | SUBLINGUAL_TABLET | SUBLINGUAL | Status: DC | PRN

## 2012-02-20 NOTE — Progress Notes (Signed)
HPI:  78 year-old woman presenting for follow-up of CAD. She underwent PCI of the left circumflex in 2002 and relook cath in 2004 demonstrating stent patency. She has had a very stressful time of late with her husband. He has worsening Alzheimer's dementia and is now in a SNF. She spends hours with him every day. We spent most of our time discussing this today - has has been a patient of mine as well. She has had episodic chest discomfort with emotional stress, but otherwise has been symptoms-free. This has been very hard on her. She is physically limited because of back and leg pain related to arthritis. No exertional chest pain or pressure. No dyspnea or other complaints.  Outpatient Encounter Prescriptions as of 02/20/2012  Medication Sig Dispense Refill  . atenolol-chlorthalidone (TENORETIC) 50-25 MG per tablet Take 1 tablet by mouth daily.  90 tablet  3  . Biotin (APPEAREX) 2.5 MG TABS Take 2.5 mg by mouth daily.      Marland Kitchen esomeprazole (NEXIUM) 40 MG capsule Take 1 capsule (40 mg total) by mouth daily.  90 capsule  3  . estrogens, conjugated, (PREMARIN) 0.625 MG tablet Take 1 tablet (0.625 mg total) by mouth daily. Take daily for 21 days then do not take for 7 days.  30 tablet  6  . fish oil-omega-3 fatty acids 1000 MG capsule Take 2 g by mouth 3 (three) times daily.        . Lactobacillus (ACIDOPHILUS) 10 MG CAPS Take by mouth 2 (two) times daily.       Marland Kitchen LORazepam (ATIVAN) 0.5 MG tablet Take 1 tablet (0.5 mg total) by mouth every 8 (eight) hours as needed for anxiety.  60 tablet  3  . nitroGLYCERIN (NITROSTAT) 0.4 MG SL tablet Place 1 tablet (0.4 mg total) under the tongue every 5 (five) minutes as needed for chest pain.  30 tablet  6  . oxybutynin (DITROPAN) 5 MG tablet Take 5 mg by mouth 2 (two) times daily.       Marland Kitchen pyridOXINE (VITAMIN B-6) 100 MG tablet Take 100 mg by mouth daily.        Marland Kitchen SYNTHROID 125 MCG tablet TAKE ONE TABLET BY MOUTH EVERY DAY  90 each  3  . vitamin B-12 (CYANOCOBALAMIN)  500 MCG tablet Take 500 mcg by mouth daily.        . vitamin E 400 UNIT capsule Take 400 Units by mouth daily.        . WELCHOL 3.75 G PACK MIX ONE PACKET IN JUICE OR WATER AND DRINK ONE PER DAY  30 each  6    Allergies  Allergen Reactions  . Aspirin   . Bimatoprost   . Levofloxacin   . Loratadine   . Penicillins   . Rofecoxib   . Statins     Past Medical History  Diagnosis Date  . Coronary atherosclerosis of unspecified type of vessel, native or graft   . Unspecified essential hypertension   . Hyperlipidemia   . GERD (gastroesophageal reflux disease)   . Thyroid disease   . Urinary tract infection, site not specified   . Disorder of bone and cartilage, unspecified   . Unspecified adverse effect of unspecified drug, medicinal and biological substance   . Adjustment disorder with anxiety   . Candidiasis of skin and nails   . Family history of malignant neoplasm of genital organ, other   . Personal history of other diseases of digestive system   . Internal  hemorrhoids without mention of complication     ROS: Negative except as per HPI  BP 138/64  Pulse 52  Ht 4\' 11"  (1.499 m)  Wt 63.866 kg (140 lb 12.8 oz)  BMI 28.44 kg/m2  PHYSICAL EXAM: Pt is alert and oriented, NAD HEENT: normal Neck: JVP - normal, carotids 2+= without bruits Lungs: CTA bilaterally CV: RRR without murmur or gallop Abd: soft, NT, Positive BS, no hepatomegaly Ext: no C/C/E, distal pulses intact and equal Skin: warm/dry no rash  EKG:  Sinus brady 52 bpm, LAD, pulm dz pattern, incomplete RBBB, ST-T abnormality consider inferolateral ischemia  ASSESSMENT AND PLAN:

## 2012-02-20 NOTE — Patient Instructions (Signed)
Your physician wants you to follow-up in: 1 YEAR.  You will receive a reminder letter in the mail two months in advance. If you don't receive a letter, please call our office to schedule the follow-up appointment.  Your physician recommends that you continue on your current medications as directed. Please refer to the Current Medication list given to you today.  

## 2012-03-02 ENCOUNTER — Encounter: Payer: Self-pay | Admitting: Cardiovascular Disease

## 2012-03-02 NOTE — Assessment & Plan Note (Signed)
Multiple med intolerance (aspirin and statins) limit medical therapy. Despite that, her CAD has been quiescent for many years. Recommend follow-up in 12 months.

## 2012-03-02 NOTE — Assessment & Plan Note (Signed)
BP is reasonably controlled on atenolol-chlorthalidone. Continue the same.

## 2012-03-02 NOTE — Assessment & Plan Note (Signed)
Cholesterol values above goal. She is statin-intolerant and is on welchol.

## 2012-03-10 ENCOUNTER — Ambulatory Visit: Payer: Medicare Other | Admitting: Internal Medicine

## 2012-03-17 ENCOUNTER — Encounter: Payer: Self-pay | Admitting: Internal Medicine

## 2012-03-17 NOTE — Progress Notes (Signed)
Subjective:    Patient ID: Joyce [name], female    DOB: 08-13-1931, 78 y.o.   MRN: [id number]  HPI Patient is a 78 year old female with multiple medical problems including hypothyroidism gastroesophageal reflux and hyperlipidemia and depression anxiety dealing with a husband who has end-stage COPD and progressive dementia. Her primary complaints today was all around her back pain she has increased back pain probably due to the amount of work that she has assumed around the home and lifting and in working.  She does not do regular exercise she does not participate in any physical therapy for her back at this time. Because of her gastroesophageal reflux she does not take nonsteroidals. She also has hypothyroidism and hyperlipidemia on medications   Review of Systems  Constitutional: Positive for fatigue. Negative for activity change and appetite change.  HENT: Negative for ear pain, congestion, neck pain, postnasal drip and sinus pressure.   Eyes: Negative for redness and visual disturbance.  Respiratory: Negative for cough, shortness of breath and wheezing.   Gastrointestinal: Negative for abdominal pain and abdominal distention.  Genitourinary: Negative for dysuria, frequency and menstrual problem.  Musculoskeletal: Positive for myalgias and back pain. Negative for joint swelling and arthralgias.  Skin: Negative for rash and wound.  Neurological: Negative for dizziness, weakness and headaches.  Hematological: Negative for adenopathy. Does not bruise/bleed easily.  Psychiatric/Behavioral: Positive for dysphoric mood. Negative for sleep disturbance and decreased concentration.   Past Medical History  Diagnosis Date  . Coronary atherosclerosis of unspecified type of vessel, native or graft   . Unspecified essential hypertension   . Hyperlipidemia   . GERD (gastroesophageal reflux disease)   . Thyroid disease   . Urinary tract infection, site not specified   . Disorder of bone and  cartilage, unspecified   . Unspecified adverse effect of unspecified drug, medicinal and biological substance   . Adjustment disorder with anxiety   . Candidiasis of skin and nails   . Family history of malignant neoplasm of genital organ, other   . Personal history of other diseases of digestive system   . Internal hemorrhoids without mention of complication     History   Social History  . Marital Status: Married    Spouse Name: N/A    Number of Children: N/A  . Years of Education: N/A   Occupational History  . retired    Social History Main Topics  . Smoking status: Former Games developer  . Smokeless tobacco: Not on file  . Alcohol Use: No  . Drug Use: No  . Sexually Active: Not Currently   Other Topics Concern  . Not on file   Social History Narrative  . No narrative on file    Past Surgical History  Procedure Date  . Coronary stent placement   . Abdominal hysterectomy   . Cholecystectomy     Family History  Problem Relation Age of Onset  . Heart disease Mother   . Cancer Mother     Allergies  Allergen Reactions  . Aspirin   . Bimatoprost   . Levofloxacin   . Loratadine   . Penicillins   . Rofecoxib   . Statins     Current Outpatient Prescriptions on File Prior to Visit  Medication Sig Dispense Refill  . atenolol-chlorthalidone (TENORETIC) 50-25 MG per tablet Take 1 tablet by mouth daily.  90 tablet  3  . esomeprazole (NEXIUM) 40 MG capsule Take 1 capsule (40 mg total) by mouth daily.  90 capsule  3  .  estrogens, conjugated, (PREMARIN) 0.625 MG tablet Take 1 tablet (0.625 mg total) by mouth daily. Take daily for 21 days then do not take for 7 days.  30 tablet  6  . fish oil-omega-3 fatty acids 1000 MG capsule Take 2 g by mouth 3 (three) times daily.        . Lactobacillus (ACIDOPHILUS) 10 MG CAPS Take by mouth 2 (two) times daily.       Marland Kitchen oxybutynin (DITROPAN) 5 MG tablet Take 5 mg by mouth 2 (two) times daily.       Marland Kitchen pyridOXINE (VITAMIN B-6) 100 MG  tablet Take 100 mg by mouth daily.        Marland Kitchen SYNTHROID 125 MCG tablet TAKE ONE TABLET BY MOUTH EVERY DAY  90 each  3  . vitamin B-12 (CYANOCOBALAMIN) 500 MCG tablet Take 500 mcg by mouth daily.        . vitamin E 400 UNIT capsule Take 400 Units by mouth daily.        . WELCHOL 3.75 G PACK MIX ONE PACKET IN JUICE OR WATER AND DRINK ONE PER DAY  30 each  6    BP 146/84  Pulse 76  Temp 98.2 F (36.8 C)  Resp 16  Ht 4\' 11"  (1.499 m)  Wt 143 lb (64.864 kg)  BMI 28.88 kg/m2       Objective:   Physical Exam  Vitals reviewed. Constitutional: She is oriented to person, place, and time. She appears well-developed and well-nourished. No distress.  HENT:  Head: Normocephalic and atraumatic.  Right Ear: External ear normal.  Left Ear: External ear normal.  Nose: Nose normal.  Mouth/Throat: Oropharynx is clear and moist.  Eyes: Conjunctivae and EOM are normal. Pupils are equal, round, and reactive to light.  Neck: Normal range of motion. Neck supple. No JVD present. No tracheal deviation present. No thyromegaly present.  Cardiovascular: Normal rate, regular rhythm, normal heart sounds and intact distal pulses.   No murmur heard. Pulmonary/Chest: Effort normal and breath sounds normal. She has no wheezes. She exhibits no tenderness.  Abdominal: Soft. Bowel sounds are normal.  Musculoskeletal: Normal range of motion. She exhibits tenderness. She exhibits no edema.  Lymphadenopathy:    She has no cervical adenopathy.  Neurological: She is alert and oriented to person, place, and time. She has normal reflexes. No cranial nerve deficit.  Skin: Skin is warm and dry. She is not diaphoretic.  Psychiatric: She has a normal mood and affect. Her behavior is normal.          Assessment & Plan:  We have given her a list of back exercises to do for back pain as well as suggested the use of a heating pad and stretching.  She does not want to go to physical therapy at this point because of a cure  from her husband but we recommend physical therapy as a part of her treatment plan.  She is stable on her current dose of Synthroid.  Her blood pressure is stable on her current medications for GERD is stable and we do recommend staying away from nonsteroidals for back pain .

## 2012-04-07 ENCOUNTER — Ambulatory Visit (INDEPENDENT_AMBULATORY_CARE_PROVIDER_SITE_OTHER): Payer: Medicare Other | Admitting: Internal Medicine

## 2012-04-07 ENCOUNTER — Encounter: Payer: Self-pay | Admitting: Internal Medicine

## 2012-04-07 DIAGNOSIS — I10 Essential (primary) hypertension: Secondary | ICD-10-CM

## 2012-04-07 DIAGNOSIS — R634 Abnormal weight loss: Secondary | ICD-10-CM

## 2012-04-07 MED ORDER — NITROFURANTOIN MONOHYD MACRO 100 MG PO CAPS: 100.0000 mg | ORAL_CAPSULE | Freq: Two times a day (BID) | ORAL | Status: DC

## 2012-04-07 NOTE — Patient Instructions (Signed)
The patient is instructed to continue all medications as prescribed. Schedule followup with check out clerk upon leaving the clinic  

## 2012-04-21 ENCOUNTER — Encounter: Payer: Self-pay | Admitting: Gastroenterology

## 2012-05-04 ENCOUNTER — Other Ambulatory Visit: Payer: Self-pay | Admitting: Internal Medicine

## 2012-06-08 ENCOUNTER — Other Ambulatory Visit: Payer: Self-pay | Admitting: Internal Medicine

## 2012-06-14 ENCOUNTER — Other Ambulatory Visit: Payer: Self-pay | Admitting: *Deleted

## 2012-06-14 MED ORDER — ESOMEPRAZOLE MAGNESIUM 40 MG PO CPDR: 40.0000 mg | DELAYED_RELEASE_CAPSULE | Freq: Every day | ORAL | Status: DC

## 2012-07-07 ENCOUNTER — Encounter: Payer: Self-pay | Admitting: Internal Medicine

## 2012-07-07 ENCOUNTER — Ambulatory Visit (INDEPENDENT_AMBULATORY_CARE_PROVIDER_SITE_OTHER): Payer: Medicare Other | Admitting: Internal Medicine

## 2012-07-07 ENCOUNTER — Other Ambulatory Visit: Payer: Self-pay | Admitting: Internal Medicine

## 2012-07-07 VITALS — BP 140/78 | HR 64 | Temp 98.6°F | Resp 16 | Ht 59.0 in | Wt 134.0 lb

## 2012-07-07 DIAGNOSIS — F4329 Adjustment disorder with other symptoms: Secondary | ICD-10-CM

## 2012-07-07 DIAGNOSIS — K219 Gastro-esophageal reflux disease without esophagitis: Secondary | ICD-10-CM

## 2012-07-07 DIAGNOSIS — I10 Essential (primary) hypertension: Secondary | ICD-10-CM

## 2012-07-07 DIAGNOSIS — F4321 Adjustment disorder with depressed mood: Secondary | ICD-10-CM

## 2012-07-07 DIAGNOSIS — E785 Hyperlipidemia, unspecified: Secondary | ICD-10-CM

## 2012-07-07 NOTE — Progress Notes (Signed)
Subjective:    Patient ID: Joyce [name], female    DOB: 1931/12/01, 78 y.o.   MRN: [id number]  HPI patient has been doing generally well she is still struggling with grief of her husbands slow decline with Alzheimer's disease.  Her blood pressure is reasonably controlled she has increasing problems with bladder she does have a prolapsed bladder and hasn't had a CT scan and an evaluation by the urologist for possible intervention.      Review of Systems  Constitutional: Negative for activity change, appetite change and fatigue.  HENT: Negative for ear pain, congestion, neck pain, postnasal drip and sinus pressure.   Eyes: Negative for redness and visual disturbance.  Respiratory: Negative for cough, shortness of breath and wheezing.   Gastrointestinal: Negative for abdominal pain and abdominal distention.  Genitourinary: Negative for dysuria, frequency and menstrual problem.  Musculoskeletal: Negative for myalgias, joint swelling and arthralgias.  Skin: Negative for rash and wound.  Neurological: Negative for dizziness, weakness and headaches.  Hematological: Negative for adenopathy. Does not bruise/bleed easily.  Psychiatric/Behavioral: Negative for disturbed wake/sleep cycle and decreased concentration.   Past Medical History  Diagnosis Date  . Coronary atherosclerosis of unspecified type of vessel, native or graft   . Unspecified essential hypertension   . Hyperlipidemia   . GERD (gastroesophageal reflux disease)   . Thyroid disease   . Urinary tract infection, site not specified   . Disorder of bone and cartilage, unspecified   . Unspecified adverse effect of unspecified drug, medicinal and biological substance   . Adjustment disorder with anxiety   . Candidiasis of skin and nails   . Family history of malignant neoplasm of genital organ, other   . Personal history of other diseases of digestive system   . Internal hemorrhoids without mention of complication     History    Social History  . Marital Status: Married    Spouse Name: N/A    Number of Children: N/A  . Years of Education: N/A   Occupational History  . retired    Social History Main Topics  . Smoking status: Former Games developer  . Smokeless tobacco: Not on file  . Alcohol Use: No  . Drug Use: No  . Sexually Active: Not Currently   Other Topics Concern  . Not on file   Social History Narrative  . No narrative on file    Past Surgical History  Procedure Date  . Coronary stent placement   . Abdominal hysterectomy   . Cholecystectomy     Family History  Problem Relation Age of Onset  . Heart disease Mother   . Cancer Mother     Allergies  Allergen Reactions  . Aspirin   . Bimatoprost   . Levofloxacin   . Loratadine   . Penicillins   . Rofecoxib   . Statins     Current Outpatient Prescriptions on File Prior to Visit  Medication Sig Dispense Refill  . atenolol-chlorthalidone (TENORETIC) 50-25 MG per tablet TAKE ONE TABLET BY MOUTH DAILY  90 tablet  3  . Biotin (APPEAREX) 2.5 MG TABS Take 2.5 mg by mouth daily.      Marland Kitchen esomeprazole (NEXIUM) 40 MG capsule Take 1 capsule (40 mg total) by mouth daily.  90 capsule  3  . estrogens, conjugated, (PREMARIN) 0.625 MG tablet Take 1 tablet (0.625 mg total) by mouth daily. Take daily for 21 days then do not take for 7 days.  30 tablet  6  . fish oil-omega-3  fatty acids 1000 MG capsule Take 2 g by mouth 3 (three) times daily.        . Lactobacillus (ACIDOPHILUS) 10 MG CAPS Take by mouth 2 (two) times daily.       Marland Kitchen LORazepam (ATIVAN) 0.5 MG tablet Take 1 tablet (0.5 mg total) by mouth every 8 (eight) hours as needed for anxiety.  60 tablet  3  . nitroGLYCERIN (NITROSTAT) 0.4 MG SL tablet Place 1 tablet (0.4 mg total) under the tongue every 5 (five) minutes as needed for chest pain.  25 tablet  3  . oxybutynin (DITROPAN) 5 MG tablet Take 5 mg by mouth 2 (two) times daily.       Marland Kitchen pyridOXINE (VITAMIN B-6) 100 MG tablet Take 100 mg by mouth  daily.        Marland Kitchen SYNTHROID 125 MCG tablet TAKE ONE TABLET BY MOUTH EVERY DAY  90 each  3  . vitamin B-12 (CYANOCOBALAMIN) 500 MCG tablet Take 500 mcg by mouth daily.        . vitamin E 400 UNIT capsule Take 400 Units by mouth daily.        . WELCHOL 3.75 G PACK MIX ONE PACKET IN JUICE OR WATER AND DRINK ONE PER DAY  30 each  6    BP 140/78  Pulse 64  Temp 98.6 F (37 C)  Resp 16  Ht 4\' 11"  (1.499 m)  Wt 134 lb (60.782 kg)  BMI 27.06 kg/m2       Objective:   Physical Exam  Nursing note and vitals reviewed. Constitutional: She is oriented to person, place, and time. She appears well-developed and well-nourished. No distress.  HENT:  Head: Normocephalic and atraumatic.  Right Ear: External ear normal.  Left Ear: External ear normal.  Nose: Nose normal.  Mouth/Throat: Oropharynx is clear and moist.  Eyes: Conjunctivae and EOM are normal. Pupils are equal, round, and reactive to light.  Neck: Normal range of motion. Neck supple. No JVD present. No tracheal deviation present. No thyromegaly present.  Cardiovascular: Normal rate, regular rhythm, normal heart sounds and intact distal pulses.   No murmur heard. Pulmonary/Chest: Effort normal and breath sounds normal. She has no wheezes. She exhibits no tenderness.  Abdominal: Soft. Bowel sounds are normal.  Musculoskeletal: Normal range of motion. She exhibits no edema and no tenderness.  Lymphadenopathy:    She has no cervical adenopathy.  Neurological: She is alert and oriented to person, place, and time. She has normal reflexes. No cranial nerve deficit.  Skin: Skin is warm and dry. She is not diaphoretic.  Psychiatric: She has a normal mood and affect. Her behavior is normal.          Assessment & Plan:  Patient has multiple UTIs from the prolapse of her bladder.  Cystocele.  She is followed by urology they're discussing alternatives at this time a CT scan has been accomplished. Otherwise she is doing remarkably well  from my standpoint and dealing with the stress better than expected

## 2012-07-07 NOTE — Patient Instructions (Addendum)
The melatonin I recommend is 3 mg at bedtime

## 2012-07-09 ENCOUNTER — Other Ambulatory Visit (HOSPITAL_COMMUNITY): Payer: Self-pay | Admitting: Urology

## 2012-07-12 ENCOUNTER — Other Ambulatory Visit: Payer: Self-pay | Admitting: Urology

## 2012-07-19 ENCOUNTER — Ambulatory Visit (HOSPITAL_COMMUNITY)
Admission: RE | Admit: 2012-07-19 | Discharge: 2012-07-19 | Disposition: A | Discharge status: Home / Self Care | Payer: Medicare Other | Source: Ambulatory Visit | Attending: Urology | Admitting: Urology

## 2012-07-19 DIAGNOSIS — R300 Dysuria: Secondary | ICD-10-CM | POA: Insufficient documentation

## 2012-07-19 DIAGNOSIS — Z90710 Acquired absence of both cervix and uterus: Secondary | ICD-10-CM | POA: Insufficient documentation

## 2012-07-19 LAB — CREATININE, SERUM: Creatinine, Ser: 0.49 mg/dL — ABNORMAL LOW (ref 0.50–1.10)

## 2012-07-19 MED ORDER — GADOBENATE DIMEGLUMINE 529 MG/ML IV SOLN
12.0000 mL | Freq: Once | INTRAVENOUS | Status: AC | PRN
  Administered 2012-07-19: 12 mL via INTRAVENOUS

## 2012-07-20 ENCOUNTER — Ambulatory Visit (INDEPENDENT_AMBULATORY_CARE_PROVIDER_SITE_OTHER): Payer: Medicare Other | Admitting: Physician Assistant

## 2012-07-20 ENCOUNTER — Encounter: Payer: Self-pay | Admitting: Physician Assistant

## 2012-07-20 VITALS — BP 138/76 | HR 55 | Ht 59.0 in | Wt 133.8 lb

## 2012-07-20 DIAGNOSIS — I2510 Atherosclerotic heart disease of native coronary artery without angina pectoris: Secondary | ICD-10-CM

## 2012-07-20 DIAGNOSIS — R079 Chest pain, unspecified: Secondary | ICD-10-CM

## 2012-07-20 DIAGNOSIS — R002 Palpitations: Secondary | ICD-10-CM

## 2012-07-20 DIAGNOSIS — Z01810 Encounter for preprocedural cardiovascular examination: Secondary | ICD-10-CM

## 2012-07-20 DIAGNOSIS — R0602 Shortness of breath: Secondary | ICD-10-CM

## 2012-07-20 MED ORDER — ATENOLOL-CHLORTHALIDONE 50-25 MG PO TABS: 0.5000 | ORAL_TABLET | Freq: Two times a day (BID) | ORAL | Status: DC

## 2012-07-20 NOTE — Progress Notes (Signed)
[address] [place], [place]  [address] Phone: [phone] Fax:  [phone]  Date:  07/20/2012   Name:  Joyce [name]   DOB:  [birth year]   MRN:  [id number]  PCP:  Carrie Mew, MD  Primary Cardiologist:  Dr. Tonny Bollman  Primary Electrophysiologist:  None    History of Present Illness: Joyce [name] is a 78 y.o. female who returns for surgical clearance.    She has a history of PCI left circumflex in 2002. LHC 12/2002:  Proximal LAD 50%, proximal circumflex stents patent, OM1 proximal 30%, mid RCA 30%, EF 70%. Other history includes HTN, HL, and GERD, hypothyroidism. Last Myoview 02/2009: EF 83%, no scar or ischemia. Last seen by Dr. Excell Seltzer 02/2012.  She has been seen by Dr. Margarita Grizzle of urology for hematuria. CT scan is suggestive of a potential tumor and she needs cystoscopy and biopsy. Unfortunately, her husband is now in nursing facility. He has Alzheimer's dementia. She's under a great amount of stress. She's lost 30 pounds and has a very poor appetite. She gets chest tightness occasionally when she has increased emotional stress. She also notes increased palpitations. She describes it as a skipping sensation. She may notice it once or twice a week. She does note dyspnea with exertion. This is increased over the last several months. She denies orthopnea, PND. She has chronic left ankle edema after previous injury. She denies syncope.   Past Medical History  Diagnosis Date  . Coronary atherosclerosis of unspecified type of vessel, native or graft   . Unspecified essential hypertension   . Hyperlipidemia   . GERD (gastroesophageal reflux disease)   . Thyroid disease   . Urinary tract infection, site not specified   . Disorder of bone and cartilage, unspecified   . Unspecified adverse effect of unspecified drug, medicinal and biological substance   . Adjustment disorder with anxiety   . Candidiasis of skin and nails   . Family history of  malignant neoplasm of genital organ, other   . Personal history of other diseases of digestive system   . Internal hemorrhoids without mention of complication     Current Outpatient Prescriptions  Medication Sig Dispense Refill  . atenolol-chlorthalidone (TENORETIC) 50-25 MG per tablet TAKE ONE TABLET BY MOUTH DAILY  90 tablet  3  . Biotin (APPEAREX) 2.5 MG TABS Take 2.5 mg by mouth daily.      Marland Kitchen esomeprazole (NEXIUM) 40 MG capsule Take 1 capsule (40 mg total) by mouth daily.  90 capsule  3  . fish oil-omega-3 fatty acids 1000 MG capsule Take 2 g by mouth 3 (three) times daily.        . Lactobacillus (ACIDOPHILUS) 10 MG CAPS Take by mouth 2 (two) times daily.       Marland Kitchen LORazepam (ATIVAN) 0.5 MG tablet Take 1 tablet (0.5 mg total) by mouth every 8 (eight) hours as needed for anxiety.  60 tablet  3  . nitroGLYCERIN (NITROSTAT) 0.4 MG SL tablet Place 1 tablet (0.4 mg total) under the tongue every 5 (five) minutes as needed for chest pain.  25 tablet  3  . oxybutynin (DITROPAN) 5 MG tablet Take 5 mg by mouth 2 (two) times daily.       Marland Kitchen PREMARIN 0.625 MG tablet TAKE ONE TABLET BY MOUTH EVERY DAY FOR 21 DAYS,THEN DO NOT TAKE FOR 7 DAYS  30 each  6  . pyridOXINE (VITAMIN B-6) 100 MG tablet Take 100 mg by mouth  daily.        Marland Kitchen SYNTHROID 125 MCG tablet TAKE ONE TABLET BY MOUTH EVERY DAY  90 each  3  . vitamin B-12 (CYANOCOBALAMIN) 500 MCG tablet Take 500 mcg by mouth daily.        . vitamin E 400 UNIT capsule Take 400 Units by mouth daily.        . WELCHOL 3.75 G PACK MIX ONE PACKET IN JUICE OR WATER AND DRINK ONE PER DAY  30 each  6   No current facility-administered medications for this visit.   Facility-Administered Medications Ordered in Other Visits  Medication Dose Route Frequency Provider Last Rate Last Dose  . gadobenate dimeglumine (MULTIHANCE) injection 12 mL  12 mL Intravenous Once PRN Medication Radiologist, MD   12 mL at 07/19/12 1538    Allergies: Allergies  Allergen Reactions  .  Aspirin   . Bimatoprost   . Levofloxacin   . Loratadine   . Penicillins   . Rofecoxib   . Statins     History  Substance Use Topics  . Smoking status: Former Games developer  . Smokeless tobacco: Not on file  . Alcohol Use: No     ROS:  Please see the history of present illness.   She has a nonproductive cough today. She notes postnasal drip.  All other systems reviewed and negative.   PHYSICAL EXAM: VS:  BP 138/76  Pulse 55  Ht 4\' 11"  (1.499 m)  Wt 133 lb 12.8 oz (60.691 kg)  BMI 27.02 kg/m2 Well nourished, well developed, in no acute distress HEENT: normal Neck: no JVD Cardiac:  normal S1, S2; RRR; no murmur Lungs:  clear to auscultation bilaterally, no wheezing, rhonchi or rales Abd: soft, nontender, no hepatomegaly Ext: no edema Skin: warm and dry Neuro:  CNs 2-12 intact, no focal abnormalities noted  EKG:  sinus bradycardia, rate 55, left axis deviation, no change from prior tracing       ASSESSMENT AND PLAN:  1. CAD She is experiencing chest discomfort and dyspnea with exertion. She's been under a great deal of stress over the last year. She has a poor appetite.  I suspect her symptoms are related to stress. However, she needs to undergo urologic procedure in the near future.  I will arrange a Lexiscan Myoview.  If this is low risk, she can proceed with her procedure with acceptable risk.  2. Palpitations Suspect she is having PAC/PVCs associated with increased stress. Assess LVF with myoview as noted. Check BMET and TSH today. Change Tenoretic to 1/2 tab bid to see if this helps quiet palpitations. If symptoms continue or increase, consider event monitor.  3. Bladder tumor Cystoscopy and biopsy pending.  4.  Anxiety She does feel better with ativan prn. She can discuss further treatment with her PCP.  Signed, Tereso Newcomer, PA-C  2:20 PM 07/20/2012

## 2012-07-20 NOTE — Patient Instructions (Addendum)
Your physician has requested that you have a lexiscan myoview. For further information please visit [email or web address] Please follow instruction sheet, as given.  Your physician has recommended you make the following change in your medication: CHANGE ATENOLOL  TAKE 1/2 (HALF) TABLET TWICE DAILY  Your physician recommends that you return for lab work in: TODAY BMET, TSH  Your physician recommends that you schedule a follow-up appointment in: 3 MONTHS WITH DR. Excell Seltzer

## 2012-07-21 ENCOUNTER — Telehealth: Payer: Self-pay | Admitting: *Deleted

## 2012-07-21 LAB — BASIC METABOLIC PANEL
BUN: 9 mg/dL (ref 6–23)
GFR: 113.17 mL/min (ref >60.00)
Glucose, Bld: 84 mg/dL (ref 70–99)
Potassium: 3.2 mEq/L — ABNORMAL LOW (ref 3.5–5.1)

## 2012-07-21 NOTE — Telephone Encounter (Signed)
lmom ptcb to go over lab results

## 2012-07-21 NOTE — Telephone Encounter (Signed)
Message copied by Tarri Fuller on Wed Jul 21, 2012  5:11 PM ------      Message from: [place], [place] T      Created: Wed Jul 21, 2012  1:35 PM       Na and K+ low.      Likely due to HCTZ in Tenoretic.        -  Take K+ 40 mEq po x 1.        -  Stop Tenoretic.        -  Start Atenolol 25 mg BID.        -  Repeat BMET in one week.            TSH high.        -  Needs synthroid adjusted.  She is currently on Synthroid 125 mcg QD.        -  Increase Synthroid to 137 mcg QD.        -  Get follow up with PCP in 6-8 weeks for TSH.        -  Fax labs to PCP.      Tereso Newcomer, PA-C  1:34 PM 07/21/2012

## 2012-07-22 ENCOUNTER — Telehealth: Payer: Self-pay | Admitting: *Deleted

## 2012-07-22 DIAGNOSIS — I10 Essential (primary) hypertension: Secondary | ICD-10-CM

## 2012-07-22 MED ORDER — ATENOLOL 25 MG PO TABS: 25.0000 mg | ORAL_TABLET | Freq: Two times a day (BID) | ORAL | Status: DC

## 2012-07-22 MED ORDER — LEVOTHYROXINE SODIUM 137 MCG PO TABS: 137.0000 ug | ORAL_TABLET | Freq: Every day | ORAL | Status: DC

## 2012-07-22 NOTE — Telephone Encounter (Signed)
Message copied by Tarri Fuller on Thu Jul 22, 2012  2:57 PM ------      Message from: [place], [place] T      Created: Wed Jul 21, 2012  1:35 PM       Na and K+ low.      Likely due to HCTZ in Tenoretic.        -  Take K+ 40 mEq po x 1.        -  Stop Tenoretic.        -  Start Atenolol 25 mg BID.        -  Repeat BMET in one week.            TSH high.        -  Needs synthroid adjusted.  She is currently on Synthroid 125 mcg QD.        -  Increase Synthroid to 137 mcg QD.        -  Get follow up with PCP in 6-8 weeks for TSH.        -  Fax labs to PCP.      Tereso Newcomer, PA-C  1:34 PM 07/21/2012

## 2012-07-22 NOTE — Telephone Encounter (Signed)
pt notified of lab results and med changes, will send in new rx's for atenolol and synthroid, pt aware to d/c atenolol/hctz. pt aware to see PCP in 6-8 wks for tsh, will get bmet 8/6 with [facility]

## 2012-07-22 NOTE — Telephone Encounter (Signed)
Pt rtn your call and she will only be there til 915 she added her cell number to her file so if she doesn't answer home phone call cell number

## 2012-07-27 ENCOUNTER — Other Ambulatory Visit: Payer: Self-pay

## 2012-07-27 ENCOUNTER — Encounter (HOSPITAL_BASED_OUTPATIENT_CLINIC_OR_DEPARTMENT_OTHER): Payer: Self-pay | Admitting: *Deleted

## 2012-07-27 ENCOUNTER — Ambulatory Visit (HOSPITAL_COMMUNITY): Payer: Medicare Other | Attending: Cardiology | Admitting: Radiology

## 2012-07-27 ENCOUNTER — Ambulatory Visit (INDEPENDENT_AMBULATORY_CARE_PROVIDER_SITE_OTHER): Payer: Medicare Other | Admitting: *Deleted

## 2012-07-27 VITALS — BP 146/67 | HR 57 | Ht 59.0 in | Wt 131.0 lb

## 2012-07-27 DIAGNOSIS — R079 Chest pain, unspecified: Secondary | ICD-10-CM

## 2012-07-27 DIAGNOSIS — R0789 Other chest pain: Secondary | ICD-10-CM | POA: Insufficient documentation

## 2012-07-27 DIAGNOSIS — Z01810 Encounter for preprocedural cardiovascular examination: Secondary | ICD-10-CM

## 2012-07-27 DIAGNOSIS — R000 Tachycardia, unspecified: Secondary | ICD-10-CM | POA: Insufficient documentation

## 2012-07-27 DIAGNOSIS — R0989 Other specified symptoms and signs involving the circulatory and respiratory systems: Secondary | ICD-10-CM | POA: Insufficient documentation

## 2012-07-27 DIAGNOSIS — I10 Essential (primary) hypertension: Secondary | ICD-10-CM | POA: Insufficient documentation

## 2012-07-27 DIAGNOSIS — E785 Hyperlipidemia, unspecified: Secondary | ICD-10-CM | POA: Insufficient documentation

## 2012-07-27 DIAGNOSIS — Z87891 Personal history of nicotine dependence: Secondary | ICD-10-CM | POA: Insufficient documentation

## 2012-07-27 DIAGNOSIS — R5381 Other malaise: Secondary | ICD-10-CM | POA: Insufficient documentation

## 2012-07-27 DIAGNOSIS — R0609 Other forms of dyspnea: Secondary | ICD-10-CM | POA: Insufficient documentation

## 2012-07-27 DIAGNOSIS — I2510 Atherosclerotic heart disease of native coronary artery without angina pectoris: Secondary | ICD-10-CM

## 2012-07-27 DIAGNOSIS — Z8249 Family history of ischemic heart disease and other diseases of the circulatory system: Secondary | ICD-10-CM | POA: Insufficient documentation

## 2012-07-27 DIAGNOSIS — R0602 Shortness of breath: Secondary | ICD-10-CM

## 2012-07-27 HISTORY — PX: CARDIOVASCULAR STRESS TEST: SHX262

## 2012-07-27 LAB — BASIC METABOLIC PANEL
BUN: 6 mg/dL (ref 6–23)
CO2: 34 mEq/L — ABNORMAL HIGH (ref 19–32)
Chloride: 94 mEq/L — ABNORMAL LOW (ref 96–112)
Creatinine, Ser: 0.5 mg/dL (ref 0.4–1.2)

## 2012-07-27 MED ORDER — TECHNETIUM TC 99M TETROFOSMIN IV KIT
10.0000 | PACK | Freq: Once | INTRAVENOUS | Status: AC | PRN
  Administered 2012-07-27: 10 via INTRAVENOUS

## 2012-07-27 MED ORDER — TECHNETIUM TC 99M TETROFOSMIN IV KIT
30.0000 | PACK | Freq: Once | INTRAVENOUS | Status: AC | PRN
  Administered 2012-07-27: 30 via INTRAVENOUS

## 2012-07-27 MED ORDER — REGADENOSON 0.4 MG/5ML IV SOLN
0.4000 mg | Freq: Once | INTRAVENOUS | Status: AC
  Administered 2012-07-27: 0.4 mg via INTRAVENOUS

## 2012-07-27 NOTE — Progress Notes (Signed)
[facility] 3 NUCLEAR MED [address] [place] [address] [phone]  Cardiology Nuclear Med Study  Joyce [name] is a 78 y.o. female     MRN : [id number]     DOB: 1931-09-26  Procedure Date: 07/27/2012  Nuclear Med Background Indication for Stress Test:  Evaluation for Ischemia and Pending Surgical Clearance for Cystoscopy/Biopsy by Dr. Natalia Leatherwood History:  '02 PTCA/Stent-Cfx; '04 Cath:Patent Stent with n/o CAD, EF=70%; '10 MPS:No ischemia, EF=83%. Cardiac Risk Factors: Family History - CAD, History of Smoking, Hypertension and Lipids.  Symptoms:  Chest Tightness (last episode of chest discomfort was this am, prior to getting here, 4-5/10; none now), DOE, Fatigue and Rapid HR   Nuclear Pre-Procedure Caffeine/Decaff Intake:  None > 12 hrs NPO After: 10:30pm   Lungs:  Clear. O2 Sat: 97% on room air. IV 0.9% NS with Angio Cath:  22g  IV Site: L Antecubital x 1, tolerated well IV Started by:  Irean Hong, RN  Chest Size (in):  36 Cup Size: B  Height: 4\' 11"  (1.499 m)  Weight:  131 lb (59.421 kg)  BMI:  Body mass index is 26.46 kg/(m^2). Tech Comments:  Last dose atenolol x 24 hrs ago    Nuclear Med Study 1 or 2 day study: 1 day  Stress Test Type:  Lexiscan  Reading MD: Cassell Clement, MD  Order Authorizing Provider:  Tonny Bollman, MD, and Tereso Newcomer, PA-C  Resting Radionuclide: Technetium 64m Tetrofosmin  Resting Radionuclide Dose: 11.0 mCi   Stress Radionuclide:  Technetium 43m Tetrofosmin  Stress Radionuclide Dose: 32.8 mCi           Stress Protocol Rest HR: 57 Stress HR: 90  Rest BP: 146/67 Stress BP: 155/58  Exercise Time (min): n/a METS: n/a   Predicted Max HR: 141 bpm % Max HR: 63.83 bpm Rate Pressure Product: 21308   Dose of Adenosine (mg):  n/a Dose of Lexiscan: 0.4 mg  Dose of Atropine (mg): n/a Dose of Dobutamine: n/a mcg/kg/min (at max HR)  Stress Test Technologist: Smiley Houseman, CMA-N  Nuclear Technologist:  Doyne Keel, CNMT     Rest Procedure:  Myocardial perfusion imaging was performed at rest 45 minutes following the intravenous administration of Technetium 84m Tetrofosmin.  Rest ECG: 3-beat nonsustained V-tach with nonspecific T-wave changes.  Stress Procedure:  The patient received IV Lexiscan 0.4 mg over 15-seconds.  Technetium 75m Tetrofosmin injected at 30-seconds.  There were more diffuse ST-T wave changes with Lexiscan, ventricular couplet noted.  She also c/o chest pressure, 5/10, with Lexiscan.  Quantitative spect images were obtained after a 45 minute delay.  Stress ECG: Diffuse ST changes at baseline increase with lexiscan stress.  QPS Raw Data Images:  Normal; no motion artifact; normal heart/lung ratio. Stress Images:  Normal homogeneous uptake in all areas of the myocardium. Rest Images:  Normal homogeneous uptake in all areas of the myocardium. Subtraction (SDS):  No evidence of ischemia. Transient Ischemic Dilatation (Normal <1.22):  0.99 Lung/Heart Ratio (Normal <0.45):  0.38  Quantitative Gated Spect Images QGS EDV:  44 ml QGS ESV:  08 ml  Impression Exercise Capacity:  Lexiscan with no exercise. BP Response:  Normal blood pressure response. Clinical Symptoms:  Typical chest pain. ECG Impression:  Mild increase in diffuse ST segment  changes with Lexiscan. Comparison with Prior Nuclear Study: No images to compare  Overall Impression:  Normal stress nuclear study.  LV Ejection Fraction: 81%.  LV Wall Motion:  NL LV  Function; NL Wall Motion   [employer]

## 2012-07-28 ENCOUNTER — Telehealth: Payer: Self-pay | Admitting: Cardiovascular Disease

## 2012-07-28 NOTE — Telephone Encounter (Signed)
Left message to call back  

## 2012-07-28 NOTE — Telephone Encounter (Signed)
Pt needs to get results from her nuc study because she is to have a biopsy Monday and she can't have have it done if her nuc study is not clear and she needs time to cancel if she can not do it

## 2012-07-29 NOTE — Progress Notes (Signed)
Pt states on her way out for appt's all day, states call her 07-30-2012 at 0900.

## 2012-07-30 ENCOUNTER — Encounter (HOSPITAL_BASED_OUTPATIENT_CLINIC_OR_DEPARTMENT_OTHER): Payer: Self-pay | Admitting: *Deleted

## 2012-07-30 NOTE — Progress Notes (Signed)
NPO AFTER MN. ARRIVES AT 0615. NEEDS ISTAT. CURRENT EKG IN EPIC AND CHART. WILL TAKE ATENOLOL AND SYNTHROID AM OF SURG W/ SIP OF WATER.

## 2012-07-30 NOTE — Telephone Encounter (Signed)
New problem:  Per Joyce [name] from [facility]   Surgery on Monday 8/12  waiting on cardiac clearance .

## 2012-07-30 NOTE — Telephone Encounter (Signed)
Left a message to call back.

## 2012-07-30 NOTE — Telephone Encounter (Signed)
Stress test results with surgical clearance recommendations  faxed to [phone] att: Selita.

## 2012-07-30 NOTE — Telephone Encounter (Signed)
Follow-up:    Please call back about surgical clearance.

## 2012-08-02 ENCOUNTER — Ambulatory Visit (HOSPITAL_BASED_OUTPATIENT_CLINIC_OR_DEPARTMENT_OTHER): Payer: Medicare Other | Admitting: Anesthesiology

## 2012-08-02 ENCOUNTER — Encounter (HOSPITAL_BASED_OUTPATIENT_CLINIC_OR_DEPARTMENT_OTHER)
Admission: RE | Disposition: A | Discharge status: Home / Self Care | Payer: Self-pay | Source: Ambulatory Visit | Attending: Urology

## 2012-08-02 ENCOUNTER — Ambulatory Visit (HOSPITAL_BASED_OUTPATIENT_CLINIC_OR_DEPARTMENT_OTHER)
Admission: RE | Admit: 2012-08-02 | Discharge: 2012-08-02 | Disposition: A | Discharge status: Home / Self Care | Payer: Medicare Other | Source: Ambulatory Visit | Attending: Urology | Admitting: Urology

## 2012-08-02 ENCOUNTER — Encounter (HOSPITAL_BASED_OUTPATIENT_CLINIC_OR_DEPARTMENT_OTHER): Payer: Self-pay

## 2012-08-02 ENCOUNTER — Encounter (HOSPITAL_BASED_OUTPATIENT_CLINIC_OR_DEPARTMENT_OTHER): Payer: Self-pay | Admitting: Anesthesiology

## 2012-08-02 ENCOUNTER — Other Ambulatory Visit: Payer: Self-pay

## 2012-08-02 DIAGNOSIS — I2510 Atherosclerotic heart disease of native coronary artery without angina pectoris: Secondary | ICD-10-CM | POA: Insufficient documentation

## 2012-08-02 DIAGNOSIS — N289 Disorder of kidney and ureter, unspecified: Secondary | ICD-10-CM | POA: Insufficient documentation

## 2012-08-02 DIAGNOSIS — N329 Bladder disorder, unspecified: Secondary | ICD-10-CM

## 2012-08-02 DIAGNOSIS — K219 Gastro-esophageal reflux disease without esophagitis: Secondary | ICD-10-CM | POA: Insufficient documentation

## 2012-08-02 DIAGNOSIS — G4730 Sleep apnea, unspecified: Secondary | ICD-10-CM | POA: Insufficient documentation

## 2012-08-02 DIAGNOSIS — E039 Hypothyroidism, unspecified: Secondary | ICD-10-CM | POA: Insufficient documentation

## 2012-08-02 DIAGNOSIS — N3000 Acute cystitis without hematuria: Secondary | ICD-10-CM | POA: Insufficient documentation

## 2012-08-02 DIAGNOSIS — N3020 Other chronic cystitis without hematuria: Secondary | ICD-10-CM | POA: Insufficient documentation

## 2012-08-02 DIAGNOSIS — E785 Hyperlipidemia, unspecified: Secondary | ICD-10-CM | POA: Insufficient documentation

## 2012-08-02 HISTORY — DX: Anesthesia of skin: R20.0

## 2012-08-02 HISTORY — DX: Paresthesia of skin: R20.2

## 2012-08-02 HISTORY — PX: CYSTOSCOPY WITH BIOPSY: SHX5122

## 2012-08-02 HISTORY — DX: Personal history of other diseases of the nervous system and sense organs: Z86.69

## 2012-08-02 HISTORY — DX: Irritable bowel syndrome, unspecified: K58.9

## 2012-08-02 HISTORY — DX: Nocturia: R35.1

## 2012-08-02 HISTORY — DX: Mononeuropathy, unspecified: G58.9

## 2012-08-02 HISTORY — DX: Bladder disorder, unspecified: N32.9

## 2012-08-02 HISTORY — DX: Preglaucoma, unspecified, unspecified eye: H40.009

## 2012-08-02 HISTORY — DX: Stress incontinence (female) (male): N39.3

## 2012-08-02 HISTORY — DX: Hypothyroidism, unspecified: E03.9

## 2012-08-02 HISTORY — DX: Scoliosis, unspecified: M41.9

## 2012-08-02 LAB — POCT I-STAT 4, (NA,K, GLUC, HGB,HCT): HCT: 39 % (ref 36.0–46.0)

## 2012-08-02 SURGERY — CYSTOSCOPY, WITH BIOPSY
Anesthesia: General | Site: Bladder | Wound class: Clean Contaminated

## 2012-08-02 MED ORDER — ONDANSETRON HCL 4 MG/2ML IJ SOLN
INTRAMUSCULAR | Status: DC | PRN
  Administered 2012-08-02 (×2): 2 mg via INTRAVENOUS

## 2012-08-02 MED ORDER — FENTANYL CITRATE 0.05 MG/ML IJ SOLN
INTRAMUSCULAR | Status: DC | PRN
  Administered 2012-08-02: 50 ug via INTRAVENOUS

## 2012-08-02 MED ORDER — STERILE WATER FOR IRRIGATION IR SOLN
Status: DC | PRN
  Administered 2012-08-02: 3000 mL

## 2012-08-02 MED ORDER — NITROFURANTOIN MONOHYD MACRO 100 MG PO CAPS: 100.0000 mg | ORAL_CAPSULE | Freq: Two times a day (BID) | ORAL | Status: DC

## 2012-08-02 MED ORDER — PROMETHAZINE HCL 25 MG/ML IJ SOLN: 6.2500 mg | INTRAMUSCULAR | Status: DC | PRN

## 2012-08-02 MED ORDER — HYOSCYAMINE SULFATE 0.125 MG PO TABS: 0.1250 mg | ORAL_TABLET | ORAL | Status: DC | PRN

## 2012-08-02 MED ORDER — EPHEDRINE SULFATE 50 MG/ML IJ SOLN
INTRAMUSCULAR | Status: DC | PRN
  Administered 2012-08-02: 10 mg via INTRAVENOUS

## 2012-08-02 MED ORDER — FENTANYL CITRATE 0.05 MG/ML IJ SOLN: 25.0000 ug | INTRAMUSCULAR | Status: DC | PRN

## 2012-08-02 MED ORDER — LACTATED RINGERS IV SOLN
INTRAVENOUS | Status: DC
  Administered 2012-08-02: 07:00:00 via INTRAVENOUS
  Administered 2012-08-02: 100 mL/h via INTRAVENOUS

## 2012-08-02 MED ORDER — DEXAMETHASONE SODIUM PHOSPHATE 4 MG/ML IJ SOLN
INTRAMUSCULAR | Status: DC | PRN
  Administered 2012-08-02: 5 mg via INTRAVENOUS

## 2012-08-02 MED ORDER — BELLADONNA ALKALOIDS-OPIUM 16.2-60 MG RE SUPP
RECTAL | Status: DC | PRN
  Administered 2012-08-02: 1 via RECTAL

## 2012-08-02 MED ORDER — PROPOFOL 10 MG/ML IV EMUL
INTRAVENOUS | Status: DC | PRN
  Administered 2012-08-02: 130 mg via INTRAVENOUS

## 2012-08-02 MED ORDER — HYDROCODONE-ACETAMINOPHEN 5-325 MG PO TABS
1.0000 | ORAL_TABLET | ORAL | Status: DC | PRN
Start: 2012-08-02 — End: 2012-08-05

## 2012-08-02 MED ORDER — GENTAMICIN IN SALINE 1.6-0.9 MG/ML-% IV SOLN: 80.0000 mg | INTRAVENOUS | Status: DC

## 2012-08-02 MED ORDER — LIDOCAINE HCL 2 % EX GEL
CUTANEOUS | Status: DC | PRN
  Administered 2012-08-02: 1 via URETHRAL

## 2012-08-02 MED ORDER — LIDOCAINE HCL (CARDIAC) 20 MG/ML IV SOLN
INTRAVENOUS | Status: DC | PRN
  Administered 2012-08-02: 50 mg via INTRAVENOUS

## 2012-08-02 MED ORDER — PHENAZOPYRIDINE HCL 100 MG PO TABS: 100.0000 mg | ORAL_TABLET | Freq: Three times a day (TID) | ORAL | Status: DC | PRN

## 2012-08-02 SURGICAL SUPPLY — 17 items
BAG DRAIN URO-CYSTO SKYTR STRL (DRAIN) ×2 IMPLANT
BAG DRN UROCATH (DRAIN) ×1
CANISTER SUCT LVC 12 LTR MEDI- (MISCELLANEOUS) ×1 IMPLANT
CLOTH BEACON ORANGE TIMEOUT ST (SAFETY) ×2 IMPLANT
DRAPE CAMERA CLOSED 9X96 (DRAPES) ×2 IMPLANT
ELECT REM PT RETURN 9FT ADLT (ELECTROSURGICAL) ×2
ELECTRODE REM PT RTRN 9FT ADLT (ELECTROSURGICAL) ×1 IMPLANT
GLOVE BIO SURGEON STRL SZ 6.5 (GLOVE) ×1 IMPLANT
GLOVE ECLIPSE 6.0 STRL STRAW (GLOVE) ×1 IMPLANT
GLOVE ECLIPSE 7.0 STRL STRAW (GLOVE) ×2 IMPLANT
GLOVE INDICATOR 7.5 STRL GRN (GLOVE) ×2 IMPLANT
GOWN STRL NON-REIN LRG LVL3 (GOWN DISPOSABLE) ×1 IMPLANT
GOWN STRL REIN XL XLG (GOWN DISPOSABLE) ×2 IMPLANT
NEEDLE HYPO 22GX1.5 SAFETY (NEEDLE) IMPLANT
NS IRRIG 500ML POUR BTL (IV SOLUTION) IMPLANT
PACK CYSTOSCOPY (CUSTOM PROCEDURE TRAY) ×2 IMPLANT
WATER STERILE IRR 3000ML UROMA (IV SOLUTION) ×2 IMPLANT

## 2012-08-02 NOTE — Brief Op Note (Signed)
08/02/2012  7:53 AM  PATIENT:  Marikay Alar  78 y.o. female  PRE-OPERATIVE DIAGNOSIS:  BLADDER LESION  POST-OPERATIVE DIAGNOSIS:  BLADDER LESION  PROCEDURE:  Procedure(s) (LRB): CYSTOSCOPY WITH BIOPSY (N/A)  SURGEON:  Surgeon(s) and Role:    * Milford Cage, MD - Primary  PHYSICIAN ASSISTANT:   ASSISTANTS: none   ANESTHESIA:   general  EBL:  Total I/O In: 200 [I.V.:200] Out: -   BLOOD ADMINISTERED:none  DRAINS: none   LOCAL MEDICATIONS USED:  LIDOCAINE , Amount: 10 ml urethral jelly and OTHER B&O suppository.  SPECIMEN:  Source of Specimen:  bladder biopsy.  DISPOSITION OF SPECIMEN:  PATHOLOGY  COUNTS:  YES  TOURNIQUET:  * No tourniquets in log *  DICTATION: .Other Dictation: Dictation Number [id number]  PLAN OF CARE: Discharge to home after PACU  PATIENT DISPOSITION:  PACU - hemodynamically stable.   Delay start of Pharmacological VTE agent (>24hrs) due to surgical blood loss or risk of bleeding: no

## 2012-08-02 NOTE — H&P (Signed)
Urology History and Physical Exam  CC: Bladder lesion  HPI: 78 year old female presents today for cystoscopy, bladder biopsy.  On work up for  gross hematuria, and office cystoscopy revealed areas of erythema that could be CIS or inflammation. We have discussed the risks, benefits, alternatives, and likelihood of achieving goals. She was cleared by her Cardiologist, Dr. Tonny Bollman with [facility] after her Lexiscan stress test was normal.   PMH: Past Medical History  Diagnosis Date  . Unspecified essential hypertension   . Hyperlipidemia   . GERD (gastroesophageal reflux disease)   . Candidiasis of skin and nails   . Internal hemorrhoids without mention of complication   . Hypothyroidism   . IBS (irritable bowel syndrome)   . Coronary atherosclerosis of unspecified type of vessel, native or graft CARDIOLOGIST- DR [facility]    DENIES S & S  . Borderline glaucoma   . Adjustment disorder with anxiety STRESS CHEST DISCOMFORT    PT RECENTLY PUT HUSBAND IN NURSING HOME  . History of sleep apnea YRS AGO ON CPAP UNTIL LOST WT  . Pinched nerve LUMBAR    RESIDUAL LEFT FOOT NUMBNESS/ TINGLING  . Scoliosis   . Numbness and tingling of foot LEFT -- SECONDARY TO PINCHED LUMBAR NERVE  . Nocturia   . Stress incontinence   . Lesion of bladder     PSH: Past Surgical History  Procedure Date  . Vaginal hysterectomy 1968  . Cholecystectomy 1976  . Cataract extraction w/ intraocular lens  implant, bilateral   . Cardiac catheterization 01-10-2003   DR Chrissie Noa DOWNEY    PATENT CIRCUMFLEX STENT/ MODERATE CAD ELSEWHERE  . Coronary angioplasty with stent placement 08-31-2001   DR BRUCE BRODIE    STENTING OF PROXIMAL CIRCUMFLEX (75% TO 0%)   . Cardiovascular stress test 07-27-2012    NORMAL NUCLEAR STUDY/ LVEF 81%    Allergies: Allergies  Allergen Reactions  . Loratadine Other (See Comments)    Gi upset  . Adhesive (Tape) Other (See Comments)    Bruising and severe irritation  . Aspirin  Other (See Comments)    Gi upset  . Levofloxacin Nausea And Vomiting  . Lumigan (Bimatoprost) Other (See Comments)    Severe burning of eyes  . Mobic (Meloxicam) Nausea And Vomiting and Other (See Comments)    hallucinations  . Penicillins Diarrhea and Nausea And Vomiting  . Statins Other (See Comments)    Severe muscle pain    Medications: No prescriptions prior to admission     Social History: History   Social History  . Marital Status: Married    Spouse Name: N/A    Number of Children: N/A  . Years of Education: N/A   Occupational History  . retired    Social History Main Topics  . Smoking status: Former Smoker -- 1.5 packs/day for 20 years    Types: Cigarettes    Quit date: 07/31/1983  . Smokeless tobacco: Never Used  . Alcohol Use: No  . Drug Use: No  . Sexually Active: Not on file   Other Topics Concern  . Not on file   Social History Narrative  . No narrative on file    Family History: Family History  Problem Relation Age of Onset  . Heart disease Mother   . Cancer Mother     Review of Systems: Positive: Dysuria, chest tightness (no change since before cardiac work up). Negative: Fever, nausea, weakness.  A further 10 point review of systems was negative except what is listed  in the HPI.  Physical Exam:  General: No acute distress.  Awake. Head:  Normocephalic.  Atraumatic. ENT:  EOMI.  Mucous membranes moist Neck:  Supple.  No lymphadenopathy. CV:  S1 present. S2 present. Regular rate. Pulmonary: Equal effort bilaterally.  Clear to auscultation bilaterally. Abdomen: Soft.  Non- tender to palpation. Skin:  Normal turgor.  No visible rash. Extremity: No gross deformity of bilateral upper extremities.  No gross deformity of    bilateral lower extremities. Neurologic: Alert. Appropriate mood.    Studies:  No results found for this basename: HGB:2,WBC:2,PLT:2 in the last 72 hours  No results found for this basename:  NA:2,K:2,CL:2,CO2:2,BUN:2,CREATININE:2,CALCIUM:2,MAGNESIUM:2,GFRNONAA:2,GFRAA:2 in the last 72 hours   No results found for this basename: PT:2,INR:2,APTT:2 in the last 72 hours   No components found with this basename: ABG:2    Assessment:  Bladder lesions.  Plan: -To OR for cystoscopy, bladder biopsy.

## 2012-08-02 NOTE — Anesthesia Postprocedure Evaluation (Signed)
  Anesthesia Post-op Note  Patient: Joyce [name]  Procedure(s) Performed: Procedure(s) (LRB): CYSTOSCOPY WITH BIOPSY (N/A)  Patient Location: PACU  Anesthesia Type: General  Level of Consciousness: awake and alert   Airway and Oxygen Therapy: Patient Spontanous Breathing  Post-op Pain: mild  Post-op Assessment: Post-op Vital signs reviewed, Patient's Cardiovascular Status Stable, Respiratory Function Stable, Patent Airway and No signs of Nausea or vomiting  Post-op Vital Signs: stable  Complications: No apparent anesthesia complications. ECG unchanged. No cardiopulmonary symptoms.

## 2012-08-02 NOTE — Anesthesia Preprocedure Evaluation (Addendum)
Anesthesia Evaluation  Patient identified by MRN, date of birth, ID band Patient awake    Reviewed: Allergy & Precautions, H&P , NPO status , Patient's Chart, lab work & pertinent test results  Airway Mallampati: II TM Distance: >3 FB Neck ROM: Full    Dental  (+) Caps and Dental Advisory Given   Pulmonary neg pulmonary ROS,  breath sounds clear to auscultation  Pulmonary exam normal       Cardiovascular hypertension, Pt. on medications and Pt. on home beta blockers + CAD Rhythm:Regular Rate:Normal  Cardiology note reviewed. Recent normal lexican study.   Neuro/Psych PSYCHIATRIC DISORDERS Anxiety negative neurological ROS     GI/Hepatic Neg liver ROS, GERD-  Medicated,  Endo/Other  Hypothyroidism   Renal/GU negative Renal ROS  negative genitourinary   Musculoskeletal negative musculoskeletal ROS (+)   Abdominal   Peds negative pediatric ROS (+)  Hematology negative hematology ROS (+)   Anesthesia Other Findings   Reproductive/Obstetrics negative OB ROS                          Anesthesia Physical Anesthesia Plan  ASA: III  Anesthesia Plan: General   Post-op Pain Management:    Induction: Intravenous  Airway Management Planned: LMA  Additional Equipment:   Intra-op Plan:   Post-operative Plan: Extubation in OR  Informed Consent: I have reviewed the patients History and Physical, chart, labs and discussed the procedure including the risks, benefits and alternatives for the proposed anesthesia with the patient or authorized representative who has indicated his/her understanding and acceptance.   Dental advisory given  Plan Discussed with: CRNA  Anesthesia Plan Comments:         Anesthesia Quick Evaluation

## 2012-08-02 NOTE — Transfer of Care (Addendum)
Immediate Anesthesia Transfer of Care Note  Patient: Joyce [name]  Procedure(s) Performed: Procedure(s) (LRB): CYSTOSCOPY WITH BIOPSY (N/A)  Patient Location: PACU  Anesthesia Type: General  Level of Consciousness: awake, alert  and oriented  Airway & Oxygen Therapy: Patient Spontanous Breathing and Patient connected to face mask oxygen  Post-op Assessment: Report given to PACU RN and Post -op Vital signs reviewed and stable, EKG shows ST depression on PACU monitor.  Pt. States she feels OK.  No c/o chest pain or pressure.  No SOB.  12 lead EKG obtained.  Dr. Council Mechanic and Dr. Margarita Grizzle aware. 12 Lead EKG shows no changes when compared with the one on her chart from July 30,2013.  Post vital signs: Reviewed and stable  Complications: No apparent anesthesia complications

## 2012-08-02 NOTE — Anesthesia Procedure Notes (Signed)
Procedure Name: LMA Insertion Date/Time: 08/02/2012 7:38 AM Performed by: Norva Pavlov Pre-anesthesia Checklist: Patient identified, Emergency Drugs available, Suction available and Patient being monitored Patient Re-evaluated:Patient Re-evaluated prior to inductionOxygen Delivery Method: Circle System Utilized Preoxygenation: Pre-oxygenation with 100% oxygen Intubation Type: IV induction Ventilation: Mask ventilation without difficulty LMA: LMA inserted LMA Size: 3.0 Number of attempts: 1 Airway Equipment and Method: bite block Placement Confirmation: positive ETCO2 Tube secured with: Tape Dental Injury: Teeth and Oropharynx as per pre-operative assessment

## 2012-08-03 NOTE — Op Note (Signed)
NAMEEVOLET, SALMINEN NO.:  [id number]  MEDICAL RECORD NO.:  [id number]  LOCATION:                               FACILITY:  [facility]  PHYSICIAN:  Natalia Leatherwood, MD    DATE OF BIRTH:  [birth year]  DATE OF PROCEDURE:  08/02/2012 DATE OF DISCHARGE:                              OPERATIVE REPORT   SURGEON:  Natalia Leatherwood, MD  ASSISTANT:  None.  PREOPERATIVE DIAGNOSIS:  Bladder lesions.  POSTOP DIAGNOSIS:  Bladder lesions.  PROCEDURES PERFORMED: 1. Cystoscopy. 2. Panendoscopy. 3. Bladder biopsy. 4. Fulguration of bladder biopsy sites.  COMPLICATIONS:  None.  ESTIMATED BLOOD LOSS:  None.  FINDINGS:  Areas of erythema in the posterior bladder wall.  The posterior left bladder wall and the left lateral bladder wall.  SPECIMEN:  Bladder biopsy sent for pathology.  COMPLICATIONS:  None.  DRAINS:  None.  HISTORY OF PRESENT ILLNESS:  A 78 year old female who has a history of lower urinary tract symptoms.  She also had gross hematuria.  Workup for this revealed some erythematous areas in her bladder.  I recommended biopsy of these areas to ensure they were not carcinoma in situ.  She presents today for that procedure.  PROCEDURE:  Informed consent was obtained.  The patient was taken to the operating room.  She was placed in supine position.  IV antibiotics were infused and general anesthesia was induced.  SCDs were in place and turned on prior to the induction of anesthesia.  She was then placed in dorsal lithotomy position making sure to pad all pertinent neurovascular pressure points appropriately.  Following this, the genitals were prepped and draped in usual sterile fashion.  Time-out was performed which the correct patient, surgical site, and procedure were identified and agreed upon by the team.  The rigid cystoscope was advanced through the urethra into the bladder.  I did palpate along the length of the urethra with the cystoscope in place and did  not palpate any areas over the urethra that would indicate a diverticulum.  I also did not see any orifice or diverticulum in the bladder and the urethra.  The bladder was distended fully and evaluated in a systematic fashion with a 12 degree and 70-degree lens.  There were noted to be erythematous areas on the posterior bladder wall, posterior left bladder wall, and the left lateral bladder wall.  Both ureteral orifices were visualized and seem to be effluxing clear yellow urine.  Next a cold cup biopsy forceps was used to biopsy the superficial portion of the bladder and the posterior bladder wall, posterior left bladder wall, and the left lateral bladder wall.  All of these areas were sent for pathology separately.  Bugbee electrode was used to fulgurate the areas of biopsy.  All biopsy areas were superficial and did not have any signs of visualization of fat or otherwise any signs of perforation.  Because of this, I drained the bladder did not feel that catheter need to be left in place.  The belladonna and opium suppository was placed into her rectum and 10 mL of lidocaine jelly were placed into her urethra.  She will follow up as scheduled.  We will contact her with results of biopsy.          ______________________________ Natalia Leatherwood, MD     DW/MEDQ  D:  08/02/2012  T:  08/03/2012  Job:  [id number]

## 2012-08-04 ENCOUNTER — Encounter (HOSPITAL_BASED_OUTPATIENT_CLINIC_OR_DEPARTMENT_OTHER): Payer: Self-pay | Admitting: Urology

## 2012-08-04 ENCOUNTER — Other Ambulatory Visit: Payer: Self-pay | Admitting: Physician Assistant

## 2012-08-05 ENCOUNTER — Emergency Department (HOSPITAL_COMMUNITY): Payer: Medicare Other

## 2012-08-05 ENCOUNTER — Emergency Department (HOSPITAL_COMMUNITY)
Admission: EM | Admit: 2012-08-05 | Discharge: 2012-08-06 | Disposition: A | Discharge status: Home / Self Care | Payer: Medicare Other | Attending: Emergency Medicine | Admitting: Emergency Medicine

## 2012-08-05 ENCOUNTER — Other Ambulatory Visit: Payer: Self-pay

## 2012-08-05 ENCOUNTER — Encounter (HOSPITAL_COMMUNITY): Payer: Self-pay | Admitting: *Deleted

## 2012-08-05 DIAGNOSIS — R5381 Other malaise: Secondary | ICD-10-CM | POA: Insufficient documentation

## 2012-08-05 DIAGNOSIS — R0602 Shortness of breath: Secondary | ICD-10-CM | POA: Insufficient documentation

## 2012-08-05 DIAGNOSIS — Z79899 Other long term (current) drug therapy: Secondary | ICD-10-CM | POA: Insufficient documentation

## 2012-08-05 DIAGNOSIS — R079 Chest pain, unspecified: Secondary | ICD-10-CM

## 2012-08-05 DIAGNOSIS — K219 Gastro-esophageal reflux disease without esophagitis: Secondary | ICD-10-CM | POA: Insufficient documentation

## 2012-08-05 DIAGNOSIS — I2510 Atherosclerotic heart disease of native coronary artery without angina pectoris: Secondary | ICD-10-CM | POA: Insufficient documentation

## 2012-08-05 DIAGNOSIS — R0789 Other chest pain: Secondary | ICD-10-CM | POA: Insufficient documentation

## 2012-08-05 DIAGNOSIS — I10 Essential (primary) hypertension: Secondary | ICD-10-CM | POA: Insufficient documentation

## 2012-08-05 DIAGNOSIS — E7800 Pure hypercholesterolemia, unspecified: Secondary | ICD-10-CM | POA: Insufficient documentation

## 2012-08-05 DIAGNOSIS — E785 Hyperlipidemia, unspecified: Secondary | ICD-10-CM | POA: Insufficient documentation

## 2012-08-05 DIAGNOSIS — E039 Hypothyroidism, unspecified: Secondary | ICD-10-CM | POA: Insufficient documentation

## 2012-08-05 DIAGNOSIS — Z87891 Personal history of nicotine dependence: Secondary | ICD-10-CM | POA: Insufficient documentation

## 2012-08-05 LAB — COMPREHENSIVE METABOLIC PANEL
ALT: 28 U/L (ref 0–35)
AST: 33 U/L (ref 0–37)
CO2: 28 mEq/L (ref 19–32)
Calcium: 9.4 mg/dL (ref 8.4–10.5)
GFR calc non Af Amer: >90 mL/min (ref >90)
Sodium: 140 mEq/L (ref 135–145)

## 2012-08-05 LAB — POCT I-STAT TROPONIN I: Troponin i, poc: 0 ng/mL (ref 0.00–0.08)

## 2012-08-05 LAB — CBC WITH DIFFERENTIAL/PLATELET
Basophils Absolute: 0 10*3/uL (ref 0.0–0.1)
Eosinophils Relative: 4 % (ref 0–5)
Lymphocytes Relative: 19 % (ref 12–46)
MCV: 90 fL (ref 78.0–100.0)
Neutro Abs: 3.3 10*3/uL (ref 1.7–7.7)
Neutrophils Relative %: 66 % (ref 43–77)
Platelets: 186 10*3/uL (ref 150–400)
RDW: 13 % (ref 11.5–15.5)
WBC: 5 10*3/uL (ref 4.0–10.5)

## 2012-08-05 MED ORDER — HYDRALAZINE HCL 20 MG/ML IJ SOLN
10.0000 mg | Freq: Once | INTRAMUSCULAR | Status: AC
  Administered 2012-08-05: 10 mg via INTRAVENOUS
  Filled 2012-08-05: qty 1

## 2012-08-05 MED ORDER — LORAZEPAM 1 MG PO TABS
0.5000 mg | ORAL_TABLET | Freq: Once | ORAL | Status: AC
  Administered 2012-08-05: 0.5 mg via ORAL
  Filled 2012-08-05: qty 1

## 2012-08-05 NOTE — ED Notes (Signed)
Has felt fluttering in her chest off and on today. Also having shortness of breath and weakness with episodes. Diarrhea x1 today. Feelings have currently subsided. NSR on the monitor.

## 2012-08-05 NOTE — ED Provider Notes (Addendum)
History     CSN: [id number]  Arrival date & time 08/05/12  2118   First MD Initiated Contact with Patient 08/05/12 2148      Chief Complaint  Patient presents with  . Weakness    (Consider location/radiation/quality/duration/timing/severity/associated sxs/prior treatment) HPI Comments: Joyce [name] is a 78 y.o. Female hx of CAD status post one stent, hypertension, high cholesterol, presenting with shortness of breath and diaphoresis. Today she felt some substernal chest pain with shortness of breath after having some diarrhea for an hour. It lasted several minutes and resolved spontaneously. Around 7 PM she had another episode of diaphoresis and substernal chest pain lasted about 30 minutes. Chest pain is exertional and worse when she walks to the bathroom. She recently had a biopsy of the bladder to rule out cancer. She had a recent normal nuclear stress test a week ago.   The history is provided by the patient.    Past Medical History  Diagnosis Date  . Unspecified essential hypertension   . Hyperlipidemia   . GERD (gastroesophageal reflux disease)   . Candidiasis of skin and nails   . Internal hemorrhoids without mention of complication   . Hypothyroidism   . IBS (irritable bowel syndrome)   . Coronary atherosclerosis of unspecified type of vessel, native or graft CARDIOLOGIST- DR [facility]    DENIES S & S  . Borderline glaucoma   . Adjustment disorder with anxiety STRESS CHEST DISCOMFORT    PT RECENTLY PUT HUSBAND IN NURSING HOME  . History of sleep apnea YRS AGO ON CPAP UNTIL LOST WT  . Pinched nerve LUMBAR    RESIDUAL LEFT FOOT NUMBNESS/ TINGLING  . Scoliosis   . Numbness and tingling of foot LEFT -- SECONDARY TO PINCHED LUMBAR NERVE  . Nocturia   . Stress incontinence   . Lesion of bladder     Past Surgical History  Procedure Date  . Vaginal hysterectomy 1968  . Cholecystectomy 1976  . Cataract extraction w/ intraocular lens  implant, bilateral   . Cardiac  catheterization 01-10-2003   DR Chrissie Noa DOWNEY    PATENT CIRCUMFLEX STENT/ MODERATE CAD ELSEWHERE  . Coronary angioplasty with stent placement 08-31-2001   DR BRUCE BRODIE    STENTING OF PROXIMAL CIRCUMFLEX (75% TO 0%)   . Cardiovascular stress test 07-27-2012    NORMAL NUCLEAR STUDY/ LVEF 81%  . Cystoscopy with biopsy 08/02/2012    Procedure: CYSTOSCOPY WITH BIOPSY;  Surgeon: Milford Cage, MD;  [facility];  Service: Urology;  Laterality: N/A;    Family History  Problem Relation Age of Onset  . Heart disease Mother   . Cancer Mother     History  Substance Use Topics  . Smoking status: Former Smoker -- 1.5 packs/day for 20 years    Types: Cigarettes    Quit date: 07/31/1983  . Smokeless tobacco: Never Used  . Alcohol Use: No    OB History    Grav Para Term Preterm Abortions TAB SAB Ect Mult Living                  Review of Systems  Respiratory: Positive for chest tightness and shortness of breath.   All other systems reviewed and are negative.    Allergies  Loratadine; Adhesive; Aspirin; Levofloxacin; Lumigan; Mobic; Penicillins; and Statins  Home Medications   Current Outpatient Rx  Name Route Sig Dispense Refill  . ATENOLOL 25 MG PO TABS Oral Take 1 tablet (25 mg total) by  mouth 2 (two) times daily. 60 tablet 11  . BIOTIN 2.5 MG PO TABS Oral Take 2.5 mg by mouth daily.    . COLESEVELAM HCL 3.75 G PO PACK Oral Take 1 each by mouth daily.    Marland Kitchen ESOMEPRAZOLE MAGNESIUM 40 MG PO CPDR Oral Take 40 mg by mouth every other day.     . ESTROGENS CONJUGATED 0.625 MG PO TABS Oral Take 0.625 mg by mouth 3 (three) times a week.     . OMEGA-3 FATTY ACIDS 1000 MG PO CAPS Oral Take 1 g by mouth 3 (three) times daily.    Marland Kitchen HYOSCYAMINE SULFATE 0.125 MG PO TABS Oral Take 1 tablet (0.125 mg total) by mouth every 4 (four) hours as needed for cramping (bladder spasms). 40 tablet 4  . ACIDOPHILUS 10 MG PO CAPS Oral Take 2 capsules by mouth daily.     Marland Kitchen  LEVOTHYROXINE SODIUM 137 MCG PO TABS Oral Take 137 mcg by mouth every morning.    Marland Kitchen LORAZEPAM 0.5 MG PO TABS Oral Take 0.25 mg by mouth at bedtime as needed. For sleep    . LORAZEPAM 0.5 MG PO TABS Oral Take 0.25 mg by mouth at bedtime as needed. For sleep    . NITROGLYCERIN 0.4 MG SL SUBL Sublingual Place 1 tablet (0.4 mg total) under the tongue every 5 (five) minutes as needed for chest pain. 25 tablet 3  . OXYBUTYNIN CHLORIDE 5 MG PO TABS Oral Take 5 mg by mouth 2 (two) times daily.     Marland Kitchen PHENAZOPYRIDINE HCL 100 MG PO TABS Oral Take 1 tablet (100 mg total) by mouth every 8 (eight) hours as needed for pain (Burning urination.  Will turn urine and body fluids orange.). 30 tablet 1  . ALIGN PO Oral Take 1 capsule by mouth daily.    Marland Kitchen PYRIDOXINE HCL 50 MG PO TABS Oral Take 50 mg by mouth daily.    Marland Kitchen VITAMIN B-12 500 MCG PO TABS Oral Take 500 mcg by mouth daily.     Marland Kitchen VITAMIN E 400 UNITS PO CAPS Oral Take 400 Units by mouth daily.      BP 171/60  Pulse 59  Temp 98.2 F (36.8 C) (Oral)  Resp 21  SpO2 96%  Physical Exam  Nursing note and vitals reviewed. Constitutional: She is oriented to person, place, and time. She appears well-developed and well-nourished.       NAD, calm  HENT:  Head: Normocephalic and atraumatic.  Eyes: Conjunctivae and EOM are normal. Pupils are equal, round, and reactive to light.  Neck: Normal range of motion. Neck supple.  Cardiovascular: Normal rate, regular rhythm and normal heart sounds.   Pulmonary/Chest:       + crackles on bilateral bases no wheezing  Abdominal: Soft. Bowel sounds are normal.  Musculoskeletal:       1+ edema bilateral legs, no calf tenderness.   Neurological: She is alert and oriented to person, place, and time.  Skin: Skin is warm and dry.  Psychiatric: She has a normal mood and affect. Her behavior is normal. Judgment and thought content normal.    ED Course  Procedures (including critical care time)  Labs Reviewed    COMPREHENSIVE METABOLIC PANEL - Abnormal; Notable for the following:    Glucose, Bld 122 (*)     Creatinine, Ser 0.46 (*)     All other components within normal limits  CBC WITH DIFFERENTIAL  TROPONIN I  PRO B NATRIURETIC PEPTIDE   Dg Chest  2 View  08/05/2012  *RADIOLOGY REPORT*  Clinical Data: Weakness, hypertension.  CHEST - 2 VIEW  Comparison: 10/23/2005  Findings: Lungs are hyperinflated.  Bibasilar interstitial opacities, left greater than right, new since previous exam.  No effusion.  Mild cardiomegaly.  Vascular clips in the right upper abdomen.  Mild spondylitic changes in the mid thoracic spine.  IMPRESSION:  1.  Asymmetric basilar interstitial opacities, left greater than right.  Original Report Authenticated By: Osa Craver, M.D.     No diagnosis found.   Date: 08/05/2012  Rate: 63  Rhythm: normal sinus rhythm  QRS Axis: normal  Intervals: normal  ST/T Wave abnormalities: T wave inversion laterally and interiorly  Conduction Disutrbances:L anterior facicular block  Narrative Interpretation:   Old EKG Reviewed: unchanged     MDM  Joyce [name] is a 78 y.o. female hx of CAD, HL, HTN here with chest pain, SOB, and diaphoresis. She will need CBC, CMP, Trop. She also has crackles on bases and will get chest xray. Will likely put patient on CDU chest pain protocol.   11:13 PM Labs reviewed, cbc, cmp nl, trop neg x 1, bnp neg. CXR showed asymmetric basilar opacities. I discussed with the [facility] on call cardiologist. He mentioned that since patient had a recent nl stress echo, she can go to the CDU for r/o with serial troponins. I ordered hydralazine for HTN. I gave report to the CDU PA.        Richardean Canal, MD 08/05/12 2316  Richardean Canal, MD 08/05/12 [phone]

## 2012-08-06 ENCOUNTER — Encounter: Payer: Self-pay | Admitting: Family Medicine

## 2012-08-06 ENCOUNTER — Ambulatory Visit (INDEPENDENT_AMBULATORY_CARE_PROVIDER_SITE_OTHER): Payer: Medicare Other | Admitting: Family Medicine

## 2012-08-06 ENCOUNTER — Other Ambulatory Visit: Payer: Self-pay

## 2012-08-06 VITALS — BP 120/80 | Temp 98.2°F | Wt 132.0 lb

## 2012-08-06 DIAGNOSIS — R0602 Shortness of breath: Secondary | ICD-10-CM

## 2012-08-06 DIAGNOSIS — I10 Essential (primary) hypertension: Secondary | ICD-10-CM

## 2012-08-06 LAB — POCT I-STAT TROPONIN I: Troponin i, poc: 0.01 ng/mL (ref 0.00–0.08)

## 2012-08-06 MED ORDER — LEVOTHYROXINE SODIUM 137 MCG PO TABS: 137.0000 ug | ORAL_TABLET | Freq: Every morning | ORAL | Status: DC

## 2012-08-06 NOTE — Progress Notes (Signed)
  Subjective:    Patient ID: Joyce [name], female    DOB: 1931-08-06, 78 y.o.   MRN: [id number]  HPI Here to follow up an ER visit last night for SOB and chest pressure. Her workup was all negative with normal CXR, EKG, and labs. Of note she had a normal cardiac stress test on 07-27-12. She also had a bladder biopsy earlier this week, and it sounds like she has been very stressed all week. It also came to light that she had been taking Atenolol only once a day instead of twice. Today she feels better. No cough or fever. No urinary symptoms (she had been put on prophylactic Macrobid by Urology).    Review of Systems  Constitutional: Positive for fatigue. Negative for fever and unexpected weight change.  Respiratory: Negative.   Cardiovascular: Negative.   Gastrointestinal: Negative.   Genitourinary: Negative.   Neurological: Negative.        Objective:   Physical Exam  Constitutional: She appears well-developed and well-nourished.  Neck: No thyromegaly present.  Cardiovascular: Normal rate, regular rhythm, normal heart sounds and intact distal pulses.   Pulmonary/Chest: Effort normal and breath sounds normal.  Abdominal: Soft. She exhibits no distension and no mass. There is no tenderness. There is no rebound and no guarding.  Lymphadenopathy:    She has no cervical adenopathy.          Assessment & Plan:  She seems to be doing well today. I think she had felt badly from a combination of things including anxiety and maybe some elevated BP. She will follow up as needed.

## 2012-08-06 NOTE — ED Notes (Signed)
[name] [phone] (Daughter)

## 2012-08-08 ENCOUNTER — Telehealth: Payer: Self-pay | Admitting: Adult Health

## 2012-08-08 NOTE — Telephone Encounter (Signed)
Mrs. Csaszar called complaining of frequent diarrhea and trembling with diaphoresis associated with violent bowel movements. She felt was related to medication changes that were completed prior to a bladder biopsy. She was taken off of HCTZ and left only on atenolol 25 mg twice a day. She was actually in the emergency room last week with hypertension and anxiety. The patient was treated and released with no medication changes. She was seen by primary care physician Dr. fine who felt that her blood pressure and GI issues were related to stress and anxiety he continue to treat her with her current medication regimen. She calls today with recurrent diarrhea trembling and diaphoresis which she is concerned is related to the medication changes prior to having bladder biopsy. She is asking for recommendations on her medications and whether or not she needs to restart medications at her prior doses.   I have advised her to followup with her primary care physician. I do not feel this is a cardiac issue although medications were adjusted prior to biopsy. Her blood pressure has remained normal despite her symptoms. She states she has recently been treated for urinary tract infection with antibiotics. She may be having C. Difficile Infection and should be followed by primary care. I have asked her to call her primary care physician today or see him in the office next week for continued management and testing. She verbalized understanding.

## 2012-08-09 ENCOUNTER — Telehealth: Payer: Self-pay | Admitting: Internal Medicine

## 2012-08-09 ENCOUNTER — Encounter: Payer: Self-pay | Admitting: Family Medicine

## 2012-08-09 ENCOUNTER — Ambulatory Visit (INDEPENDENT_AMBULATORY_CARE_PROVIDER_SITE_OTHER): Payer: Medicare Other | Admitting: Family Medicine

## 2012-08-09 VITALS — BP 134/78 | HR 58 | Temp 97.8°F | Wt 134.0 lb

## 2012-08-09 DIAGNOSIS — R300 Dysuria: Secondary | ICD-10-CM

## 2012-08-09 DIAGNOSIS — R079 Chest pain, unspecified: Secondary | ICD-10-CM

## 2012-08-09 LAB — POCT URINALYSIS DIPSTICK
Protein, UA: NEGATIVE
Spec Grav, UA: <=1.005
Urobilinogen, UA: 0.2

## 2012-08-09 MED ORDER — CIPROFLOXACIN HCL 500 MG PO TABS: 500.0000 mg | ORAL_TABLET | Freq: Two times a day (BID) | ORAL | Status: AC

## 2012-08-09 NOTE — Telephone Encounter (Signed)
Caller: Meira/Patient; Patient Name: Joyce [name]; PCP: Darryll Capers; Best Callback Phone Number: [phone].  Pt was in the hospital 08/05/12 for similar problem.  Discharged saying it was a medication problem (not taking medication properly).  Pt states her symptoms came back on 08/08/12 and she called her cardiologist who states it is not her heart and she needs to see her primary doctor.  Pt feels her heart is fluttering and she is jittery.  All emergent symptoms of Irregular Heartbeat Protocol ruled out with exception to 'Persistent palpitations more than 1 hour with no other signs and symptoms'.  Home care advice given and appt scheduled at 1:45 with Dr Caryl Never today (08/09/12) (Primary MD was full)

## 2012-08-09 NOTE — Progress Notes (Signed)
Subjective:    Patient ID: Joyce [name], female    DOB: 11-Apr-1931, 77 y.o.   MRN: [id number]  HPI  Patient seen for followup.: This morning c/o of some palpitation without dizziness or syncope.  She's had some recent chest pressure. Last Thursday developed chest pressure and went to emergency room. Workup included chest x-ray, EKG, and lab work unremarkable. Ruled out for MI. She had nuclear stress test just couple weeks ago that was normal. She's not had any exertional chest pressure. No chest pain at this time. She's had some increased anxiousness. She attributes some of this to her husband with dementia. She feels anxious frequently.  Also recent bladder biopsy on Friday. Took 3 days of Macrobid-per urology. This morning woke up with burning of urination. No gross hematuria. No fever or chills.  Past Medical History  Diagnosis Date  . Unspecified essential hypertension   . Hyperlipidemia   . GERD (gastroesophageal reflux disease)   . Candidiasis of skin and nails   . Internal hemorrhoids without mention of complication   . Hypothyroidism   . IBS (irritable bowel syndrome)   . Coronary atherosclerosis of unspecified type of vessel, native or graft CARDIOLOGIST- DR [facility]    DENIES S & S  . Borderline glaucoma   . Adjustment disorder with anxiety STRESS CHEST DISCOMFORT    PT RECENTLY PUT HUSBAND IN NURSING HOME  . History of sleep apnea YRS AGO ON CPAP UNTIL LOST WT  . Pinched nerve LUMBAR    RESIDUAL LEFT FOOT NUMBNESS/ TINGLING  . Scoliosis   . Numbness and tingling of foot LEFT -- SECONDARY TO PINCHED LUMBAR NERVE  . Nocturia   . Stress incontinence   . Lesion of bladder    Past Surgical History  Procedure Date  . Vaginal hysterectomy 1968  . Cholecystectomy 1976  . Cataract extraction w/ intraocular lens  implant, bilateral   . Cardiac catheterization 01-10-2003   DR Chrissie Noa DOWNEY    PATENT CIRCUMFLEX STENT/ MODERATE CAD ELSEWHERE  . Coronary angioplasty with stent  placement 08-31-2001   DR Caily Rakers BRODIE    STENTING OF PROXIMAL CIRCUMFLEX (75% TO 0%)   . Cardiovascular stress test 07-27-2012    NORMAL NUCLEAR STUDY/ LVEF 81%  . Cystoscopy with biopsy 08/02/2012    Procedure: CYSTOSCOPY WITH BIOPSY;  Surgeon: Milford Cage, MD;  Location: [facility];  Service: Urology;  Laterality: N/A;    reports that she quit smoking about 29 years ago. Her smoking use included Cigarettes. She has a 30 pack-year smoking history. She has never used smokeless tobacco. She reports that she does not drink alcohol or use illicit drugs. family history includes Cancer in her mother and Heart disease in her mother. Allergies  Allergen Reactions  . Loratadine Other (See Comments)    Gi upset  . Adhesive (Tape) Other (See Comments)    Bruising and severe irritation  . Aspirin Other (See Comments)    Gi upset  . Levofloxacin Nausea And Vomiting  . Lumigan (Bimatoprost) Other (See Comments)    Severe burning of eyes  . Mobic (Meloxicam) Nausea And Vomiting and Other (See Comments)    hallucinations  . Penicillins Diarrhea and Nausea And Vomiting  . Statins Other (See Comments)    Severe muscle pain      Review of Systems  Constitutional: Negative for fever and chills.  Respiratory: Negative for shortness of breath.   Cardiovascular: Positive for palpitations. Negative for leg swelling.  Gastrointestinal: Negative for  abdominal pain.  Genitourinary: Positive for dysuria.  Neurological: Negative for syncope.       Objective:   Physical Exam  Constitutional: She is oriented to person, place, and time. She appears well-developed and well-nourished.  Neck: Neck supple. No thyromegaly present.  Cardiovascular: Normal rate and regular rhythm.   Pulmonary/Chest: Effort normal and breath sounds normal. No respiratory distress. She has no wheezes. She has no rales.  Musculoskeletal: She exhibits no edema.  Lymphadenopathy:    She has no cervical  adenopathy.  Neurological: She is alert and oriented to person, place, and time.          Assessment & Plan:  #1 dysuria. Urine dipstick suggests possible UTI. Urine culture sent. Cipro 500 mg twice a day pending culture results. She has a followup with urology this Thursday #2 intermittent chest pressure. Recent workup as above negative. Question anxiety related. She is on low-dose lorazepam to take as needed. She does not appear clinically depressed this time. Has very supportive family.

## 2012-08-09 NOTE — Patient Instructions (Signed)

## 2012-08-11 NOTE — Progress Notes (Signed)
Quick Note:  Left a message for pt to return call. ______ 

## 2012-08-12 NOTE — Progress Notes (Signed)
Quick Note:  Pt is aware of lab results  ______ 

## 2012-09-01 ENCOUNTER — Other Ambulatory Visit: Payer: Self-pay | Admitting: Internal Medicine

## 2012-09-19 NOTE — Progress Notes (Signed)
  Subjective:    Patient ID: Joyce [name], female    DOB: December 19, 1931, 78 y.o.   MRN: [id number]  HPI Patient is a 78 year old female followed for hypertension hyperlipidemia hypothyroidism and stress associated with the husband with progressive COPD and now developing progressive Alzheimer's.  He is in a nursing home and has been extremely stressful for her.  Her blood pressure however stable on her current medications gastroesophageal reflux is stable   Review of Systems  Constitutional: Negative.   HENT: Negative.   Eyes: Negative.   Respiratory: Negative for chest tightness.   Cardiovascular: Negative for chest pain.  Musculoskeletal: Positive for joint swelling.  Psychiatric/Behavioral: Positive for dysphoric mood.       Objective:   Physical Exam  Nursing note and vitals reviewed. Constitutional: She appears well-nourished.  HENT:  Head: Normocephalic and atraumatic.  Cardiovascular: Normal rate and regular rhythm.   Murmur heard. Pulmonary/Chest: Effort normal and breath sounds normal.  Abdominal: Soft. Bowel sounds are normal.  Musculoskeletal: She exhibits tenderness.          Assessment & Plan:  Stable hypertension on current medications.  Refill blood pressure medicines.  Stable hyperlipidemia and stable hypothyroidism.  Discussed husband's condition and stressed that it has on her life which is resulted in nonintentional  weight loss

## 2012-10-05 ENCOUNTER — Encounter: Payer: Self-pay | Admitting: Internal Medicine

## 2012-10-05 ENCOUNTER — Ambulatory Visit (INDEPENDENT_AMBULATORY_CARE_PROVIDER_SITE_OTHER): Payer: Medicare Other | Admitting: Internal Medicine

## 2012-10-05 VITALS — BP 140/60 | HR 64 | Temp 98.6°F | Resp 16 | Ht 59.0 in | Wt 134.0 lb

## 2012-10-05 DIAGNOSIS — K589 Irritable bowel syndrome without diarrhea: Secondary | ICD-10-CM

## 2012-10-05 DIAGNOSIS — E039 Hypothyroidism, unspecified: Secondary | ICD-10-CM

## 2012-10-05 DIAGNOSIS — F329 Major depressive disorder, single episode, unspecified: Secondary | ICD-10-CM

## 2012-10-05 DIAGNOSIS — I10 Essential (primary) hypertension: Secondary | ICD-10-CM

## 2012-10-05 DIAGNOSIS — E785 Hyperlipidemia, unspecified: Secondary | ICD-10-CM

## 2012-10-05 DIAGNOSIS — Z23 Encounter for immunization: Secondary | ICD-10-CM

## 2012-10-05 DIAGNOSIS — E876 Hypokalemia: Secondary | ICD-10-CM

## 2012-10-05 DIAGNOSIS — T887XXA Unspecified adverse effect of drug or medicament, initial encounter: Secondary | ICD-10-CM

## 2012-10-05 LAB — CBC WITH DIFFERENTIAL/PLATELET
Basophils Absolute: 0 10*3/uL (ref 0.0–0.1)
Basophils Relative: 0.5 % (ref 0.0–3.0)
Eosinophils Absolute: 0.1 10*3/uL (ref 0.0–0.7)
Eosinophils Relative: 1.4 % (ref 0.0–5.0)
HCT: 39.1 % (ref 36.0–46.0)
Hemoglobin: 12.9 g/dL (ref 12.0–15.0)
Lymphocytes Relative: 36.5 % (ref 12.0–46.0)
Lymphs Abs: 2 10*3/uL (ref 0.7–4.0)
MCHC: 32.9 g/dL (ref 30.0–36.0)
MCV: 91.5 fl (ref 78.0–100.0)
Monocytes Absolute: 0.6 10*3/uL (ref 0.1–1.0)
Monocytes Relative: 10.5 % (ref 3.0–12.0)
Neutro Abs: 2.8 10*3/uL (ref 1.4–7.7)
Neutrophils Relative %: 51.1 % (ref 43.0–77.0)
Platelets: 192 10*3/uL (ref 150.0–400.0)
RBC: 4.28 Mil/uL (ref 3.87–5.11)
RDW: 12.8 % (ref 11.5–14.6)
WBC: 5.4 10*3/uL (ref 4.5–10.5)

## 2012-10-05 LAB — LIPID PANEL
Cholesterol: 169 mg/dL (ref 0–200)
HDL: 50.2 mg/dL (ref >39.00)
LDL Cholesterol: 104 mg/dL — ABNORMAL HIGH (ref 0–99)
Total CHOL/HDL Ratio: 3
Triglycerides: 76 mg/dL (ref 0.0–149.0)
VLDL: 15.2 mg/dL (ref 0.0–40.0)

## 2012-10-05 LAB — BASIC METABOLIC PANEL
BUN: 9 mg/dL (ref 6–23)
CO2: 31 mEq/L (ref 19–32)
Calcium: 8.7 mg/dL (ref 8.4–10.5)
Chloride: 100 mEq/L (ref 96–112)
Creatinine, Ser: 0.5 mg/dL (ref 0.4–1.2)
GFR: 129.24 mL/min (ref >60.00)
Glucose, Bld: 98 mg/dL (ref 70–99)
Potassium: 3.9 mEq/L (ref 3.5–5.1)
Sodium: 137 mEq/L (ref 135–145)

## 2012-10-05 LAB — HEPATIC FUNCTION PANEL
AST: 26 U/L (ref 0–37)
Albumin: 3.4 g/dL — ABNORMAL LOW (ref 3.5–5.2)
Alkaline Phosphatase: 43 U/L (ref 39–117)
Bilirubin, Direct: 0.2 mg/dL (ref 0.0–0.3)

## 2012-10-05 LAB — T3, FREE: T3, Free: 2.1 pg/mL — ABNORMAL LOW (ref 2.3–4.2)

## 2012-10-05 NOTE — Progress Notes (Signed)
Subjective:    Patient ID: Joyce [name], female    DOB: [birth year], 78 y.o.   MRN: [id number]  HPI This is a 78 year old female followed for hypertension hyperlipidemia gastroesophageal reflux and the stress related to caring for husband with progressive Alzheimer's disease She was taking her blood pressure medications wrong and developed a hypertensive crisis. Still having increased panic and anxiety The urologist took the diuretic out of her medications.  In the hospital the synthroid to 137mg   Review of Systems  Constitutional: Positive for fatigue. Negative for activity change and appetite change.  HENT: Negative for ear pain, congestion, neck pain, postnasal drip and sinus pressure.   Eyes: Negative for redness and visual disturbance.  Respiratory: Positive for shortness of breath. Negative for cough and wheezing.   Cardiovascular: Positive for palpitations and leg swelling.  Gastrointestinal: Negative for abdominal pain and abdominal distention.  Genitourinary: Negative for dysuria, frequency and menstrual problem.  Musculoskeletal: Positive for myalgias and joint swelling. Negative for arthralgias.  Skin: Negative for rash and wound.  Neurological: Positive for weakness. Negative for dizziness and headaches.  Hematological: Negative for adenopathy. Does not bruise/bleed easily.  Psychiatric/Behavioral: Negative for disturbed wake/sleep cycle and decreased concentration.   Past Medical History  Diagnosis Date  . Unspecified essential hypertension   . Hyperlipidemia   . GERD (gastroesophageal reflux disease)   . Candidiasis of skin and nails   . Internal hemorrhoids without mention of complication   . Hypothyroidism   . IBS (irritable bowel syndrome)   . Coronary atherosclerosis of unspecified type of vessel, native or graft CARDIOLOGIST- DR [facility]    DENIES S & S  . Borderline glaucoma   . Adjustment disorder with anxiety STRESS CHEST DISCOMFORT    PT RECENTLY PUT  HUSBAND IN NURSING HOME  . History of sleep apnea YRS AGO ON CPAP UNTIL LOST WT  . Pinched nerve LUMBAR    RESIDUAL LEFT FOOT NUMBNESS/ TINGLING  . Scoliosis   . Numbness and tingling of foot LEFT -- SECONDARY TO PINCHED LUMBAR NERVE  . Nocturia   . Stress incontinence   . Lesion of bladder     History   Social History  . Marital Status: Married    Spouse Name: N/A    Number of Children: N/A  . Years of Education: N/A   Occupational History  . retired    Social History Main Topics  . Smoking status: Former Smoker -- 1.5 packs/day for 20 years    Types: Cigarettes    Quit date: 07/31/1983  . Smokeless tobacco: Never Used  . Alcohol Use: No  . Drug Use: No  . Sexually Active: Not on file   Other Topics Concern  . Not on file   Social History Narrative  . No narrative on file    Past Surgical History  Procedure Date  . Vaginal hysterectomy 1968  . Cholecystectomy 1976  . Cataract extraction w/ intraocular lens  implant, bilateral   . Cardiac catheterization 01-10-2003   DR Chrissie Noa DOWNEY    PATENT CIRCUMFLEX STENT/ MODERATE CAD ELSEWHERE  . Coronary angioplasty with stent placement 08-31-2001   DR BRUCE BRODIE    STENTING OF PROXIMAL CIRCUMFLEX (75% TO 0%)   . Cardiovascular stress test 07-27-2012    NORMAL NUCLEAR STUDY/ LVEF 81%  . Cystoscopy with biopsy 08/02/2012    Procedure: CYSTOSCOPY WITH BIOPSY;  Surgeon: Milford Cage, MD;  Location: [facility];  Service: Urology;  Laterality: N/A;    Family  History  Problem Relation Age of Onset  . Heart disease Mother   . Cancer Mother     Allergies  Allergen Reactions  . Loratadine Other (See Comments)    Gi upset  . Adhesive (Tape) Other (See Comments)    Bruising and severe irritation  . Aspirin Other (See Comments)    Gi upset  . Levofloxacin Nausea And Vomiting  . Lumigan (Bimatoprost) Other (See Comments)    Severe burning of eyes  . Mobic (Meloxicam) Nausea And Vomiting and  Other (See Comments)    hallucinations  . Penicillins Diarrhea and Nausea And Vomiting  . Statins Other (See Comments)    Severe muscle pain    Current Outpatient Prescriptions on File Prior to Visit  Medication Sig Dispense Refill  . atenolol (TENORMIN) 25 MG tablet Take 1 tablet (25 mg total) by mouth 2 (two) times daily.  60 tablet  11  . Biotin (APPEAREX) 2.5 MG TABS Take 2.5 mg by mouth daily.      Marland Kitchen esomeprazole (NEXIUM) 40 MG capsule Take 40 mg by mouth every other day.       . estrogens, conjugated, (PREMARIN) 0.625 MG tablet Take 0.625 mg by mouth 3 (three) times a week.       . fish oil-omega-3 fatty acids 1000 MG capsule Take 1 g by mouth 3 (three) times daily.      . hyoscyamine (LEVSIN, ANASPAZ) 0.125 MG tablet Take 0.125 mg by mouth every 4 (four) hours as needed.      . Lactobacillus (ACIDOPHILUS) 10 MG CAPS Take 2 capsules by mouth daily.       Marland Kitchen LORazepam (ATIVAN) 0.5 MG tablet TAKE ONE TABLET BY MOUTH EVERY 8 HOURS AS NEEDED FOR ANXIETY  60 tablet  3  . nitroGLYCERIN (NITROSTAT) 0.4 MG SL tablet Place 1 tablet (0.4 mg total) under the tongue every 5 (five) minutes as needed for chest pain.  25 tablet  3  . oxybutynin (DITROPAN) 5 MG tablet Take 5 mg by mouth 2 (two) times daily.       . Probiotic Product (ALIGN PO) Take 1 capsule by mouth daily.      Marland Kitchen pyridOXINE (B-6) 50 MG tablet Take 50 mg by mouth daily.      . vitamin B-12 (CYANOCOBALAMIN) 500 MCG tablet Take 500 mcg by mouth daily.       . vitamin E 400 UNIT capsule Take 400 Units by mouth daily.      Marland Kitchen amLODipine (NORVASC) 5 MG tablet Take 1 tablet (5 mg total) by mouth daily.  90 tablet  3  . levothyroxine (SYNTHROID, LEVOTHROID) 150 MCG tablet Take 1 tablet (150 mcg total) by mouth daily.  90 tablet  3    BP 140/60  Pulse 64  Temp 98.6 F (37 C)  Resp 16  Ht 4\' 11"  (1.499 m)  Wt 134 lb (60.782 kg)  BMI 27.06 kg/m2        Objective:   Physical Exam  Vitals reviewed. Constitutional: She is oriented  to person, place, and time. No distress.  HENT:  Head: Normocephalic and atraumatic.  Eyes: Conjunctivae normal and EOM are normal. Pupils are equal, round, and reactive to light.  Neck: Normal range of motion. Neck supple. No JVD present. No tracheal deviation present. No thyromegaly present.  Cardiovascular: Normal rate, regular rhythm and intact distal pulses.   Murmur heard. Pulmonary/Chest: Effort normal and breath sounds normal. She has no wheezes. She exhibits no tenderness.  Abdominal:  Soft. Bowel sounds are normal.  Musculoskeletal: She exhibits tenderness. She exhibits no edema.  Lymphadenopathy:    She has no cervical adenopathy.  Neurological: She is alert and oriented to person, place, and time. She has normal reflexes. No cranial nerve deficit.  Skin: Skin is warm and dry. She is not diaphoretic.  Psychiatric: She has a normal mood and affect. Her behavior is normal.          Assessment & Plan:  Stable blood pressure on medications with out the diuretic Stress over her husband has resulted in some care and compliance issues for her Anxiety and depression Dicussed need for counseling Medication change today

## 2012-10-06 ENCOUNTER — Ambulatory Visit: Payer: Medicare Other | Admitting: Internal Medicine

## 2012-10-08 ENCOUNTER — Telehealth: Payer: Self-pay | Admitting: Internal Medicine

## 2012-10-08 NOTE — Telephone Encounter (Signed)
Pt called req to get lab results. Pt needs to know if thyroid med needs to be changed based on the results.

## 2012-10-11 ENCOUNTER — Other Ambulatory Visit: Payer: Self-pay | Admitting: *Deleted

## 2012-10-11 MED ORDER — LEVOTHYROXINE SODIUM 150 MCG PO TABS: 150.0000 ug | ORAL_TABLET | Freq: Every day | ORAL | Status: DC

## 2012-10-11 NOTE — Telephone Encounter (Signed)
Pt informed and med sent to [facility] on [address]

## 2012-10-11 NOTE — Telephone Encounter (Signed)
Increase the synthroid to 150 mcg

## 2012-11-17 ENCOUNTER — Encounter: Payer: Self-pay | Admitting: Cardiovascular Disease

## 2012-11-17 ENCOUNTER — Ambulatory Visit (INDEPENDENT_AMBULATORY_CARE_PROVIDER_SITE_OTHER): Payer: Medicare Other | Admitting: Cardiovascular Disease

## 2012-11-17 VITALS — BP 164/68 | HR 53 | Ht 59.0 in | Wt 134.0 lb

## 2012-11-17 DIAGNOSIS — I2510 Atherosclerotic heart disease of native coronary artery without angina pectoris: Secondary | ICD-10-CM

## 2012-11-17 DIAGNOSIS — I10 Essential (primary) hypertension: Secondary | ICD-10-CM

## 2012-11-17 MED ORDER — AMLODIPINE BESYLATE 5 MG PO TABS: 5.0000 mg | ORAL_TABLET | Freq: Every day | ORAL | Status: DC

## 2012-11-17 NOTE — Patient Instructions (Signed)
Your physician has recommended you make the following change in your medication: STOP Welchol, START Amlodipine 5mg  take one by mouth daily  Your physician has requested that you regularly monitor and record your blood pressure readings at home. Please use the same machine at the same time of day to check your readings and record them to bring to your follow-up visit. We would like your BP to be consistently below 140/90.   Your physician recommends that you schedule a follow-up appointment in: 3 MONTHS with Dr Excell Seltzer.

## 2012-11-17 NOTE — Progress Notes (Signed)
HPI:  78 year old woman presenting for followup evaluation. The patient has coronary artery disease and underwent stenting of the left circumflex many years ago. Her last stress test was a Myoview study from 07/27/2012. This showed normal perfusion with an ejection fraction of 81%. Last lipids were checked October 15 of this year showing a cholesterol of 169, triglycerides 76, HDL 50, and LDL 104.  She continues to be under a great deal of stress related to her husband's dementia. She was hospitalized recently with hypertensive urgency and she notes that she was taking her atenolol once a day rather than twice a day at that time. That is her only antihypertensive drug. Her blood pressure has been running a bit high. She's had no chest pain or shortness of breath. She continues to have some problems with her left leg related to a pinched nerve.  Outpatient Encounter Prescriptions as of 11/17/2012  Medication Sig Dispense Refill  . atenolol (TENORMIN) 25 MG tablet Take 1 tablet (25 mg total) by mouth 2 (two) times daily.  60 tablet  11  . Biotin (APPEAREX) 2.5 MG TABS Take 2.5 mg by mouth daily.      . Colesevelam HCl (WELCHOL) 3.75 G PACK Take 1 each by mouth daily.      Marland Kitchen esomeprazole (NEXIUM) 40 MG capsule Take 40 mg by mouth every other day.       . estrogens, conjugated, (PREMARIN) 0.625 MG tablet Take 0.625 mg by mouth 3 (three) times a week.       . fish oil-omega-3 fatty acids 1000 MG capsule Take 1 g by mouth 3 (three) times daily.      . hyoscyamine (LEVSIN, ANASPAZ) 0.125 MG tablet Take 0.125 mg by mouth every 4 (four) hours as needed.      . Lactobacillus (ACIDOPHILUS) 10 MG CAPS Take 2 capsules by mouth daily.       Marland Kitchen levothyroxine (SYNTHROID, LEVOTHROID) 150 MCG tablet Take 1 tablet (150 mcg total) by mouth daily.  90 tablet  3  . LORazepam (ATIVAN) 0.5 MG tablet TAKE ONE TABLET BY MOUTH EVERY 8 HOURS AS NEEDED FOR ANXIETY  60 tablet  3  . niacinamide 100 MG tablet Take 1000 units  twice daily      . nitroGLYCERIN (NITROSTAT) 0.4 MG SL tablet Place 1 tablet (0.4 mg total) under the tongue every 5 (five) minutes as needed for chest pain.  25 tablet  3  . oxybutynin (DITROPAN) 5 MG tablet Take 5 mg by mouth 2 (two) times daily.       . Probiotic Product (ALIGN PO) Take 1 capsule by mouth daily.      Marland Kitchen pyridOXINE (B-6) 50 MG tablet Take 50 mg by mouth daily.      . vitamin B-12 (CYANOCOBALAMIN) 500 MCG tablet Take 500 mcg by mouth daily.       . vitamin E 400 UNIT capsule Take 400 Units by mouth daily.      . [DISCONTINUED] levothyroxine (SYNTHROID, LEVOTHROID) 137 MCG tablet Take 1 tablet (137 mcg total) by mouth every morning.  30 tablet  2  . [DISCONTINUED] phenazopyridine (PYRIDIUM) 100 MG tablet Take 100 mg by mouth every 8 (eight) hours as needed.        Allergies  Allergen Reactions  . Loratadine Other (See Comments)    Gi upset  . Adhesive (Tape) Other (See Comments)    Bruising and severe irritation  . Aspirin Other (See Comments)    Gi upset  .  Levofloxacin Nausea And Vomiting  . Lumigan (Bimatoprost) Other (See Comments)    Severe burning of eyes  . Mobic (Meloxicam) Nausea And Vomiting and Other (See Comments)    hallucinations  . Penicillins Diarrhea and Nausea And Vomiting  . Statins Other (See Comments)    Severe muscle pain    Past Medical History  Diagnosis Date  . Unspecified essential hypertension   . Hyperlipidemia   . GERD (gastroesophageal reflux disease)   . Candidiasis of skin and nails   . Internal hemorrhoids without mention of complication   . Hypothyroidism   . IBS (irritable bowel syndrome)   . Coronary atherosclerosis of unspecified type of vessel, native or graft CARDIOLOGIST- DR [facility]    DENIES S & S  . Borderline glaucoma   . Adjustment disorder with anxiety STRESS CHEST DISCOMFORT    PT RECENTLY PUT HUSBAND IN NURSING HOME  . History of sleep apnea YRS AGO ON CPAP UNTIL LOST WT  . Pinched nerve LUMBAR    RESIDUAL  LEFT FOOT NUMBNESS/ TINGLING  . Scoliosis   . Numbness and tingling of foot LEFT -- SECONDARY TO PINCHED LUMBAR NERVE  . Nocturia   . Stress incontinence   . Lesion of bladder     ROS: Negative except as per HPI  BP 164/68  Pulse 53  Ht 4\' 11"  (1.499 m)  Wt 60.782 kg (134 lb)  BMI 27.06 kg/m2  SpO2 96%  PHYSICAL EXAM: Pt is alert and oriented, NAD HEENT: normal Neck: JVP - normal, carotids 2+= without bruits Lungs: CTA bilaterally CV: RRR without murmur or gallop Abd: soft, NT, Positive BS, no hepatomegaly Ext: no C/C/E, distal pulses intact and equal Skin: warm/dry no rash  ASSESSMENT AND PLAN: 1. Coronary artery disease, native vessel. Recent stress test was negative. She's having no anginal symptoms. She is aspirin allergic. We will continue her same program.  2. Hypertension with suboptimal control. I have recommended at amlodipine 5 mg daily. She will continue on atenolol 25 mg twice daily.  3. Hyperlipidemia. Her lipids were reviewed. She would like to stop taking WelChol if possible. I think this is reasonable as there has not been proven reduction in cardiovascular end points. She is statin intolerant.  Tonny Bollman 11/17/2012 11:52 AM

## 2012-12-20 ENCOUNTER — Other Ambulatory Visit: Payer: Self-pay | Admitting: Internal Medicine

## 2012-12-20 DIAGNOSIS — Z1231 Encounter for screening mammogram for malignant neoplasm of breast: Secondary | ICD-10-CM

## 2013-01-19 ENCOUNTER — Ambulatory Visit: Payer: Medicare Other

## 2013-02-02 ENCOUNTER — Ambulatory Visit: Payer: Medicare Other

## 2013-02-17 ENCOUNTER — Telehealth: Payer: Self-pay | Admitting: Internal Medicine

## 2013-02-17 NOTE — Telephone Encounter (Signed)
Pt is a Jenkins pt, but cannot see him due to limited availabity. Pt requested Dr Fabian Sharp, her daughter sees her and thinks highly of her. I made an appt for pt, but after continuing to talk w/ her, she said her heart palpitations are probably anxiety.  There are 2 "Same day" appts after her appt.that day.  Is it ok to take on of those to make a 30 min?

## 2013-02-23 ENCOUNTER — Ambulatory Visit
Admission: RE | Admit: 2013-02-23 | Discharge: 2013-02-23 | Disposition: A | Discharge status: Home / Self Care | Payer: Medicare Other | Source: Ambulatory Visit | Attending: Internal Medicine | Admitting: Internal Medicine

## 2013-02-25 ENCOUNTER — Ambulatory Visit: Payer: Medicare Other | Admitting: Cardiovascular Disease

## 2013-02-25 ENCOUNTER — Ambulatory Visit: Payer: Medicare Other | Admitting: Internal Medicine

## 2013-03-01 ENCOUNTER — Ambulatory Visit (INDEPENDENT_AMBULATORY_CARE_PROVIDER_SITE_OTHER): Payer: Medicare Other | Admitting: Internal Medicine

## 2013-03-01 ENCOUNTER — Encounter: Payer: Self-pay | Admitting: Internal Medicine

## 2013-03-01 VITALS — BP 148/70 | HR 54 | Temp 98.2°F | Wt 135.0 lb

## 2013-03-01 DIAGNOSIS — R634 Abnormal weight loss: Secondary | ICD-10-CM

## 2013-03-01 DIAGNOSIS — I2510 Atherosclerotic heart disease of native coronary artery without angina pectoris: Secondary | ICD-10-CM

## 2013-03-01 DIAGNOSIS — Z9223 Personal history of estrogen therapy: Secondary | ICD-10-CM

## 2013-03-01 DIAGNOSIS — E039 Hypothyroidism, unspecified: Secondary | ICD-10-CM

## 2013-03-01 DIAGNOSIS — F439 Reaction to severe stress, unspecified: Secondary | ICD-10-CM

## 2013-03-01 DIAGNOSIS — F4322 Adjustment disorder with anxiety: Secondary | ICD-10-CM

## 2013-03-01 DIAGNOSIS — I10 Essential (primary) hypertension: Secondary | ICD-10-CM

## 2013-03-01 DIAGNOSIS — R002 Palpitations: Secondary | ICD-10-CM

## 2013-03-01 DIAGNOSIS — R079 Chest pain, unspecified: Secondary | ICD-10-CM

## 2013-03-01 LAB — SEDIMENTATION RATE: Sed Rate: 14 mm/hr (ref 0–22)

## 2013-03-01 LAB — CBC WITH DIFFERENTIAL/PLATELET
Eosinophils Relative: 1.5 % (ref 0.0–5.0)
HCT: 41.9 % (ref 36.0–46.0)
Hemoglobin: 14.1 g/dL (ref 12.0–15.0)
Lymphs Abs: 1.7 10*3/uL (ref 0.7–4.0)
Monocytes Relative: 10.3 % (ref 3.0–12.0)
Platelets: 198 10*3/uL (ref 150.0–400.0)
WBC: 4.8 10*3/uL (ref 4.5–10.5)

## 2013-03-01 LAB — BASIC METABOLIC PANEL
BUN: 11 mg/dL (ref 6–23)
CO2: 30 mEq/L (ref 19–32)
Calcium: 9.5 mg/dL (ref 8.4–10.5)
Chloride: 101 mEq/L (ref 96–112)
Creatinine, Ser: 0.6 mg/dL (ref 0.4–1.2)
GFR: 98.41 mL/min (ref >60.00)
Glucose, Bld: 99 mg/dL (ref 70–99)
Potassium: 5.1 mEq/L (ref 3.5–5.1)
Sodium: 138 mEq/L (ref 135–145)

## 2013-03-01 LAB — HEPATIC FUNCTION PANEL
Alkaline Phosphatase: 49 U/L (ref 39–117)
Bilirubin, Direct: 0.3 mg/dL (ref 0.0–0.3)
Total Protein: 7.2 g/dL (ref 6.0–8.3)

## 2013-03-01 LAB — TSH: TSH: 0.05 u[IU]/mL — ABNORMAL LOW (ref 0.35–5.50)

## 2013-03-01 NOTE — Patient Instructions (Addendum)
Will notify you  of labs when available. The weight loss may be from stress but will review your record about this. At this time ok to take the lorazepam at night and if getting a  Panic attack. COnsider getting cardiology involved or event  monitor if not done recently  To make sure your heart rhythm is ok  With these spells.  Decide on follow  Up depending on labs    Blood pressure is ok today .

## 2013-03-01 NOTE — Telephone Encounter (Signed)
Seen in office today  
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
## 2013-03-09 ENCOUNTER — Other Ambulatory Visit: Payer: Self-pay | Admitting: Family Medicine

## 2013-03-09 DIAGNOSIS — E039 Hypothyroidism, unspecified: Secondary | ICD-10-CM

## 2013-03-09 MED ORDER — SYNTHROID 125 MCG PO TABS: 125.0000 ug | ORAL_TABLET | Freq: Every day | ORAL | Status: DC

## 2013-03-29 ENCOUNTER — Encounter: Payer: Self-pay | Admitting: Gastroenterology

## 2013-04-01 ENCOUNTER — Encounter: Payer: Self-pay | Admitting: Gastroenterology

## 2013-04-13 ENCOUNTER — Encounter: Payer: Self-pay | Admitting: Cardiovascular Disease

## 2013-04-13 ENCOUNTER — Ambulatory Visit (INDEPENDENT_AMBULATORY_CARE_PROVIDER_SITE_OTHER): Payer: Medicare Other | Admitting: Cardiovascular Disease

## 2013-04-13 VITALS — BP 140/60 | HR 55 | Ht 59.0 in | Wt 136.0 lb

## 2013-04-13 DIAGNOSIS — I2510 Atherosclerotic heart disease of native coronary artery without angina pectoris: Secondary | ICD-10-CM

## 2013-04-13 DIAGNOSIS — I10 Essential (primary) hypertension: Secondary | ICD-10-CM

## 2013-04-13 MED ORDER — AMLODIPINE BESYLATE 2.5 MG PO TABS: 2.5000 mg | ORAL_TABLET | Freq: Every day | ORAL | Status: DC
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
## 2013-04-13 NOTE — Patient Instructions (Addendum)
Your physician has recommended you make the following change in your medication: DECREASE Amlodipine 2.5mg  take one by mouth daily  Your physician wants you to follow-up in: 6 MONTHS with Dr Excell Seltzer.  You will receive a reminder letter in the mail two months in advance. If you don't receive a letter, please call our office to schedule the follow-up appointment.

## 2013-04-28 ENCOUNTER — Other Ambulatory Visit (INDEPENDENT_AMBULATORY_CARE_PROVIDER_SITE_OTHER): Payer: Medicare Other

## 2013-04-28 ENCOUNTER — Other Ambulatory Visit: Payer: Medicare Other

## 2013-04-28 DIAGNOSIS — E039 Hypothyroidism, unspecified: Secondary | ICD-10-CM

## 2013-04-29 ENCOUNTER — Other Ambulatory Visit: Payer: Self-pay | Admitting: *Deleted

## 2013-04-29 MED ORDER — ESOMEPRAZOLE MAGNESIUM 40 MG PO CPDR: 40.0000 mg | DELAYED_RELEASE_CAPSULE | ORAL | Status: DC

## 2013-05-04 ENCOUNTER — Other Ambulatory Visit: Payer: Self-pay | Admitting: Internal Medicine

## 2013-05-09 ENCOUNTER — Telehealth: Payer: Self-pay | Admitting: Family Medicine

## 2013-05-09 NOTE — Telephone Encounter (Signed)
Per dr Lovell Sheehan her t3 is always ok- low due to taking it in the am- dr Lovell Sheehan doesn not want to change and Left message on machine for pt

## 2013-05-09 NOTE — Telephone Encounter (Signed)
Joyce [name], here are the results from the last TSH for this patient.    Tell her that blood tests shows that she still may be taking Too much synthroid on her dose. Am going to Send a copy of this to dr Lovell Sheehan and let him decide on Adjusting your dose of medication.   Please ask Dr. Shela Commons what he wants to do about her medication.  Thanks!!

## 2013-05-23 ENCOUNTER — Telehealth: Payer: Self-pay | Admitting: Internal Medicine

## 2013-05-23 NOTE — Telephone Encounter (Signed)
Patient Information:  Caller Name: [place]  Phone: [phone]  Patient: Joyce [name], Joyce [name]  Gender: Female  DOB: [birth year]  Age: 79 Years  PCP: Berniece Andreas (Family Practice)  Office Follow Up:  Does the office need to follow up with this patient?: No  Instructions For The Office: N/A   Symptoms  Reason For Call & Symptoms: Kamaiya states she received a bite on her finger from an insect at 0945. DIs not see insect. States area turned red and sore but had no swelling. States area is pinpoint sized and flat. Finger  somewhat stiff . States redness ansd soreness has improved at 1500. Rates finger pain a 4 or 5 on 1/10 pt scale.  Reviewed Health History In EMR: Yes  Reviewed Medications In EMR: Yes  Reviewed Allergies In EMR: Yes  Reviewed Surgeries / Procedures: Yes  Date of Onset of Symptoms: 05/23/2013  Treatments Tried: Polysporin  Treatments Tried Worked: No  Guideline(s) Used:  IT sales professional  Disposition Per Guideline:   Home Care  Reason For Disposition Reached:   Painful insect bite  Advice Given:  Local Treatment - Painful Insect Bites  Rub the bite for 15 to 20 minutes with a cotton ball soaked in a meat tenderizer solution. This will usually relieve the pain (Caution: don't use near the eye).  If not available, use a baking soda solution on a cotton ball.  If neither is available, apply an ice cube for 20 minutes.  Pain Medicines:  For pain relief, you can take either acetaminophen, ibuprofen, or naproxen.  They are over-the-counter (OTC) pain drugs. You can buy them at the drugstore.  Antibiotic Ointment:   Cover the scab with a Band-Aid to prevent scratching and spread.  Repeat washing the sore, the antibiotic ointment, and the Band-Aid 4 times per day until healed.  Expected Course:  Most insect bites are itchy and puffy for several days.  Insect bites of the upper face can cause marked swelling around the eye, but this is harmless.  Any pinkness or redness usually  lasts 3 days.  Call Back If:  Severe pain lasts over 2 hours after pain medicine  Bite looks infected (redness, red streaks, increased tenderness)  Redness getting larger and more than 48 hours after the bite  Infected scab doesn't look better after 48 hours of antibiotic ointment  You become worse.  Patient Will Follow Care Advice:  YES

## 2013-06-16 ENCOUNTER — Encounter: Payer: Self-pay | Admitting: Family Medicine

## 2013-06-16 ENCOUNTER — Telehealth: Payer: Self-pay | Admitting: Internal Medicine

## 2013-06-16 ENCOUNTER — Ambulatory Visit (INDEPENDENT_AMBULATORY_CARE_PROVIDER_SITE_OTHER): Payer: Medicare Other | Admitting: Family Medicine

## 2013-06-16 VITALS — BP 120/62 | HR 64 | Temp 98.5°F | Resp 14 | Wt 137.0 lb

## 2013-06-16 DIAGNOSIS — J019 Acute sinusitis, unspecified: Secondary | ICD-10-CM

## 2013-06-16 MED ORDER — AZITHROMYCIN 250 MG PO TABS: ORAL_TABLET | ORAL | Status: AC
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
## 2013-06-16 NOTE — Patient Instructions (Addendum)

## 2013-06-16 NOTE — Telephone Encounter (Signed)
Patient Information:  Caller Name: [place]  Phone: [phone]  Patient: Joyce [name], Joyce [name]  Gender: Female  DOB: 28-Jan-1931  Age: 79 Years  PCP: Darryll Capers (Adults only)  Office Follow Up:  Does the office need to follow up with this patient?: No  Instructions For The Office: N/A   Symptoms  Reason For Call & Symptoms: 06/02/13 cough 06/09/13 cough continues, sore throat, sinus pain and pressure across forehead, rates 5/10, afebrile.  Using Netipot BID, yellow, green, brown drainage.  Reviewed Health History In EMR: Yes  Reviewed Medications In EMR: Yes  Reviewed Allergies In EMR: Yes  Reviewed Surgeries / Procedures: Yes  Date of Onset of Symptoms: 06/02/2013  Treatments Tried: Netipot  Treatments Tried Worked: No  Guideline(s) Used:  Sinus Pain and Congestion  Disposition Per Guideline:   See Today or Tomorrow in Office  Reason For Disposition Reached:   Lots of coughing  Advice Given:  For a Stuffy Nose - Use Nasal Washes:  How it Helps: The salt water rinses out excess mucus, washes out any irritants (dust, allergens) that might be present, and moistens the nasal cavity.  Methods: There are several ways to perform nasal irrigation. You can use a saline nasal spray bottle (available over-the-counter), a rubber ear syringe, a medical syringe without the needle, or a Neti Pot.  Hydration:  Drink plenty of liquids (6-8 glasses of water daily). If the air in your home is dry, use a cool mist humidifier  Call Back If:   Fever lasts longer than 3 days  You become worse.  Patient Will Follow Care Advice:  YES  Appointment Scheduled:  06/16/2013 16:00:00 Appointment Scheduled Provider:  Evelena Peat [facility])

## 2013-08-24 ENCOUNTER — Other Ambulatory Visit: Payer: Self-pay | Admitting: Internal Medicine

## 2013-09-26 ENCOUNTER — Telehealth: Payer: Self-pay | Admitting: Internal Medicine

## 2013-09-26 MED ORDER — SYNTHROID 125 MCG PO TABS: ORAL_TABLET | ORAL | Status: DC

## 2013-09-26 NOTE — Telephone Encounter (Signed)
Pt is calling to request a 3 month refill of her SYNTHROID 125 MCG tablet. She would like it sent to [facility] on [address]. Please assist.

## 2013-09-26 NOTE — Telephone Encounter (Signed)
done

## 2013-09-29 ENCOUNTER — Encounter: Payer: Self-pay | Admitting: Internal Medicine

## 2013-09-29 ENCOUNTER — Ambulatory Visit (INDEPENDENT_AMBULATORY_CARE_PROVIDER_SITE_OTHER): Payer: Medicare Other | Admitting: Internal Medicine

## 2013-09-29 ENCOUNTER — Telehealth: Payer: Self-pay | Admitting: Internal Medicine

## 2013-09-29 VITALS — BP 142/70 | HR 72 | Temp 98.2°F | Resp 16 | Ht 59.0 in | Wt 134.0 lb

## 2013-09-29 DIAGNOSIS — J329 Chronic sinusitis, unspecified: Secondary | ICD-10-CM

## 2013-09-29 DIAGNOSIS — I2510 Atherosclerotic heart disease of native coronary artery without angina pectoris: Secondary | ICD-10-CM

## 2013-09-29 HISTORY — DX: Chronic sinusitis, unspecified: J32.9

## 2013-09-29 MED ORDER — DOXYCYCLINE HYCLATE 100 MG PO TABS: 100.0000 mg | ORAL_TABLET | Freq: Two times a day (BID) | ORAL | Status: DC

## 2013-09-29 MED ORDER — NITROGLYCERIN 0.4 MG SL SUBL: 0.4000 mg | SUBLINGUAL_TABLET | SUBLINGUAL | Status: DC | PRN

## 2013-09-29 NOTE — Progress Notes (Signed)
Subjective:    Patient ID: Joyce [name], female    DOB: 03-14-1931, 79 y.o.   MRN: [id number]  HPI  79 year old white female with history of hypertension and remote tobacco use complains of sinus congestion and cough for one week. She reports discomfort in her upper teeth. She had tooth pulled in her lower left jaw one week ago. She denies any fever. She denies any wheezing or chest tightness. She has mild shortness of breath.  Her symptoms started with nasal congestion. She reports she visits her husband who lives in a nursing home. Several nursing home residents have similar symptoms.  Patient has history of coronary artery disease. She denies any exertional chest pain. Her current prescription for nitroglycerin has expired. She requests refill.  Review of Systems   negative for fever or chills, negative for chest pain  Past Medical History  Diagnosis Date  . Unspecified essential hypertension   . Hyperlipidemia   . GERD (gastroesophageal reflux disease)   . Candidiasis of skin and nails   . Internal hemorrhoids without mention of complication   . Hypothyroidism   . IBS (irritable bowel syndrome)   . Coronary atherosclerosis of unspecified type of vessel, native or graft CARDIOLOGIST- DR [facility]    DENIES S & S  . Borderline glaucoma   . Adjustment disorder with anxiety STRESS CHEST DISCOMFORT    PT RECENTLY PUT HUSBAND IN NURSING HOME  . History of sleep apnea YRS AGO ON CPAP UNTIL LOST WT  . Pinched nerve LUMBAR    RESIDUAL LEFT FOOT NUMBNESS/ TINGLING  . Scoliosis   . Numbness and tingling of foot LEFT -- SECONDARY TO PINCHED LUMBAR NERVE  . Nocturia   . Stress incontinence   . Lesion of bladder     History   Social History  . Marital Status: Married    Spouse Name: N/A    Number of Children: N/A  . Years of Education: N/A   Occupational History  . retired    Social History Main Topics  . Smoking status: Former Smoker -- 1.50 packs/day for 20 years     Types: Cigarettes    Quit date: 07/31/1983  . Smokeless tobacco: Never Used  . Alcohol Use: No  . Drug Use: No  . Sexual Activity: Not on file   Other Topics Concern  . Not on file   Social History Narrative  . No narrative on file    Past Surgical History  Procedure Laterality Date  . Vaginal hysterectomy  1968  . Cholecystectomy  1976  . Cataract extraction w/ intraocular lens  implant, bilateral    . Cardiac catheterization  01-10-2003   DR Chrissie Noa DOWNEY    PATENT CIRCUMFLEX STENT/ MODERATE CAD ELSEWHERE  . Coronary angioplasty with stent placement  08-31-2001   DR BRUCE BRODIE    STENTING OF PROXIMAL CIRCUMFLEX (75% TO 0%)   . Cardiovascular stress test  07-27-2012    NORMAL NUCLEAR STUDY/ LVEF 81%  . Cystoscopy with biopsy  08/02/2012    Procedure: CYSTOSCOPY WITH BIOPSY;  Surgeon: Milford Cage, MD;  Location: [facility];  Service: Urology;  Laterality: N/A;    Family History  Problem Relation Age of Onset  . Heart disease Mother   . Cancer Mother     Allergies  Allergen Reactions  . Loratadine Other (See Comments)    Gi upset  . Adhesive [Tape] Other (See Comments)    Bruising and severe irritation  . Aspirin Other (See  Comments)    Gi upset  . Levaquin [Levofloxacin In D5w] Itching  . Levofloxacin Nausea And Vomiting  . Lumigan [Bimatoprost] Other (See Comments)    Severe burning of eyes  . Mobic [Meloxicam] Nausea And Vomiting and Other (See Comments)    hallucinations  . Penicillins Diarrhea and Nausea And Vomiting  . Statins Other (See Comments)    Severe muscle pain    Current Outpatient Prescriptions on File Prior to Visit  Medication Sig Dispense Refill  . amLODipine (NORVASC) 2.5 MG tablet Take 1 tablet (2.5 mg total) by mouth daily.  90 tablet  3  . Biotin (APPEAREX) 2.5 MG TABS Take 2.5 mg by mouth daily.      Marland Kitchen esomeprazole (NEXIUM) 40 MG capsule Take 1 capsule (40 mg total) by mouth every other day.  90 capsule  3   . fish oil-omega-3 fatty acids 1000 MG capsule Take 1 g by mouth 3 (three) times daily.      . hyoscyamine (LEVSIN, ANASPAZ) 0.125 MG tablet Take 0.125 mg by mouth every 4 (four) hours as needed.      . Lactobacillus (ACIDOPHILUS) 10 MG CAPS Take 1 capsule by mouth daily.       Marland Kitchen LORazepam (ATIVAN) 0.5 MG tablet TAKE ONE TABLET BY MOUTH EVERY 8 HOURS AS NEEDED FOR ANXIETY  60 tablet  0  . niacinamide 100 MG tablet Take 1000 units twice daily      . nitroGLYCERIN (NITROSTAT) 0.4 MG SL tablet Place 1 tablet (0.4 mg total) under the tongue every 5 (five) minutes as needed for chest pain.  25 tablet  3  . oxybutynin (DITROPAN) 5 MG tablet Take 5 mg by mouth 2 (two) times daily.       . Probiotic Product (ALIGN PO) Take 1 capsule by mouth daily.      Marland Kitchen pyridOXINE (B-6) 50 MG tablet Take 50 mg by mouth daily.      Marland Kitchen SYNTHROID 125 MCG tablet TAKE ONE TABLET BY MOUTH ONCE DAILY.  90 tablet  3  . vitamin B-12 (CYANOCOBALAMIN) 500 MCG tablet Take 500 mcg by mouth daily.       . vitamin E 400 UNIT capsule Take 400 Units by mouth daily.      Marland Kitchen atenolol (TENORMIN) 25 MG tablet Take 1 tablet (25 mg total) by mouth 2 (two) times daily.  60 tablet  11   No current facility-administered medications on file prior to visit.    BP 142/70  Pulse 72  Temp(Src) 98.2 F (36.8 C)  Resp 16  Ht 4\' 11"  (1.499 m)  Wt 134 lb (60.782 kg)  BMI 27.05 kg/m2    Objective:   Physical Exam  Constitutional: She is oriented to person, place, and time. She appears well-developed and well-nourished. No distress.  HENT:  Head: Normocephalic and atraumatic.  Oropharyngeal erythema, left and right tympanic membrane retracted and dull in appearance  Eyes: EOM are normal. Pupils are equal, round, and reactive to light.  Neck: Neck supple.  No neck tenderness  Cardiovascular: Normal rate, regular rhythm and normal heart sounds.   No murmur heard. Pulmonary/Chest: Effort normal and breath sounds normal. She has no wheezes.   Neurological: She is alert and oriented to person, place, and time. No cranial nerve deficit.  Skin: Skin is warm and dry.  Psychiatric: She has a normal mood and affect. Her behavior is normal.          Assessment & Plan:

## 2013-09-29 NOTE — Patient Instructions (Signed)
Use over-the-counter Mucinex every 12 hours as needed Use intranasal saline spray every 4 hours as needed Please contact our office if your symptoms do not improve or gets worse.

## 2013-09-29 NOTE — Assessment & Plan Note (Signed)
Patient not having any symptoms of angina but requests refill for sublingual nitroglycerin.

## 2013-09-29 NOTE — Telephone Encounter (Signed)
Patient Information:  Caller Name: Willella  Phone: [phone]  Patient: Joyce [name], Joyce [name]  Gender: Female  DOB: 1931-08-25  Age: 79 Years  PCP: Darryll Capers (Adults only)  Office Follow Up:  Does the office need to follow up with this patient?: No  Instructions For The Office: N/A  RN Note:  Informed must be seen for antibiotics.  Reports pain above the eyes. Discomfort present in left ear. Hydrate and humidify.  Call if symptoms worsen.   Symptoms  Reason For Call & Symptoms: Sinus congestion with yellow/brown productive cough.  Called to request antibiotics.  Reviewed Health History In EMR: Yes  Reviewed Medications In EMR: Yes  Reviewed Allergies In EMR: Yes  Reviewed Surgeries / Procedures: Yes  Date of Onset of Symptoms: 09/22/2013  Treatments Tried: Neti Pot, Chestal honey (homopathic)  Treatments Tried Worked: Yes  Guideline(s) Used:  Sinus Pain and Congestion  Disposition Per Guideline:   See Today in Office  Reason For Disposition Reached:   Earache  Advice Given:  N/A  Patient Will Follow Care Advice:  YES  Appointment Scheduled:  09/29/2013 14:00:00 Appointment Scheduled Provider:  Artist Pais, Doe-Hyun Molly Maduro) (Adults only)

## 2013-09-29 NOTE — Assessment & Plan Note (Signed)
79 year old white female with signs and symptoms of possible sinusitis versus early bronchitis. She visits her husband who lives in a nursing home. Several nursing home residents with similar symptoms. Treat with doxycycline 100 mg twice daily for 10 days.  Patient advised to call office if symptoms persist or worsen.

## 2013-11-22 ENCOUNTER — Other Ambulatory Visit: Payer: Self-pay | Admitting: Internal Medicine

## 2013-11-25 ENCOUNTER — Observation Stay (HOSPITAL_COMMUNITY): Payer: Medicare Other

## 2013-11-25 ENCOUNTER — Inpatient Hospital Stay (HOSPITAL_COMMUNITY)
Admission: EM | Admit: 2013-11-25 | Discharge: 2013-11-27 | DRG: 065 | Disposition: A | Discharge status: Home / Self Care | Payer: Medicare Other | Attending: Family Medicine | Admitting: Family Medicine

## 2013-11-25 ENCOUNTER — Emergency Department (HOSPITAL_COMMUNITY): Payer: Medicare Other

## 2013-11-25 ENCOUNTER — Encounter (HOSPITAL_COMMUNITY): Payer: Self-pay | Admitting: Emergency Medicine

## 2013-11-25 DIAGNOSIS — E039 Hypothyroidism, unspecified: Secondary | ICD-10-CM

## 2013-11-25 DIAGNOSIS — Z9861 Coronary angioplasty status: Secondary | ICD-10-CM

## 2013-11-25 DIAGNOSIS — I10 Essential (primary) hypertension: Secondary | ICD-10-CM

## 2013-11-25 DIAGNOSIS — Z87891 Personal history of nicotine dependence: Secondary | ICD-10-CM

## 2013-11-25 DIAGNOSIS — B951 Streptococcus, group B, as the cause of diseases classified elsewhere: Secondary | ICD-10-CM | POA: Diagnosis present

## 2013-11-25 DIAGNOSIS — B372 Candidiasis of skin and nail: Secondary | ICD-10-CM

## 2013-11-25 DIAGNOSIS — T887XXA Unspecified adverse effect of drug or medicament, initial encounter: Secondary | ICD-10-CM

## 2013-11-25 DIAGNOSIS — R634 Abnormal weight loss: Secondary | ICD-10-CM

## 2013-11-25 DIAGNOSIS — F4322 Adjustment disorder with anxiety: Secondary | ICD-10-CM

## 2013-11-25 DIAGNOSIS — R079 Chest pain, unspecified: Secondary | ICD-10-CM

## 2013-11-25 DIAGNOSIS — Z79899 Other long term (current) drug therapy: Secondary | ICD-10-CM

## 2013-11-25 DIAGNOSIS — K648 Other hemorrhoids: Secondary | ICD-10-CM

## 2013-11-25 DIAGNOSIS — I6350 Cerebral infarction due to unspecified occlusion or stenosis of unspecified cerebral artery: Principal | ICD-10-CM | POA: Diagnosis present

## 2013-11-25 DIAGNOSIS — N390 Urinary tract infection, site not specified: Secondary | ICD-10-CM | POA: Diagnosis present

## 2013-11-25 DIAGNOSIS — G459 Transient cerebral ischemic attack, unspecified: Secondary | ICD-10-CM

## 2013-11-25 DIAGNOSIS — R002 Palpitations: Secondary | ICD-10-CM

## 2013-11-25 DIAGNOSIS — I639 Cerebral infarction, unspecified: Secondary | ICD-10-CM

## 2013-11-25 DIAGNOSIS — Z8673 Personal history of transient ischemic attack (TIA), and cerebral infarction without residual deficits: Secondary | ICD-10-CM | POA: Diagnosis present

## 2013-11-25 DIAGNOSIS — F439 Reaction to severe stress, unspecified: Secondary | ICD-10-CM

## 2013-11-25 DIAGNOSIS — G4733 Obstructive sleep apnea (adult) (pediatric): Secondary | ICD-10-CM | POA: Diagnosis present

## 2013-11-25 DIAGNOSIS — J329 Chronic sinusitis, unspecified: Secondary | ICD-10-CM

## 2013-11-25 DIAGNOSIS — K219 Gastro-esophageal reflux disease without esophagitis: Secondary | ICD-10-CM

## 2013-11-25 DIAGNOSIS — M545 Low back pain, unspecified: Secondary | ICD-10-CM | POA: Diagnosis present

## 2013-11-25 DIAGNOSIS — F411 Generalized anxiety disorder: Secondary | ICD-10-CM | POA: Diagnosis present

## 2013-11-25 DIAGNOSIS — R001 Bradycardia, unspecified: Secondary | ICD-10-CM

## 2013-11-25 DIAGNOSIS — M79605 Pain in left leg: Secondary | ICD-10-CM

## 2013-11-25 DIAGNOSIS — R29898 Other symptoms and signs involving the musculoskeletal system: Secondary | ICD-10-CM | POA: Diagnosis not present

## 2013-11-25 DIAGNOSIS — Z0000 Encounter for general adult medical examination without abnormal findings: Secondary | ICD-10-CM

## 2013-11-25 DIAGNOSIS — E785 Hyperlipidemia, unspecified: Secondary | ICD-10-CM

## 2013-11-25 DIAGNOSIS — I2510 Atherosclerotic heart disease of native coronary artery without angina pectoris: Secondary | ICD-10-CM

## 2013-11-25 DIAGNOSIS — Z006 Encounter for examination for normal comparison and control in clinical research program: Secondary | ICD-10-CM

## 2013-11-25 DIAGNOSIS — Z8719 Personal history of other diseases of the digestive system: Secondary | ICD-10-CM

## 2013-11-25 LAB — CREATININE, SERUM: Creatinine, Ser: 0.52 mg/dL (ref 0.50–1.10)

## 2013-11-25 LAB — PROTIME-INR
INR: 1.04 (ref 0.00–1.49)
Prothrombin Time: 13.4 seconds (ref 11.6–15.2)

## 2013-11-25 LAB — CBC
HCT: 41 % (ref 36.0–46.0)
HCT: 41.1 % (ref 36.0–46.0)
Hemoglobin: 14.6 g/dL (ref 12.0–15.0)
Hemoglobin: 14.6 g/dL (ref 12.0–15.0)
MCH: 30.4 pg (ref 26.0–34.0)
MCH: 30.2 pg (ref 26.0–34.0)
MCV: 84.9 fL (ref 78.0–100.0)
Platelets: 192 10*3/uL (ref 150–400)
RBC: 4.81 MIL/uL (ref 3.87–5.11)
RBC: 4.84 MIL/uL (ref 3.87–5.11)
RDW: 13.4 % (ref 11.5–15.5)
WBC: 5.7 10*3/uL (ref 4.0–10.5)

## 2013-11-25 LAB — DIFFERENTIAL
Basophils Absolute: 0 10*3/uL (ref 0.0–0.1)
Basophils Relative: 0 % (ref 0–1)
Eosinophils Absolute: 0.1 10*3/uL (ref 0.0–0.7)
Eosinophils Relative: 2 % (ref 0–5)
Lymphs Abs: 1.7 10*3/uL (ref 0.7–4.0)
Monocytes Relative: 7 % (ref 3–12)
Neutro Abs: 3.3 10*3/uL (ref 1.7–7.7)
Neutrophils Relative %: 60 % (ref 43–77)

## 2013-11-25 LAB — TSH: TSH: 0.037 u[IU]/mL — ABNORMAL LOW (ref 0.350–4.500)

## 2013-11-25 LAB — COMPREHENSIVE METABOLIC PANEL
ALT: 20 U/L (ref 0–35)
AST: 26 U/L (ref 0–37)
Albumin: 3.7 g/dL (ref 3.5–5.2)
Alkaline Phosphatase: 60 U/L (ref 39–117)
Calcium: 9.5 mg/dL (ref 8.4–10.5)
Chloride: 101 mEq/L (ref 96–112)
GFR calc Af Amer: >90 mL/min (ref >90)
Glucose, Bld: 105 mg/dL — ABNORMAL HIGH (ref 70–99)
Potassium: 4 mEq/L (ref 3.5–5.1)
Sodium: 139 mEq/L (ref 135–145)
Total Protein: 7.5 g/dL (ref 6.0–8.3)

## 2013-11-25 LAB — POCT I-STAT, CHEM 8
BUN: 11 mg/dL (ref 6–23)
Chloride: 101 mEq/L (ref 96–112)
Creatinine, Ser: 0.8 mg/dL (ref 0.50–1.10)
Glucose, Bld: 104 mg/dL — ABNORMAL HIGH (ref 70–99)
Potassium: 4 mEq/L (ref 3.5–5.1)

## 2013-11-25 LAB — APTT: aPTT: 28 seconds (ref 24–37)

## 2013-11-25 LAB — RAPID URINE DRUG SCREEN, HOSP PERFORMED
Cocaine: NOT DETECTED
Opiates: NOT DETECTED
Tetrahydrocannabinol: NOT DETECTED

## 2013-11-25 LAB — GLUCOSE, CAPILLARY: Glucose-Capillary: 98 mg/dL (ref 70–99)

## 2013-11-25 MED ORDER — LEVOTHYROXINE SODIUM 125 MCG PO TABS
125.0000 ug | ORAL_TABLET | Freq: Every day | ORAL | Status: DC
  Administered 2013-11-26 – 2013-11-27 (×2): 125 ug via ORAL
  Filled 2013-11-25 (×3): qty 1

## 2013-11-25 MED ORDER — STUDY - INVESTIGATIONAL DRUG SIMPLE RECORD
600.0000 mg | Status: AC
  Administered 2013-11-25: 600 mg via ORAL
  Filled 2013-11-25 (×2): qty 600

## 2013-11-25 MED ORDER — STUDY - INVESTIGATIONAL DRUG SIMPLE RECORD
75.0000 mg | Freq: Every day | Status: DC
  Administered 2013-11-26 – 2013-11-27 (×2): 75 mg via ORAL
  Filled 2013-11-25 (×4): qty 75

## 2013-11-25 MED ORDER — NITROGLYCERIN 0.4 MG SL SUBL: 0.4000 mg | SUBLINGUAL_TABLET | SUBLINGUAL | Status: DC | PRN

## 2013-11-25 MED ORDER — ENOXAPARIN SODIUM 40 MG/0.4ML ~~LOC~~ SOLN: 40.0000 mg | SUBCUTANEOUS | Status: DC

## 2013-11-25 MED ORDER — VITAMIN B-6 50 MG PO TABS
50.0000 mg | ORAL_TABLET | Freq: Every day | ORAL | Status: DC
  Administered 2013-11-25 – 2013-11-27 (×3): 50 mg via ORAL
  Filled 2013-11-25 (×3): qty 1

## 2013-11-25 MED ORDER — LORAZEPAM 0.5 MG PO TABS
0.5000 mg | ORAL_TABLET | Freq: Three times a day (TID) | ORAL | Status: DC | PRN
  Administered 2013-11-25 – 2013-11-26 (×2): 0.5 mg via ORAL
  Filled 2013-11-25 (×2): qty 1

## 2013-11-25 MED ORDER — PANTOPRAZOLE SODIUM 40 MG PO TBEC: 40.0000 mg | DELAYED_RELEASE_TABLET | Freq: Every day | ORAL | Status: DC

## 2013-11-25 MED ORDER — RANITIDINE HCL 150 MG/10ML PO SYRP: 75.0000 mg | ORAL_SOLUTION | Freq: Two times a day (BID) | ORAL | Status: DC

## 2013-11-25 MED ORDER — ASPIRIN EC 81 MG PO TBEC
81.0000 mg | DELAYED_RELEASE_TABLET | Freq: Every day | ORAL | Status: DC
  Administered 2013-11-25 – 2013-11-27 (×3): 81 mg via ORAL
  Filled 2013-11-25 (×3): qty 1

## 2013-11-25 MED ORDER — AMLODIPINE BESYLATE 2.5 MG PO TABS
2.5000 mg | ORAL_TABLET | Freq: Every day | ORAL | Status: DC
  Administered 2013-11-25 – 2013-11-26 (×2): 2.5 mg via ORAL
  Filled 2013-11-25 (×2): qty 1

## 2013-11-25 MED ORDER — WHITE PETROLATUM GEL
Status: AC
  Administered 2013-11-26: 0.2
  Filled 2013-11-25: qty 5

## 2013-11-25 MED ORDER — OXYBUTYNIN CHLORIDE 5 MG PO TABS
5.0000 mg | ORAL_TABLET | Freq: Two times a day (BID) | ORAL | Status: DC
  Administered 2013-11-25 – 2013-11-27 (×4): 5 mg via ORAL
  Filled 2013-11-25 (×5): qty 1

## 2013-11-25 NOTE — ED Notes (Signed)
Phlebotomy at bedside.

## 2013-11-25 NOTE — ED Notes (Signed)
Dr Pearlean Brownie into see pt family in room

## 2013-11-25 NOTE — Consult Note (Signed)
Referring Physician: Deretha Emory    Chief Complaint: right arm and leg weakness  HPI:                                                                                                                                         Joyce [name] is an 79 y.o. female who was sitting at her computer this am when suddenly she noted right arm and leg weakness and paresthesia. Patient was brought by EMS and code stroke was called while in the ED.  CT head was obtained showing no abnormal findings. On exam after CT she was complaining of decreased sensation of LEFT arm and leg decreased sensation to PP but not LT.  She has known decreased sensation to left leg prior due to nerve impingement. No other focal findings on exam.  tPA was not given due to minimal non disabling symptoms.   Date last known well: Date: 11/25/2013 Time last known well: Time: 11:15 tPA Given: No: minimal symptoms  Past Medical History  Diagnosis Date  . Unspecified essential hypertension   . Hyperlipidemia   . GERD (gastroesophageal reflux disease)   . Candidiasis of skin and nails   . Internal hemorrhoids without mention of complication   . Hypothyroidism   . IBS (irritable bowel syndrome)   . Coronary atherosclerosis of unspecified type of vessel, native or graft CARDIOLOGIST- DR [facility]    DENIES S & S  . Borderline glaucoma   . Adjustment disorder with anxiety STRESS CHEST DISCOMFORT    PT RECENTLY PUT HUSBAND IN NURSING HOME  . History of sleep apnea YRS AGO ON CPAP UNTIL LOST WT  . Pinched nerve LUMBAR    RESIDUAL LEFT FOOT NUMBNESS/ TINGLING  . Scoliosis   . Numbness and tingling of foot LEFT -- SECONDARY TO PINCHED LUMBAR NERVE  . Nocturia   . Stress incontinence   . Lesion of bladder     Past Surgical History  Procedure Laterality Date  . Vaginal hysterectomy  1968  . Cholecystectomy  1976  . Cataract extraction w/ intraocular lens  implant, bilateral    . Cardiac catheterization  01-10-2003   DR Chrissie Noa DOWNEY     PATENT CIRCUMFLEX STENT/ MODERATE CAD ELSEWHERE  . Coronary angioplasty with stent placement  08-31-2001   DR BRUCE BRODIE    STENTING OF PROXIMAL CIRCUMFLEX (75% TO 0%)   . Cardiovascular stress test  07-27-2012    NORMAL NUCLEAR STUDY/ LVEF 81%  . Cystoscopy with biopsy  08/02/2012    Procedure: CYSTOSCOPY WITH BIOPSY;  Surgeon: Milford Cage, MD;  Location: [facility];  Service: Urology;  Laterality: N/A;    Family History  Problem Relation Age of Onset  . Heart disease Mother   . Cancer Mother    Social History:  reports that she quit smoking about 30 years ago. Her smoking use included Cigarettes. She has a  30 pack-year smoking history. She has never used smokeless tobacco. She reports that she does not drink alcohol or use illicit drugs.  Allergies:  Allergies  Allergen Reactions  . Loratadine Other (See Comments)    Gi upset  . Adhesive [Tape] Other (See Comments)    Bruising and severe irritation  . Aspirin Other (See Comments)    Gi upset  . Levaquin [Levofloxacin In D5w] Itching  . Levofloxacin Nausea And Vomiting  . Lumigan [Bimatoprost] Other (See Comments)    Severe burning of eyes  . Mobic [Meloxicam] Nausea And Vomiting and Other (See Comments)    hallucinations  . Penicillins Diarrhea and Nausea And Vomiting  . Statins Other (See Comments)    Severe muscle pain    Medications:                                                                                                                           No current facility-administered medications for this encounter.   Current Outpatient Prescriptions  Medication Sig Dispense Refill  . amLODipine (NORVASC) 2.5 MG tablet Take 1 tablet (2.5 mg total) by mouth daily.  90 tablet  3  . atenolol (TENORMIN) 25 MG tablet Take 1 tablet (25 mg total) by mouth 2 (two) times daily.  60 tablet  11  . Biotin (APPEAREX) 2.5 MG TABS Take 2.5 mg by mouth daily.      Marland Kitchen doxycycline (VIBRA-TABS) 100 MG  tablet Take 1 tablet (100 mg total) by mouth 2 (two) times daily.  20 tablet  0  . esomeprazole (NEXIUM) 40 MG capsule Take 1 capsule (40 mg total) by mouth every other day.  90 capsule  3  . fish oil-omega-3 fatty acids 1000 MG capsule Take 1 g by mouth 3 (three) times daily.      . hyoscyamine (LEVSIN, ANASPAZ) 0.125 MG tablet Take 0.125 mg by mouth every 4 (four) hours as needed.      . Lactobacillus (ACIDOPHILUS) 10 MG CAPS Take 1 capsule by mouth daily.       Marland Kitchen LORazepam (ATIVAN) 0.5 MG tablet TAKE ONE TABLET BY MOUTH EVERY 8 HOURS AS NEEDED FOR ANXIETY  60 tablet  0  . niacinamide 100 MG tablet Take 1000 units twice daily      . nitroGLYCERIN (NITROSTAT) 0.4 MG SL tablet Place 1 tablet (0.4 mg total) under the tongue every 5 (five) minutes as needed for chest pain.  25 tablet  3  . oxybutynin (DITROPAN) 5 MG tablet Take 5 mg by mouth 2 (two) times daily.       . Probiotic Product (ALIGN PO) Take 1 capsule by mouth daily.      Marland Kitchen pyridOXINE (B-6) 50 MG tablet Take 50 mg by mouth daily.      Marland Kitchen SYNTHROID 125 MCG tablet TAKE ONE TABLET BY MOUTH ONCE DAILY.  90 tablet  3  . vitamin B-12 (CYANOCOBALAMIN) 500 MCG tablet Take 500 mcg by mouth daily.       Marland Kitchen  vitamin E 400 UNIT capsule Take 400 Units by mouth daily.         ROS:                                                                                                                                       History obtained from the patient  General ROS: negative for - chills, fatigue, fever, night sweats, weight gain or weight loss Psychological ROS: negative for - behavioral disorder, hallucinations, memory difficulties, mood swings or suicidal ideation Ophthalmic ROS: negative for - blurry vision, double vision, eye pain or loss of vision ENT ROS: negative for - epistaxis, nasal discharge, oral lesions, sore throat, tinnitus or vertigo Allergy and Immunology ROS: negative for - hives or itchy/watery eyes Hematological and Lymphatic ROS:  negative for - bleeding problems, bruising or swollen lymph nodes Endocrine ROS: negative for - galactorrhea, hair pattern changes, polydipsia/polyuria or temperature intolerance Respiratory ROS: negative for - cough, hemoptysis, shortness of breath or wheezing Cardiovascular ROS: negative for - chest pain, dyspnea on exertion, edema or irregular heartbeat Gastrointestinal ROS: negative for - abdominal pain, diarrhea, hematemesis, nausea/vomiting or stool incontinence Genito-Urinary ROS: negative for - dysuria, hematuria, incontinence or urinary frequency/urgency Musculoskeletal ROS: negative for - joint swelling or muscular weakness Neurological ROS: as noted in HPI Dermatological ROS: negative for rash and skin lesion changes  Neurologic Examination:                                                                                                      Blood pressure 203/62, pulse 49, temperature 97.9 F (36.6 C), temperature source Oral, resp. rate 19, SpO2 100.00%.  Mental Status: Alert, oriented, thought content appropriate.  Speech fluent without evidence of aphasia.  Able to follow 3 step commands without difficulty. Cranial Nerves: II: Discs flat bilaterally; Visual fields grossly normal, pupils equal, round, reactive to light and accommodation III,IV, VI: ptosis not present, extra-ocular motions intact bilaterally V,VII: smile symmetric, facial light touch sensation normal bilaterally VIII: hearing normal bilaterally IX,X: gag reflex present XI: bilateral shoulder shrug XII: midline tongue extension without atrophy or fasciculations  Motor: Right : Upper extremity   5/5    Left:     Upper extremity   5/5  Lower extremity   5/5     Lower extremity   5/5 Tone and bulk:normal tone throughout; no atrophy noted Sensory: Pinprick decreased on the left arm and leg but light touch intact throughout, bilaterally Deep Tendon Reflexes:  Right: Upper Extremity   Left: Upper extremity    biceps (C-5 to C-6) 2/4   biceps (C-5 to C-6) 2/4 tricep (C7) 2/4    triceps (C7) 2/4 Brachioradialis (C6) 2/4  Brachioradialis (C6) 2/4  Lower Extremity 1+ bilaterally  Plantars: Right: downgoing   Left: downgoing Cerebellar: normal finger-to-nose,  normal heel-to-shin test Gait: not examined due to acuity.  CV: pulses palpable throughout    Results for orders placed during the hospital encounter of 11/25/13 (from the past 48 hour(s))  GLUCOSE, CAPILLARY     Status: Abnormal   Collection Time    11/25/13 12:24 PM      Result Value Range   Glucose-Capillary 102 (*) 70 - 99 mg/dL   Ct Head (brain) Wo Contrast  11/25/2013   CLINICAL DATA:  79 year old female code stroke with right-sided symptoms. Initial encounter.  EXAM: CT HEAD WITHOUT CONTRAST  TECHNIQUE: Contiguous axial images were obtained from the base of the skull through the vertex without intravenous contrast.  COMPARISON:  08/30/2007.  FINDINGS: Visualized paranasal sinuses and mastoids are clear. No acute osseous abnormality identified. No acute orbit or scalp soft tissue findings.  Calcified atherosclerosis at the skull base. No ventriculomegaly. Stable right frontal lobe sulcal variation (series 2, image 19). Less likely, this could reflect a small chronic arachnoid cyst. No acute intracranial hemorrhage identified. No evidence of cortically based acute infarction identified. Density of major intracranial vascular structures appear stable. Stable and normal gray-white matter differentiation.  IMPRESSION: No acute cortically based infarct or intracranial hemorrhage. Stable non contrast CT appearance of the brain since 2008.  Study discussed by telephone with Dr. Cyril Mourning on 11/25/2013 at 12:42 .   Electronically Signed   By: Augusto Gamble M.D.   On: 11/25/2013 12:43    Assessment and plan discussed with with attending physician and they are in agreement.    Felicie Morn PA-C [facility] [phone]  11/25/2013,  12:55 PM   Assessment: 79 y.o. female with transient right arm and leg weakness and paresthesia which has resolved but now complaining of left arm and leg decreased sensation (leg symptoms are old). Cannot rule out TIA but not tPA candidate due to minimal symptoms which do not fit anatomical distribution.   Stroke Risk Factors - hyperlipidemia and hypertension  Recommend : 1) MRI/MRA brain--if negative would not go further in stroke workup.  If positive would finish stroke evaluation and place on ASA daily.  2) Will follow result.    Patient seen and examined together with physician assistant and I concur with the assessment and plan.  Wyatt Portela, MD

## 2013-11-25 NOTE — Code Documentation (Signed)
79yo female arriving to [facility] via [facility].  Patient reports that at 1115 she was sitting at her computer and she developed right sided numbness and tingling and right leg weakness.  Her right numbness and tingling has subsided, but she continues to report that her right leg is weak.  No drift in right leg and good strength noted by PA.  CT completed.  NIHSS 1 on arrival for left arm and leg decreased sensation.  Patient reports a pinched nerve in her back that causes numbness and tingling on the left side and her current paresthesias are consistent with this.  Patient reports HTN and stress, she is anxious and tearful.  No acute stroke treatment at this time per Dr. Leroy Kennedy.  Patient remains in the tPA window until 1545 should her symptoms worsen.  Bedside handoff with ED RN Dorene Grebe.

## 2013-11-25 NOTE — Progress Notes (Signed)
11/25/2013 Pt enrolled in [place] study trial.  Must avoid heparin, lovenox and non-study plavix while enrolled in the [place] Trial. Herby Abraham, Pharm.D. [phone] 11/25/2013 5:45 PM

## 2013-11-25 NOTE — Progress Notes (Addendum)
Dr. Catha Gosselin paged notifying patient's arrival to room. Dr. Pearlean Brownie is aware of patient's arrival.

## 2013-11-25 NOTE — ED Notes (Signed)
EDP decided to call code stroke.

## 2013-11-25 NOTE — ED Notes (Signed)
Per EMS - pt had sudden onset of right sided weakness 1115am this morning, felt like her right leg numbness/tingling then progressed to her right arm. No slurred speech. Pt lives by herself. Neighbor came over around 1115, nobody saw her before this. Pt reports she woke at 950 am with no symptoms, feeling fine. BP 180/60, HR 58.

## 2013-11-25 NOTE — ED Notes (Signed)
EDP at bedside to clear airway and determine if pt is a code stroke.

## 2013-11-25 NOTE — ED Provider Notes (Addendum)
CSN: [id number]     Arrival date & time 11/25/13  1214 History   First MD Initiated Contact with Patient 11/25/13 1222     Chief Complaint  Patient presents with  . Code Stroke   (Consider location/radiation/quality/duration/timing/severity/associated sxs/prior Treatment) The history is provided by the patient and the EMS personnel.   patient is 79 year old female has history of significant for coronary disease. Brought in by EMS for sudden onset of right leg weakness that occurred around 11:15. Shortly prior to that patient has had down at the computer but seemed to be fine when she went to get up her right leg would not move properly and was weak. Symptoms are improving some according to the patient. Patient's primary care doctor is Dr. Gilford Rile from [facility]. Patient without any other complaints.  Past Medical History  Diagnosis Date  . Unspecified essential hypertension   . Hyperlipidemia   . GERD (gastroesophageal reflux disease)   . Candidiasis of skin and nails   . Internal hemorrhoids without mention of complication   . Hypothyroidism   . IBS (irritable bowel syndrome)   . Coronary atherosclerosis of unspecified type of vessel, native or graft CARDIOLOGIST- [facility]    DENIES S & S  . Borderline glaucoma   . Adjustment disorder with anxiety STRESS CHEST DISCOMFORT    PT RECENTLY PUT HUSBAND IN NURSING HOME  . History of sleep apnea YRS AGO ON CPAP UNTIL LOST WT  . Pinched nerve LUMBAR    RESIDUAL LEFT FOOT NUMBNESS/ TINGLING  . Scoliosis   . Numbness and tingling of foot LEFT -- SECONDARY TO PINCHED LUMBAR NERVE  . Nocturia   . Stress incontinence   . Lesion of bladder    Past Surgical History  Procedure Laterality Date  . Vaginal hysterectomy  1968  . Cholecystectomy  1976  . Cataract extraction w/ intraocular lens  implant, bilateral    . Cardiac catheterization  01-10-2003   DR Chrissie Noa DOWNEY    PATENT CIRCUMFLEX STENT/ MODERATE CAD ELSEWHERE  . Coronary angioplasty  with stent placement  08-31-2001   DR BRUCE BRODIE    STENTING OF PROXIMAL CIRCUMFLEX (75% TO 0%)   . Cardiovascular stress test  07-27-2012    NORMAL NUCLEAR STUDY/ LVEF 81%  . Cystoscopy with biopsy  08/02/2012    Procedure: CYSTOSCOPY WITH BIOPSY;  Surgeon: Milford Cage, MD;  Location: [facility];  Service: Urology;  Laterality: N/A;   Family History  Problem Relation Age of Onset  . Heart disease Mother   . Cancer Mother    History  Substance Use Topics  . Smoking status: Former Smoker -- 1.50 packs/day for 20 years    Types: Cigarettes    Quit date: 07/31/1983  . Smokeless tobacco: Never Used  . Alcohol Use: No   OB History   Grav Para Term Preterm Abortions TAB SAB Ect Mult Living                 Review of Systems  Constitutional: Negative for fever.  HENT: Negative for congestion.   Eyes: Negative for visual disturbance.  Respiratory: Negative for shortness of breath.   Cardiovascular: Negative for chest pain.  Gastrointestinal: Negative for nausea, vomiting and abdominal pain.  Genitourinary: Negative for dysuria.  Musculoskeletal: Negative for back pain.  Skin: Negative for rash.  Neurological: Positive for weakness. Negative for speech difficulty, numbness and headaches.  Hematological: Does not bruise/bleed easily.  Psychiatric/Behavioral: Negative for confusion.    Allergies  Loratadine; Adhesive; Aspirin; Levaquin; Levofloxacin; Lumigan; Mobic; Penicillins; and Statins  Home Medications   Current Outpatient Rx  Name  Route  Sig  Dispense  Refill  . amLODipine (NORVASC) 2.5 MG tablet   Oral   Take 1 tablet (2.5 mg total) by mouth daily.   90 tablet   3   . EXPIRED: atenolol (TENORMIN) 25 MG tablet   Oral   Take 1 tablet (25 mg total) by mouth 2 (two) times daily.   60 tablet   11   . Biotin (APPEAREX) 2.5 MG TABS   Oral   Take 2.5 mg by mouth daily.         Marland Kitchen doxycycline (VIBRA-TABS) 100 MG tablet   Oral   Take 1  tablet (100 mg total) by mouth 2 (two) times daily.   20 tablet   0   . esomeprazole (NEXIUM) 40 MG capsule   Oral   Take 1 capsule (40 mg total) by mouth every other day.   90 capsule   3   . fish oil-omega-3 fatty acids 1000 MG capsule   Oral   Take 1 g by mouth 3 (three) times daily.         . hyoscyamine (LEVSIN, ANASPAZ) 0.125 MG tablet   Oral   Take 0.125 mg by mouth every 4 (four) hours as needed.         . Lactobacillus (ACIDOPHILUS) 10 MG CAPS   Oral   Take 1 capsule by mouth daily.          Marland Kitchen LORazepam (ATIVAN) 0.5 MG tablet      TAKE ONE TABLET BY MOUTH EVERY 8 HOURS AS NEEDED FOR ANXIETY   60 tablet   0   . niacinamide 100 MG tablet      Take 1000 units twice daily         . nitroGLYCERIN (NITROSTAT) 0.4 MG SL tablet   Sublingual   Place 1 tablet (0.4 mg total) under the tongue every 5 (five) minutes as needed for chest pain.   25 tablet   3   . oxybutynin (DITROPAN) 5 MG tablet   Oral   Take 5 mg by mouth 2 (two) times daily.          . Probiotic Product (ALIGN PO)   Oral   Take 1 capsule by mouth daily.         Marland Kitchen pyridOXINE (B-6) 50 MG tablet   Oral   Take 50 mg by mouth daily.         Marland Kitchen SYNTHROID 125 MCG tablet      TAKE ONE TABLET BY MOUTH ONCE DAILY.   90 tablet   3     Dispense as written.   . vitamin B-12 (CYANOCOBALAMIN) 500 MCG tablet   Oral   Take 500 mcg by mouth daily.          . vitamin E 400 UNIT capsule   Oral   Take 400 Units by mouth daily.          BP 196/70  Pulse 55  Temp(Src) 97.9 F (36.6 C) (Oral)  Resp 16  SpO2 97% Physical Exam  Nursing note and vitals reviewed. Constitutional: She is oriented to person, place, and time. She appears well-developed and well-nourished. No distress.  HENT:  Head: Normocephalic and atraumatic.  Mouth/Throat: Oropharynx is clear and moist.  Eyes: Conjunctivae and EOM are normal. Pupils are equal, round, and reactive to light.  Neck: Normal range  of  motion.  Cardiovascular: Normal rate, regular rhythm and normal heart sounds.   No murmur heard. Bradycardia  Pulmonary/Chest: Effort normal and breath sounds normal. No respiratory distress.  Abdominal: Soft. Bowel sounds are normal. There is no tenderness.  Musculoskeletal: She exhibits no edema.  Right leg weakness compared to left.  Neurological: She is alert and oriented to person, place, and time. No cranial nerve deficit.  Right leg weakness initially 3-4/5. Left leg with normal strength. Both arms with normal strength. No cranial nerve deficits no speech problems. No arm drift.  Skin: Skin is warm. No rash noted.    ED Course  Procedures (including critical care time) Labs Review Labs Reviewed  COMPREHENSIVE METABOLIC PANEL - Abnormal; Notable for the following:    Glucose, Bld 105 (*)    Creatinine, Ser 0.48 (*)    Total Bilirubin 1.3 (*)    All other components within normal limits  GLUCOSE, CAPILLARY - Abnormal; Notable for the following:    Glucose-Capillary 102 (*)    All other components within normal limits  POCT I-STAT, CHEM 8 - Abnormal; Notable for the following:    Glucose, Bld 104 (*)    All other components within normal limits  PROTIME-INR  APTT  CBC  DIFFERENTIAL  TROPONIN I   Results for orders placed during the hospital encounter of 11/25/13  PROTIME-INR      Result Value Range   Prothrombin Time 13.4  11.6 - 15.2 seconds   INR 1.04  0.00 - 1.49  APTT      Result Value Range   aPTT 28  24 - 37 seconds  CBC      Result Value Range   WBC 5.5  4.0 - 10.5 K/uL   RBC 4.81  3.87 - 5.11 MIL/uL   Hemoglobin 14.6  12.0 - 15.0 g/dL   HCT 78.2  95.6 - 21.3 %   MCV 85.2  78.0 - 100.0 fL   MCH 30.4  26.0 - 34.0 pg   MCHC 35.6  30.0 - 36.0 g/dL   RDW 08.6  57.8 - 46.9 %   Platelets 192  150 - 400 K/uL  DIFFERENTIAL      Result Value Range   Neutrophils Relative % 60  43 - 77 %   Neutro Abs 3.3  1.7 - 7.7 K/uL   Lymphocytes Relative 31  12 - 46 %    Lymphs Abs 1.7  0.7 - 4.0 K/uL   Monocytes Relative 7  3 - 12 %   Monocytes Absolute 0.4  0.1 - 1.0 K/uL   Eosinophils Relative 2  0 - 5 %   Eosinophils Absolute 0.1  0.0 - 0.7 K/uL   Basophils Relative 0  0 - 1 %   Basophils Absolute 0.0  0.0 - 0.1 K/uL  COMPREHENSIVE METABOLIC PANEL      Result Value Range   Sodium 139  135 - 145 mEq/L   Potassium 4.0  3.5 - 5.1 mEq/L   Chloride 101  96 - 112 mEq/L   CO2 28  19 - 32 mEq/L   Glucose, Bld 105 (*) 70 - 99 mg/dL   BUN 12  6 - 23 mg/dL   Creatinine, Ser 6.29 (*) 0.50 - 1.10 mg/dL   Calcium 9.5  8.4 - 52.8 mg/dL   Total Protein 7.5  6.0 - 8.3 g/dL   Albumin 3.7  3.5 - 5.2 g/dL   AST 26  0 - 37 U/L   ALT  20  0 - 35 U/L   Alkaline Phosphatase 60  39 - 117 U/L   Total Bilirubin 1.3 (*) 0.3 - 1.2 mg/dL   GFR calc non Af Amer >90  >90 mL/min   GFR calc Af Amer >90  >90 mL/min  TROPONIN I      Result Value Range   Troponin I <0.30  <0.30 ng/mL  GLUCOSE, CAPILLARY      Result Value Range   Glucose-Capillary 102 (*) 70 - 99 mg/dL  POCT I-STAT, CHEM 8      Result Value Range   Sodium 140  135 - 145 mEq/L   Potassium 4.0  3.5 - 5.1 mEq/L   Chloride 101  96 - 112 mEq/L   BUN 11  6 - 23 mg/dL   Creatinine, Ser 1.61  0.50 - 1.10 mg/dL   Glucose, Bld 096 (*) 70 - 99 mg/dL   Calcium, Ion 0.45  4.09 - 1.30 mmol/L   TCO2 26  0 - 100 mmol/L   Hemoglobin 14.3  12.0 - 15.0 g/dL   HCT 81.1  91.4 - 78.2 %    Imaging Review Ct Head (brain) Wo Contrast  11/25/2013   CLINICAL DATA:  79 year old female code stroke with right-sided symptoms. Initial encounter.  EXAM: CT HEAD WITHOUT CONTRAST  TECHNIQUE: Contiguous axial images were obtained from the base of the skull through the vertex without intravenous contrast.  COMPARISON:  08/30/2007.  FINDINGS: Visualized paranasal sinuses and mastoids are clear. No acute osseous abnormality identified. No acute orbit or scalp soft tissue findings.  Calcified atherosclerosis at the skull base. No  ventriculomegaly. Stable right frontal lobe sulcal variation (series 2, image 19). Less likely, this could reflect a small chronic arachnoid cyst. No acute intracranial hemorrhage identified. No evidence of cortically based acute infarction identified. Density of major intracranial vascular structures appear stable. Stable and normal gray-white matter differentiation.  IMPRESSION: No acute cortically based infarct or intracranial hemorrhage. Stable non contrast CT appearance of the brain since 2008.  Study discussed by telephone with Dr. Cyril Mourning on 11/25/2013 at 12:42 .   Electronically Signed   By: Augusto Gamble M.D.   On: 11/25/2013 12:43    EKG Interpretation    Date/Time:  Friday November 25 2013 12:18:27 EST Ventricular Rate:  46 PR Interval:  185 QRS Duration: 102 QT Interval:  471 QTC Calculation: 412 R Axis:   -94 Text Interpretation:  Sinus bradycardia Left anterior fascicular block Abnormal R-wave progression, late transition Borderline T abnormalities, lateral leads Sinus bradycardia (less than 50 BPm) No significant change since last tracing Confirmed by Orson Rho  MD, Khaliel Morey ([id number]) on 11/25/2013 12:27:36 PM           CRITICAL CARE Performed by: Shelda Jakes. Total critical care time: 30 Critical care time was exclusive of separately billable procedures and treating other patients. Critical care was necessary to treat or prevent imminent or life-threatening deterioration. Critical care was time spent personally by me on the following activities: development of treatment plan with patient and/or surrogate as well as nursing, discussions with consultants, evaluation of patient's response to treatment, examination of patient, obtaining history from patient or surrogate, ordering and performing treatments and interventions, ordering and review of laboratory studies, ordering and review of radiographic studies, pulse oximetry and re-evaluation of patient's condition.   MDM   1.  CVA (cerebral infarction)   2. Adjustment disorder with anxiety   3. Coronary atherosclerosis of unspecified type of vessel, native or graft  4. Other and unspecified hyperlipidemia   5. Palpitations   6. Unspecified essential hypertension   7. Unspecified hypothyroidism   8. TIA (transient ischemic attack)   9. Sinus bradycardia    Patient with acute onset of right leg weakness that was profound at home at around 11:15. Upon arrival here her right leg was still weak but did have the some movement in it. Code stroke was called. Patient was taken immediately to head CT. Head CT without any acute findings. Stroke team evaluated the patient after I did then noted that the right leg weakness was improving and there also was some left leg sensory deficit. Patient not a candidate for TPA this may be a CVA or a TIA depending. Resolved. Things are resolving rapidly. Patient will be admitted to the medicine service with neurology consultation. M A. and MRI ordered.       Shelda Jakes, MD 11/25/13 1326  Shelda Jakes, MD 11/25/13 [phone]

## 2013-11-25 NOTE — ED Notes (Signed)
Pt states thaT ALL S/S ARE GONE

## 2013-11-25 NOTE — H&P (Signed)
[facility] History and Physical  Joyce [name] WUJ:[id number] DOB: [birth year] DOA: 11/25/2013  Referring physician: PCP: Carrie Mew, MD  Specialists:   Chief Complaint: Right arm and leg weakness  HPI: Joyce [name] is a 79 y.o. female  With a history of hyperlipidemia, hypothyroidism, coronary disease, hypertension and presents emergency department for primarily right arm and leg weakness. Patient states approximately 11:15 this morning she was sitting at her computer at which point she was trying to get up from the chair and noticed her leg would not move. She felt that her leg was just stuck. She was admitted she felt some numbness in her right leg as well. Patient denies this ever occurring previously. She was originally brought to the emergency department via EMS. It was thought to have stroke. However symptoms have begun to improve. Currently her symptoms are still improving. Patient no longer feels right arm weakness she does continue to have right leg numbness and tingling. Patient was not given t-PA. Neurology was also consulted. Patient currently is not having any sensory deficits or speech problems. She's not feeling any dizziness or lightheadedness. CT of the head was conducted the emergency department show no acute cortically based infarct or intracranial hemorrhage.  Review of Systems:  Constitutional: Denies fever, chills, diaphoresis, appetite change and fatigue.  HEENT: Denies photophobia, eye pain, redness, hearing loss, ear pain, congestion, sore throat, rhinorrhea, sneezing, mouth sores, trouble swallowing, neck pain, neck stiffness and tinnitus.   Respiratory: Denies SOB, DOE, cough, chest tightness,  and wheezing.   Cardiovascular: Denies chest pain, palpitations and leg swelling.  Gastrointestinal: Denies nausea, vomiting, abdominal pain, diarrhea, constipation, blood in stool and abdominal distention.  Genitourinary: Denies dysuria, urgency, frequency,  hematuria, flank pain and difficulty urinating.  Musculoskeletal: Denies myalgias, back pain, joint swelling, arthralgias and gait problem.  Skin: Denies pallor, rash and wound.  Neurological: Denies dizziness, seizures, syncope, weakness, light-headedness,  headaches.  admits to right lower extremity and right arm numbness. Hematological: Denies adenopathy. Easy bruising, personal or family bleeding history  Psychiatric/Behavioral: Denies suicidal ideation, mood changes, confusion, nervousness, sleep disturbance and agitation  Past Medical History  Diagnosis Date  . Unspecified essential hypertension   . Hyperlipidemia   . GERD (gastroesophageal reflux disease)   . Candidiasis of skin and nails   . Internal hemorrhoids without mention of complication   . Hypothyroidism   . IBS (irritable bowel syndrome)   . Coronary atherosclerosis of unspecified type of vessel, native or graft CARDIOLOGIST- [facility]    DENIES S & S  . Borderline glaucoma   . Adjustment disorder with anxiety STRESS CHEST DISCOMFORT    PT RECENTLY PUT HUSBAND IN NURSING HOME  . History of sleep apnea YRS AGO ON CPAP UNTIL LOST WT  . Pinched nerve LUMBAR    RESIDUAL LEFT FOOT NUMBNESS/ TINGLING  . Scoliosis   . Numbness and tingling of foot LEFT -- SECONDARY TO PINCHED LUMBAR NERVE  . Nocturia   . Stress incontinence   . Lesion of bladder    Past Surgical History  Procedure Laterality Date  . Vaginal hysterectomy  1968  . Cholecystectomy  1976  . Cataract extraction w/ intraocular lens  implant, bilateral    . Cardiac catheterization  01-10-2003   DR Chrissie Noa DOWNEY    PATENT CIRCUMFLEX STENT/ MODERATE CAD ELSEWHERE  . Coronary angioplasty with stent placement  08-31-2001   DR BRUCE BRODIE    STENTING OF PROXIMAL CIRCUMFLEX (75% TO 0%)   . Cardiovascular  stress test  07-27-2012    NORMAL NUCLEAR STUDY/ LVEF 81%  . Cystoscopy with biopsy  08/02/2012    Procedure: CYSTOSCOPY WITH BIOPSY;  Surgeon: Milford Cage, MD;  Location: [facility];  Service: Urology;  Laterality: N/A;   Social History:  reports that she quit smoking about 30 years ago. Her smoking use included Cigarettes. She has a 30 pack-year smoking history. She has never used smokeless tobacco. She reports that she does not drink alcohol or use illicit drugs. Lives at home with her husband who has Alzheimer's dementia.  Allergies  Allergen Reactions  . Loratadine Other (See Comments)    Gi upset  . Adhesive [Tape] Other (See Comments)    Bruising and severe irritation  . Aspirin Other (See Comments)    Gi upset  . Levaquin [Levofloxacin In D5w] Itching  . Levofloxacin Nausea And Vomiting  . Lumigan [Bimatoprost] Other (See Comments)    Severe burning of eyes  . Mobic [Meloxicam] Nausea And Vomiting and Other (See Comments)    hallucinations  . Penicillins Diarrhea and Nausea And Vomiting  . Statins Other (See Comments)    Severe muscle pain    Family History  Problem Relation Age of Onset  . Heart disease Mother   . Cancer Mother     Prior to Admission medications   Medication Sig Start Date End Date Taking? Authorizing Provider  amLODipine (NORVASC) 2.5 MG tablet Take 1 tablet (2.5 mg total) by mouth daily. 04/13/13   Tonny Bollman, MD  atenolol (TENORMIN) 25 MG tablet Take 1 tablet (25 mg total) by mouth 2 (two) times daily. 07/22/12 07/22/13  Beatrice Lecher, PA-C  Biotin (APPEAREX) 2.5 MG TABS Take 2.5 mg by mouth daily.    Historical Provider, MD  doxycycline (VIBRA-TABS) 100 MG tablet Take 1 tablet (100 mg total) by mouth 2 (two) times daily. 09/29/13   Doe-Hyun R Artist Pais, DO  esomeprazole (NEXIUM) 40 MG capsule Take 1 capsule (40 mg total) by mouth every other day. 04/29/13   Stacie Glaze, MD  fish oil-omega-3 fatty acids 1000 MG capsule Take 1 g by mouth 3 (three) times daily.    Historical Provider, MD  hyoscyamine (LEVSIN, ANASPAZ) 0.125 MG tablet Take 0.125 mg by mouth every 4 (four) hours as  needed. 08/02/12   Milford Cage, MD  Lactobacillus (ACIDOPHILUS) 10 MG CAPS Take 1 capsule by mouth daily.     Historical Provider, MD  LORazepam (ATIVAN) 0.5 MG tablet TAKE ONE TABLET BY MOUTH EVERY 8 HOURS AS NEEDED FOR ANXIETY 11/22/13   Stacie Glaze, MD  niacinamide 100 MG tablet Take 1000 units twice daily    Historical Provider, MD  nitroGLYCERIN (NITROSTAT) 0.4 MG SL tablet Place 1 tablet (0.4 mg total) under the tongue every 5 (five) minutes as needed for chest pain. 09/29/13   Doe-Hyun R Artist Pais, DO  oxybutynin (DITROPAN) 5 MG tablet Take 5 mg by mouth 2 (two) times daily.     Historical Provider, MD  Probiotic Product (ALIGN PO) Take 1 capsule by mouth daily.    Historical Provider, MD  pyridOXINE (B-6) 50 MG tablet Take 50 mg by mouth daily.    Historical Provider, MD  SYNTHROID 125 MCG tablet TAKE ONE TABLET BY MOUTH ONCE DAILY. 09/26/13   Stacie Glaze, MD  vitamin B-12 (CYANOCOBALAMIN) 500 MCG tablet Take 500 mcg by mouth daily.     Historical Provider, MD  vitamin E 400 UNIT capsule Take 400  Units by mouth daily.    Historical Provider, MD   Physical Exam: Filed Vitals:   11/25/13 1408  BP: 163/73  Pulse: 55  Temp:   Resp: 13     General: Well developed, well nourished, NAD, appears stated age  HEENT: NCAT, PERRLA, EOMI, Anicteic Sclera, mucous membranes moist. No pharyngeal erythema or exudates  Neck: Supple, no JVD, no masses  Cardiovascular: S1 S2 auscultated, bradycardic, no rubs, murmurs or gallops.   Respiratory: Clear to auscultation bilaterally with equal chest rise  Abdomen: Soft, nontender, nondistended, + bowel sounds  Extremities: warm dry without cyanosis clubbing or edema  Neuro: AAOx3, cranial nerves grossly intact. Strength 5/5 in patient's RUE, RLE, LUE.  4/5 LLE.  Skin: Without rashes exudates or nodules  Psych: Normal affect and demeanor with intact judgement and insight  Labs on Admission:  Basic Metabolic Panel:  Recent Labs Lab  11/25/13 1255 11/25/13 1303  NA 139 140  K 4.0 4.0  CL 101 101  CO2 28  --   GLUCOSE 105* 104*  BUN 12 11  CREATININE 0.48* 0.80  CALCIUM 9.5  --    Liver Function Tests:  Recent Labs Lab 11/25/13 1255  AST 26  ALT 20  ALKPHOS 60  BILITOT 1.3*  PROT 7.5  ALBUMIN 3.7   No results found for this basename: LIPASE, AMYLASE,  in the last 168 hours No results found for this basename: AMMONIA,  in the last 168 hours CBC:  Recent Labs Lab 11/25/13 1255 11/25/13 1303  WBC 5.5  --   NEUTROABS 3.3  --   HGB 14.6 14.3  HCT 41.0 42.0  MCV 85.2  --   PLT 192  --    Cardiac Enzymes:  Recent Labs Lab 11/25/13 1255  TROPONINI <0.30    BNP (last 3 results) No results found for this basename: PROBNP,  in the last 8760 hours CBG:  Recent Labs Lab 11/25/13 1224  GLUCAP 102*    Radiological Exams on Admission: Ct Head (brain) Wo Contrast  11/25/2013   CLINICAL DATA:  79 year old female code stroke with right-sided symptoms. Initial encounter.  EXAM: CT HEAD WITHOUT CONTRAST  TECHNIQUE: Contiguous axial images were obtained from the base of the skull through the vertex without intravenous contrast.  COMPARISON:  08/30/2007.  FINDINGS: Visualized paranasal sinuses and mastoids are clear. No acute osseous abnormality identified. No acute orbit or scalp soft tissue findings.  Calcified atherosclerosis at the skull base. No ventriculomegaly. Stable right frontal lobe sulcal variation (series 2, image 19). Less likely, this could reflect a small chronic arachnoid cyst. No acute intracranial hemorrhage identified. No evidence of cortically based acute infarction identified. Density of major intracranial vascular structures appear stable. Stable and normal gray-white matter differentiation.  IMPRESSION: No acute cortically based infarct or intracranial hemorrhage. Stable non contrast CT appearance of the brain since 2008.  Study discussed by telephone with Dr. Cyril Mourning on 11/25/2013 at  12:42 .   Electronically Signed   By: Augusto Gamble M.D.   On: 11/25/2013 12:43    EKG: Independently reviewed. Sinus bradycardia, rate 46  Assessment/Plan Active Problems:   HYPOTHYROIDISM   HYPERLIPIDEMIA   HYPERTENSION, MODERATE   CORONARY ARTERY DISEASE   GERD   Lumbar pain with radiation down left leg   CVA (cerebral infarction)   TIA (transient ischemic attack)  Right-sided paresthesia, likely TIA, rule out stroke Patient be admitted to neuro telemetry floor. Continue to monitor her with neurochecks every 4 hours. Neurology is also  been consultative: The patient. Patient is pending an MRI MRA, carotid Doppler, echocardiogram. Will continue her on telemetry to monitor for any tachycardia or bradycardia arrhythmias. Patient is currently bradycardic; her atenolol was discontinued in August. Patient has been noted to have a low heart rate.  Will also obtain a TSH level. Will also obtain PT and OT consult. Will check hemoglobin A1c as well as a fasting lipid panel. Will also start patient on aspirin for preventative measures.  Left lower extremity weakness Secondary to lumbar pain. Patient does state she has a pinched nerve in her back.  Hypertension Will continue monitoring her blood pressure, and allow for permissive hypertension. Will continue her amlodipine 2.5 mg daily.  Hyperlipidemia Will obtain a fasting lipid panel.  Hypothyroidism Will continue levothyroxine. Will also obtain a TSH level.  GERD Will continue PPI  Coronary artery disease Currently chest pain-free will continue her home medications.  Anxiety Will continue lorazepam when necessary  DVT prophylaxis: Lovenox  Code Status: Full  Condition: Guarded  Family Communication: Daughter at bedside. Admission, patients condition and plan of care including tests being ordered have been discussed with the patient and daughter who indicate understanding and agree with the plan and Code Status.  Disposition Plan:  Observation  Time spent: 45 minutes  Joyce [name] D.O. [facility] Pager [phone]  If 7PM-7AM, please contact night-coverage [email or web address] Password [facility] 11/25/2013, 2:34 PM

## 2013-11-25 NOTE — Progress Notes (Signed)
Patient arrived to room via stretcher from ED. Report received from [facility] (ER RN). Per ER nurse patient has been eating since patient she passed her swallow test. Safety precautions reviewed with patient. Daughter is at bedside. Will continue to monitor.   Sim Boast, RN

## 2013-11-25 NOTE — ED Notes (Signed)
EDP at bedside  

## 2013-11-26 DIAGNOSIS — E039 Hypothyroidism, unspecified: Secondary | ICD-10-CM

## 2013-11-26 DIAGNOSIS — G459 Transient cerebral ischemic attack, unspecified: Secondary | ICD-10-CM

## 2013-11-26 DIAGNOSIS — R29898 Other symptoms and signs involving the musculoskeletal system: Secondary | ICD-10-CM | POA: Diagnosis present

## 2013-11-26 DIAGNOSIS — I2510 Atherosclerotic heart disease of native coronary artery without angina pectoris: Secondary | ICD-10-CM | POA: Diagnosis present

## 2013-11-26 DIAGNOSIS — E785 Hyperlipidemia, unspecified: Secondary | ICD-10-CM

## 2013-11-26 DIAGNOSIS — B951 Streptococcus, group B, as the cause of diseases classified elsewhere: Secondary | ICD-10-CM | POA: Diagnosis present

## 2013-11-26 DIAGNOSIS — F411 Generalized anxiety disorder: Secondary | ICD-10-CM | POA: Diagnosis present

## 2013-11-26 DIAGNOSIS — I10 Essential (primary) hypertension: Secondary | ICD-10-CM

## 2013-11-26 DIAGNOSIS — I6350 Cerebral infarction due to unspecified occlusion or stenosis of unspecified cerebral artery: Secondary | ICD-10-CM

## 2013-11-26 LAB — URINALYSIS, ROUTINE W REFLEX MICROSCOPIC
Bilirubin Urine: NEGATIVE
Glucose, UA: NEGATIVE mg/dL
Nitrite: NEGATIVE
Specific Gravity, Urine: 1.007 (ref 1.005–1.030)
pH: 7.5 (ref 5.0–8.0)

## 2013-11-26 LAB — LIPID PANEL
HDL: 55 mg/dL (ref >39)
LDL Cholesterol: 135 mg/dL — ABNORMAL HIGH (ref 0–99)
Total CHOL/HDL Ratio: 3.7 RATIO
VLDL: 13 mg/dL (ref 0–40)

## 2013-11-26 LAB — URINE MICROSCOPIC-ADD ON

## 2013-11-26 LAB — GLUCOSE, CAPILLARY: Glucose-Capillary: 90 mg/dL (ref 70–99)

## 2013-11-26 MED ORDER — DEXTROSE 5 % IV SOLN
1.0000 g | INTRAVENOUS | Status: DC
  Administered 2013-11-26: 1 g via INTRAVENOUS
  Filled 2013-11-26 (×2): qty 10

## 2013-11-26 NOTE — Progress Notes (Addendum)
Stroke Team Progress Note  HISTORY 79 y.o. female who was sitting at her computer this am when suddenly she noted right arm and leg weakness and paresthesia. Patient was brought by EMS and code stroke was called while in the ED. CT head was obtained showing no abnormal findings. On exam after CT she was complaining of decreased sensation of LEFT arm and leg decreased sensation to PP but not LT. She has known decreased sensation to left leg prior due to nerve impingement. No other focal findings on exam. tPA was not given due to minimal non disabling symptoms.    Patient was not a TPA candidate secondary to improving symptoms  SUBJECTIVE Neurologic exam improved and pt is closer to her baseline.   OBJECTIVE Most recent Vital Signs: Filed Vitals:   11/25/13 1900 11/25/13 2121 11/26/13 0512 11/26/13 1001  BP: 155/41 165/44 143/44 123/36  Pulse: 55 50 57 59  Temp: 98.1 F (36.7 C) 97.5 F (36.4 C) 98.1 F (36.7 C) 98.7 F (37.1 C)  TempSrc: Oral Oral Oral Oral  Resp: 18 17 17 18   Height:      Weight:      SpO2: 97% 98% 96% 97%   CBG (last 3)   Recent Labs  11/25/13 1224 11/25/13 2124 11/26/13 0647  GLUCAP 102* 98 85    IV Fluid Intake:     MEDICATIONS  . amLODipine  2.5 mg Oral Daily  . aspirin EC  81 mg Oral Daily  . levothyroxine  125 mcg Oral QAC breakfast  . oxybutynin  5 mg Oral BID  . POINT study - Placebo / clopidogrel (daily dose)  (PI-Sethi)  75 mg Oral Q breakfast  . pyridOXINE  50 mg Oral Daily   PRN:  LORazepam, nitroGLYCERIN  Diet:  Cardiac  Activity:  Up with assistance DVT Prophylaxis:  SCDs  CLINICALLY SIGNIFICANT STUDIES Basic Metabolic Panel:  Recent Labs Lab 11/25/13 1255 11/25/13 1303 11/25/13 1815  NA 139 140  --   K 4.0 4.0  --   CL 101 101  --   CO2 28  --   --   GLUCOSE 105* 104*  --   BUN 12 11  --   CREATININE 0.48* 0.80 0.52  CALCIUM 9.5  --   --    Liver Function Tests:  Recent Labs Lab 11/25/13 1255  AST 26  ALT 20   ALKPHOS 60  BILITOT 1.3*  PROT 7.5  ALBUMIN 3.7   CBC:  Recent Labs Lab 11/25/13 1255 11/25/13 1303 11/25/13 1815  WBC 5.5  --  5.7  NEUTROABS 3.3  --   --   HGB 14.6 14.3 14.6  HCT 41.0 42.0 41.1  MCV 85.2  --  84.9  PLT 192  --  194   Coagulation:  Recent Labs Lab 11/25/13 1255  LABPROT 13.4  INR 1.04   Cardiac Enzymes:  Recent Labs Lab 11/25/13 1255  TROPONINI <0.30   Urinalysis: No results found for this basename: COLORURINE, APPERANCEUR, LABSPEC, PHURINE, GLUCOSEU, HGBUR, BILIRUBINUR, KETONESUR, PROTEINUR, UROBILINOGEN, NITRITE, LEUKOCYTESUR,  in the last 168 hours Lipid Panel    Component Value Date/Time   CHOL 203* 11/26/2013 0443   TRIG 67 11/26/2013 0443   HDL 55 11/26/2013 0443   CHOLHDL 3.7 11/26/2013 0443   VLDL 13 11/26/2013 0443   LDLCALC 135* 11/26/2013 0443   HgbA1C  Lab Results  Component Value Date   HGBA1C 5.6 11/25/2013    Urine Drug Screen:     Component  Value Date/Time   LABOPIA NONE DETECTED 11/25/2013 2200   COCAINSCRNUR NONE DETECTED 11/25/2013 2200   LABBENZ NONE DETECTED 11/25/2013 2200   AMPHETMU NONE DETECTED 11/25/2013 2200   THCU NONE DETECTED 11/25/2013 2200   LABBARB NONE DETECTED 11/25/2013 2200    Alcohol Level: No results found for this basename: ETH,  in the last 168 hours  Ct Head (brain) Wo Contrast  11/25/2013   CLINICAL DATA:  79 year old female code stroke with right-sided symptoms. Initial encounter.  EXAM: CT HEAD WITHOUT CONTRAST  TECHNIQUE: Contiguous axial images were obtained from the base of the skull through the vertex without intravenous contrast.  COMPARISON:  08/30/2007.  FINDINGS: Visualized paranasal sinuses and mastoids are clear. No acute osseous abnormality identified. No acute orbit or scalp soft tissue findings.  Calcified atherosclerosis at the skull base. No ventriculomegaly. Stable right frontal lobe sulcal variation (series 2, image 19). Less likely, this could reflect a small chronic arachnoid cyst. No  acute intracranial hemorrhage identified. No evidence of cortically based acute infarction identified. Density of major intracranial vascular structures appear stable. Stable and normal gray-white matter differentiation.  IMPRESSION: No acute cortically based infarct or intracranial hemorrhage. Stable non contrast CT appearance of the brain since 2008.  Study discussed by telephone with Dr. Cyril Mourning on 11/25/2013 at 12:42 .   Electronically Signed   By: Augusto Gamble M.D.   On: 11/25/2013 12:43   Mr Angiogram Head Wo Contrast  11/25/2013   CLINICAL DATA:  Hypertensive hyperlipidemia patient presenting with right arm and leg weakness.  EXAM: MRI HEAD WITHOUT CONTRAST  MRA HEAD WITHOUT CONTRAST  TECHNIQUE: Multiplanar, multiecho pulse sequences of the brain and surrounding structures were obtained without intravenous contrast. Angiographic images of the head were obtained using MRA technique without contrast.  COMPARISON:  11/25/2013 head CT.  No comparison brain MR.  FINDINGS: MRI HEAD FINDINGS  Acute small nonhemorrhagic infarct at the junction of the right frontal -parietal lobe.  Subacute small infarct extends from the posterior left lenticular nucleus to posterior left coronal radiata.  Small vessel disease type changes with T2 shine through medial right parietal-occipital lobe junction on diffusion sequence.  No intracranial hemorrhage.  Global atrophy without hydrocephalus.  No intracranial mass lesion noted on this unenhanced exam.  Major intracranial vascular structures are patent.  Cervical medullary junction, pituitary region, pineal region and orbital structures unremarkable.  Major intracranial vascular structures are patent.  MRA HEAD FINDINGS  Anterior circulation without medium or large size vessel significant stenosis or occlusion.  Ectatic patent vertebral arteries with left vertebral artery dominant in size. Mild narrowing of the distal right vertebral artery after takeoff of the right posterior  inferior cerebellar artery. Non visualization left posterior inferior cerebellar artery.  Ectatic vertebral artery without high-grade stenosis.  Non visualization anterior inferior cerebellar arteries.  Mild irregularity and narrowing of the superior cerebellar arteries.  Tiny aneurysm superior aspect of the right internal carotid artery cavernous segment suspected.  IMPRESSION: Acute small nonhemorrhagic infarct at the junction of the right frontal -parietal lobe.  Tiny aneurysm superior aspect of the right internal carotid artery cavernous segment suspected.  Please see above.   Electronically Signed   By: Bridgett Larsson M.D.   On: 11/25/2013 16:14   Mr Brain Wo Contrast  11/25/2013   CLINICAL DATA:  Hypertensive hyperlipidemia patient presenting with right arm and leg weakness.  EXAM: MRI HEAD WITHOUT CONTRAST  MRA HEAD WITHOUT CONTRAST  TECHNIQUE: Multiplanar, multiecho pulse sequences of the brain and surrounding  structures were obtained without intravenous contrast. Angiographic images of the head were obtained using MRA technique without contrast.  COMPARISON:  11/25/2013 head CT.  No comparison brain MR.  FINDINGS: MRI HEAD FINDINGS  Acute small nonhemorrhagic infarct at the junction of the right frontal -parietal lobe.  Subacute small infarct extends from the posterior left lenticular nucleus to posterior left coronal radiata.  Small vessel disease type changes with T2 shine through medial right parietal-occipital lobe junction on diffusion sequence.  No intracranial hemorrhage.  Global atrophy without hydrocephalus.  No intracranial mass lesion noted on this unenhanced exam.  Major intracranial vascular structures are patent.  Cervical medullary junction, pituitary region, pineal region and orbital structures unremarkable.  Major intracranial vascular structures are patent.  MRA HEAD FINDINGS  Anterior circulation without medium or large size vessel significant stenosis or occlusion.  Ectatic patent  vertebral arteries with left vertebral artery dominant in size. Mild narrowing of the distal right vertebral artery after takeoff of the right posterior inferior cerebellar artery. Non visualization left posterior inferior cerebellar artery.  Ectatic vertebral artery without high-grade stenosis.  Non visualization anterior inferior cerebellar arteries.  Mild irregularity and narrowing of the superior cerebellar arteries.  Tiny aneurysm superior aspect of the right internal carotid artery cavernous segment suspected.  IMPRESSION: Acute small nonhemorrhagic infarct at the junction of the right frontal -parietal lobe.  Tiny aneurysm superior aspect of the right internal carotid artery cavernous segment suspected.  Please see above.   Electronically Signed   By: Bridgett Larsson M.D.   On: 11/25/2013 16:14    CT of the brain  No acute cortically based infarct or intracranial hemorrhage. Stable  non contrast CT appearance of the brain since 2008.  MRI of the brain  Acute small nonhemorrhagic infarct at the junction of the right  frontal -parietal lobe.  MRA of the brain    2D Echocardiogram  pending  Carotid Doppler   pending CXR    EKG sinus  Therapy Recommendations pending  Physical Exam  Mental Status:  Alert, oriented, thought content appropriate. Speech fluent without evidence of aphasia. Able to follow 3 step commands without difficulty.  Cranial Nerves:  II: Discs flat bilaterally; Visual fields grossly normal, pupils equal, round, reactive to light and accommodation  III,IV, VI: ptosis not present, extra-ocular motions intact bilaterally  V,VII: smile symmetric, facial light touch sensation normal bilaterally  VIII: hearing normal bilaterally  IX,X: gag reflex present  XI: bilateral shoulder shrug  XII: midline tongue extension without atrophy or fasciculations  Motor:  Right : Upper extremity 5/5 Left: Upper extremity 4+/5  Lower extremity 5/5 Lower extremity 5/5  Tone and  bulk:normal tone throughout; no atrophy noted  Sensory: Pinprick decreased on the left arm and leg but light touch intact throughout, bilaterally  Deep Tendon Reflexes:  Right: Upper Extremity Left: Upper extremity  biceps (C-5 to C-6) 2/4 biceps (C-5 to C-6) 2/4  tricep (C7) 2/4 triceps (C7) 2/4  Brachioradialis (C6) 2/4 Brachioradialis (C6) 2/4  Lower Extremity  1+ bilaterally  Plantars:  Right: downgoing Left: downgoing  Cerebellar:  normal finger-to-nose, normal heel-to-shin test  Gait: not examined due to acuity.  CV: pulses palpable throughout    ASSESSMENT 79 y.o. female with transient right arm and leg weakness and paresthesia which has resolved but now complaining of left arm and leg decreased sensation (leg symptoms are old). Suspect incidental finding of stroke R fronto parietal region with TIA this time    r fronto parietal infarct.  Hospital day # 1  TREATMENT/PLAN  Echo/carotid/Pt and then can be d/c  She is in [place] trial, pharmacy will do all dosing   Pharmacy will have Point trial medications on second floor prior to d/c  Pauletta Browns  11/26/2013 11:38 AM

## 2013-11-26 NOTE — Progress Notes (Signed)
*  PRELIMINARY RESULTS* Vascular Ultrasound Carotid Duplex (Doppler) has been completed.  Preliminary findings: Bilateral:  1-39% ICA stenosis.  Vertebral artery flow is antegrade.      Farrel Demark, RDMS, RVT  11/26/2013, 12:09 PM

## 2013-11-26 NOTE — Progress Notes (Signed)
[facility] PROGRESS NOTE  Joyce [name]:[id number] DOB: [birth year] DOA: 11/25/2013 PCP: Carrie Mew, MD  Assessment/Plan: 1. Right frontoparietal infarct- MRI brain shows right frontal parietal infarct, but patient also has weakness on the right side. As per neurology patient has incidental finding of this infarct and she has come with a TIA this time. Patient is in point trial, and pharmacy will do the dosing. Patient needs to pick up the medications for the trial from second floor pharmacy prior to discharge. At this time 2-D echo and carotid Doppler are pending. 2. ? UTI- patient has abnormal UA, will obtain urine culture and start the patient empirically on Rocephin 1 g IV q. 24 hours. 3. Hypertension- patient is currently on amlodipine 2.5 mg by mouth daily. We'll discontinue routine and allow for permissive hypertension. 4. Hypothyroidism- continue Synthroid. 5. DVT prophylaxis- SCDs  Code Status: *Full code Family Communication: *discussed with daughter at bedside Disposition Plan: Home when stable   Consultants:  Neurology  Procedures:  Echo  Carotid doppler  Antibiotics:  *Rocephin  HPI/Subjective: 79 year old female with history of hyperlipidemia, hypothyroidism, CAD, hypertension came to the hospital with right arm and leg weakness. Patient was admitted for stroke workup. MRI of the brain showed stroke in right frontal parietal region. The patient also complains of dysuria and has abnormal UA  Objective: Filed Vitals:   11/26/13 1001  BP: 123/36  Pulse: 59  Temp: 98.7 F (37.1 C)  Resp: 18    Intake/Output Summary (Last 24 hours) at 11/26/13 1450 Last data filed at 11/26/13 1236  Gross per 24 hour  Intake    960 ml  Output      0 ml  Net    960 ml   Filed Weights   11/25/13 1725  Weight: 63.005 kg (138 lb 14.4 oz)    Exam:  Physical Exam: Head: Normocephalic, atraumatic.  Eyes: No signs of jaundice, EOMI Nose: Mucous membranes  dry.  Throat: Oropharynx nonerythematous, no exudate appreciated.  Neck: supple,No deformities, masses, or tenderness noted. Lungs: Normal respiratory effort. B/L Clear to auscultation, no crackles or wheezes.  Heart: Regular RR. S1 and S2 normal  Abdomen: BS normoactive. Soft, Nondistended, non-tender.  Extremities: No pretibial edema, no erythema Neuro- no focal deficits noted at this time Data Reviewed: Basic Metabolic Panel:  Recent Labs Lab 11/25/13 1255 11/25/13 1303 11/25/13 1815  NA 139 140  --   K 4.0 4.0  --   CL 101 101  --   CO2 28  --   --   GLUCOSE 105* 104*  --   BUN 12 11  --   CREATININE 0.48* 0.80 0.52  CALCIUM 9.5  --   --    Liver Function Tests:  Recent Labs Lab 11/25/13 1255  AST 26  ALT 20  ALKPHOS 60  BILITOT 1.3*  PROT 7.5  ALBUMIN 3.7   No results found for this basename: LIPASE, AMYLASE,  in the last 168 hours No results found for this basename: AMMONIA,  in the last 168 hours CBC:  Recent Labs Lab 11/25/13 1255 11/25/13 1303 11/25/13 1815  WBC 5.5  --  5.7  NEUTROABS 3.3  --   --   HGB 14.6 14.3 14.6  HCT 41.0 42.0 41.1  MCV 85.2  --  84.9  PLT 192  --  194   Cardiac Enzymes:  Recent Labs Lab 11/25/13 1255  TROPONINI <0.30   BNP (last 3 results) No results found for this basename: PROBNP,  in the last 8760 hours CBG:  Recent Labs Lab 11/25/13 1224 11/25/13 2124 11/26/13 0647 11/26/13 1135  GLUCAP 102* 98 85 90    No results found for this or any previous visit (from the past 240 hour(s)).   Studies: Ct Head (brain) Wo Contrast  11/25/2013   CLINICAL DATA:  79 year old female code stroke with right-sided symptoms. Initial encounter.  EXAM: CT HEAD WITHOUT CONTRAST  TECHNIQUE: Contiguous axial images were obtained from the base of the skull through the vertex without intravenous contrast.  COMPARISON:  08/30/2007.  FINDINGS: Visualized paranasal sinuses and mastoids are clear. No acute osseous abnormality  identified. No acute orbit or scalp soft tissue findings.  Calcified atherosclerosis at the skull base. No ventriculomegaly. Stable right frontal lobe sulcal variation (series 2, image 19). Less likely, this could reflect a small chronic arachnoid cyst. No acute intracranial hemorrhage identified. No evidence of cortically based acute infarction identified. Density of major intracranial vascular structures appear stable. Stable and normal gray-white matter differentiation.  IMPRESSION: No acute cortically based infarct or intracranial hemorrhage. Stable non contrast CT appearance of the brain since 2008.  Study discussed by telephone with Dr. Cyril Mourning on 11/25/2013 at 12:42 .   Electronically Signed   By: Augusto Gamble M.D.   On: 11/25/2013 12:43   Mr Angiogram Head Wo Contrast  11/25/2013   CLINICAL DATA:  Hypertensive hyperlipidemia patient presenting with right arm and leg weakness.  EXAM: MRI HEAD WITHOUT CONTRAST  MRA HEAD WITHOUT CONTRAST  TECHNIQUE: Multiplanar, multiecho pulse sequences of the brain and surrounding structures were obtained without intravenous contrast. Angiographic images of the head were obtained using MRA technique without contrast.  COMPARISON:  11/25/2013 head CT.  No comparison brain MR.  FINDINGS: MRI HEAD FINDINGS  Acute small nonhemorrhagic infarct at the junction of the right frontal -parietal lobe.  Subacute small infarct extends from the posterior left lenticular nucleus to posterior left coronal radiata.  Small vessel disease type changes with T2 shine through medial right parietal-occipital lobe junction on diffusion sequence.  No intracranial hemorrhage.  Global atrophy without hydrocephalus.  No intracranial mass lesion noted on this unenhanced exam.  Major intracranial vascular structures are patent.  Cervical medullary junction, pituitary region, pineal region and orbital structures unremarkable.  Major intracranial vascular structures are patent.  MRA HEAD FINDINGS  Anterior  circulation without medium or large size vessel significant stenosis or occlusion.  Ectatic patent vertebral arteries with left vertebral artery dominant in size. Mild narrowing of the distal right vertebral artery after takeoff of the right posterior inferior cerebellar artery. Non visualization left posterior inferior cerebellar artery.  Ectatic vertebral artery without high-grade stenosis.  Non visualization anterior inferior cerebellar arteries.  Mild irregularity and narrowing of the superior cerebellar arteries.  Tiny aneurysm superior aspect of the right internal carotid artery cavernous segment suspected.  IMPRESSION: Acute small nonhemorrhagic infarct at the junction of the right frontal -parietal lobe.  Tiny aneurysm superior aspect of the right internal carotid artery cavernous segment suspected.  Please see above.   Electronically Signed   By: Bridgett Larsson M.D.   On: 11/25/2013 16:14   Mr Brain Wo Contrast  11/25/2013   CLINICAL DATA:  Hypertensive hyperlipidemia patient presenting with right arm and leg weakness.  EXAM: MRI HEAD WITHOUT CONTRAST  MRA HEAD WITHOUT CONTRAST  TECHNIQUE: Multiplanar, multiecho pulse sequences of the brain and surrounding structures were obtained without intravenous contrast. Angiographic images of the head were obtained using MRA technique without contrast.  COMPARISON:  11/25/2013 head CT.  No comparison brain MR.  FINDINGS: MRI HEAD FINDINGS  Acute small nonhemorrhagic infarct at the junction of the right frontal -parietal lobe.  Subacute small infarct extends from the posterior left lenticular nucleus to posterior left coronal radiata.  Small vessel disease type changes with T2 shine through medial right parietal-occipital lobe junction on diffusion sequence.  No intracranial hemorrhage.  Global atrophy without hydrocephalus.  No intracranial mass lesion noted on this unenhanced exam.  Major intracranial vascular structures are patent.  Cervical medullary junction,  pituitary region, pineal region and orbital structures unremarkable.  Major intracranial vascular structures are patent.  MRA HEAD FINDINGS  Anterior circulation without medium or large size vessel significant stenosis or occlusion.  Ectatic patent vertebral arteries with left vertebral artery dominant in size. Mild narrowing of the distal right vertebral artery after takeoff of the right posterior inferior cerebellar artery. Non visualization left posterior inferior cerebellar artery.  Ectatic vertebral artery without high-grade stenosis.  Non visualization anterior inferior cerebellar arteries.  Mild irregularity and narrowing of the superior cerebellar arteries.  Tiny aneurysm superior aspect of the right internal carotid artery cavernous segment suspected.  IMPRESSION: Acute small nonhemorrhagic infarct at the junction of the right frontal -parietal lobe.  Tiny aneurysm superior aspect of the right internal carotid artery cavernous segment suspected.  Please see above.   Electronically Signed   By: Bridgett Larsson M.D.   On: 11/25/2013 16:14    Scheduled Meds: . amLODipine  2.5 mg Oral Daily  . aspirin EC  81 mg Oral Daily  . cefTRIAXone (ROCEPHIN)  IV  1 g Intravenous Q24H  . levothyroxine  125 mcg Oral QAC breakfast  . oxybutynin  5 mg Oral BID  . POINT study - Placebo / clopidogrel (daily dose)  (PI-Sethi)  75 mg Oral Q breakfast  . pyridOXINE  50 mg Oral Daily   Continuous Infusions:   Active Problems:   HYPOTHYROIDISM   HYPERLIPIDEMIA   HYPERTENSION, MODERATE   CORONARY ARTERY DISEASE   GERD   Lumbar pain with radiation down left leg   CVA (cerebral infarction)   TIA (transient ischemic attack)    Time spent: *25 minutes    [facility]  [facility] Pager [phone]*. If 7PM-7AM, please contact night-coverage at [email or web address], password [facility] 11/26/2013, 2:50 PM  LOS: 1 day

## 2013-11-26 NOTE — Progress Notes (Signed)
  Echocardiogram 2D Echocardiogram has been performed.  [place] 11/26/2013, 5:59 PM

## 2013-11-26 NOTE — Progress Notes (Signed)
MD [place] paged about DBP in 30s, pt asymptomatic. BP 145/37. No orders at this time

## 2013-11-26 NOTE — Progress Notes (Signed)
PT Cancellation Note  Patient Details Name: VETA DAMBROSIA MRN: [id number] DOB: [birth year]   Cancelled Treatment:    Reason Eval/Treat Not Completed: Patient at procedure or test/unavailable.  Echo just pulled up to room.  PT to check on pt tomorrow.     Rollene Rotunda Micahel Omlor, PT, DPT [phone]   11/26/2013, 4:56 PM

## 2013-11-26 NOTE — Progress Notes (Signed)
Utilization Review completed.  

## 2013-11-27 LAB — CBC
HCT: 39.4 % (ref 36.0–46.0)
MCHC: 34.8 g/dL (ref 30.0–36.0)
MCV: 86.6 fL (ref 78.0–100.0)
RBC: 4.55 MIL/uL (ref 3.87–5.11)
RDW: 13.5 % (ref 11.5–15.5)
WBC: 5.2 10*3/uL (ref 4.0–10.5)

## 2013-11-27 LAB — BASIC METABOLIC PANEL
BUN: 13 mg/dL (ref 6–23)
CO2: 26 mEq/L (ref 19–32)
Chloride: 102 mEq/L (ref 96–112)
Creatinine, Ser: 0.54 mg/dL (ref 0.50–1.10)
GFR calc Af Amer: >90 mL/min (ref >90)

## 2013-11-27 LAB — URINE CULTURE

## 2013-11-27 MED ORDER — ASPIRIN 81 MG PO TBEC: 81.0000 mg | DELAYED_RELEASE_TABLET | Freq: Every day | ORAL | Status: DC

## 2013-11-27 MED ORDER — CEPHALEXIN 500 MG PO CAPS: 500.0000 mg | ORAL_CAPSULE | Freq: Two times a day (BID) | ORAL | Status: AC

## 2013-11-27 MED ORDER — LEVOTHYROXINE SODIUM 125 MCG PO TABS: 125.0000 ug | ORAL_TABLET | Freq: Every day | ORAL | Status: DC

## 2013-11-27 MED ORDER — STUDY - INVESTIGATIONAL DRUG SIMPLE RECORD: 75.0000 mg | Freq: Every day | Status: DC

## 2013-11-27 MED ORDER — RANITIDINE HCL 150 MG PO TABS: 150.0000 mg | ORAL_TABLET | Freq: Two times a day (BID) | ORAL | Status: DC | PRN

## 2013-11-27 NOTE — Evaluation (Signed)
Physical Therapy Evaluation Patient Details Name: Joyce [name] MRN: [id number] DOB: 1931/06/09 Today's Date: 11/27/2013 Time: 4098-1191 PT Time Calculation (min): 38 min  PT Assessment / Plan / Recommendation History of Present Illness    79 y.o. female who was sitting at her computer this am when suddenly she noted right arm and leg weakness and paresthesia. Patient was brought by EMS and code stroke was called while in the ED. CT head was obtained showing no abnormal findings. On exam after CT she was complaining of decreased sensation of LEFT arm and leg decreased sensation to PP but not LT. She has known decreased sensation to left leg prior due to nerve impingement. No other focal findings on exam. tPA was not given due to minimal non disabling symptoms   Clinical Impression  Pt presents with no motor control or sensory deficits compared to baseline.  Does have residual vestibular weakness affecting ability to change positions with minimal impact on safety or independence.  Provided written HEP handout with habituation/gaze stability/vestibular stimulation exercise, pt able to return demonstrate.  Encouraged to resume previous activities and consider dance lessons per her functional goal.  No other needs, seek PT if vestibular symptoms return.    PT Assessment  Patent does not need any further PT services    Follow Up Recommendations  No PT follow up    Does the patient have the potential to tolerate intense rehabilitation      Barriers to Discharge        Equipment Recommendations  None recommended by PT    Recommendations for Other Services  (HEP for mild vestibular hypofunction; seek PT if not better)   Frequency      Precautions / Restrictions Precautions Precautions: None Restrictions Weight Bearing Restrictions: No   Pertinent Vitals/Pain no apparent distress       Mobility  Bed Mobility Bed Mobility: Right Sidelying to Sit;Left Sidelying to Sit Right  Sidelying to Sit: 6: Modified independent (Device/Increase time) Left Sidelying to Sit: 6: Modified independent (Device/Increase time) Details for Bed Mobility Assistance: performed sidelying for vestibular assessment, pt able to reposition with no physical assist and minimal dizziness;  instructed on brandt-daroff -- see exercises below Transfers Transfers: Sit to Stand;Stand to Sit Sit to Stand: 7: Independent Stand to Sit: 7: Independent Ambulation/Gait Ambulation/Gait Assistance: 7: Independent Ambulation Distance (Feet): 150 Feet Assistive device: None Ambulation/Gait Assistance Details: assessed functional balance see below Gait Pattern: Step-through pattern Stairs: No Wheelchair Mobility Wheelchair Mobility: No Modified Rankin (Stroke Patients Only) Pre-Morbid Rankin Score: No significant disability Modified Rankin: No significant disability    Exercises Other Exercises Other Exercises: brandt-daroff repeat 3-5x per session. perform 3-5 sessions per day; handout provided Other Exercises: seated x1 viewing near target vertical and horizontal repeat 10-20 rotations, do 3-5 sessions daily   PT Diagnosis:    PT Problem List:   PT Treatment Interventions:       PT Goals(Current goals can be found in the care plan section) Acute Rehab PT Goals Patient Stated Goal: return to volunteering/home; start dancing PT Goal Formulation: No goals set, d/c therapy  Visit Information  Last PT Received On: 11/27/13 Assistance Needed: +1       Prior Functioning  Home Living Family/patient expects to be discharged to:: Private residence Living Arrangements: Alone Available Help at Discharge: Family;Friend(s) Type of Home: House Home Access: Level entry Home Layout: One level Home Equipment: None Additional Comments: independent, voluteers at nursing home Prior Function Level of Independence: Independent Comments:  does not drive, uses SCAT Communication Communication: No  difficulties Dominant Hand: Right    Cognition  Cognition Arousal/Alertness: Awake/alert Behavior During Therapy: WFL for tasks assessed/performed Overall Cognitive Status: Within Functional Limits for tasks assessed    Extremity/Trunk Assessment Upper Extremity Assessment Upper Extremity Assessment: Overall WFL for tasks assessed Lower Extremity Assessment Lower Extremity Assessment: Overall WFL for tasks assessed Cervical / Trunk Assessment Cervical / Trunk Assessment:  (mild forward head)   Balance Balance Balance Assessed: Yes Standardized Balance Assessment Standardized Balance Assessment: Dynamic Gait Index;Vestibular Evaluation (mild vestibular hypofuction noted; neg BPPV, neg nystagmus) Dynamic Gait Index Level Surface: Normal Change in Gait Speed: Normal Gait with Horizontal Head Turns: Mild Impairment Gait with Vertical Head Turns: Normal Gait and Pivot Turn: Normal Step Over Obstacle: Normal Step Around Obstacles: Mild Impairment Steps: Mild Impairment Total Score: 21 High Level Balance High Level Balance Activites: Turns;Direction changes;Head turns High Level Balance Comments: mild dizziness and self-corrected gait changes with horizontal turn to RIGHT  End of Session PT - End of Session Activity Tolerance: Patient tolerated treatment well Patient left: in chair;with family/visitor present Nurse Communication: Mobility status  GP     Dennis Bast 11/27/2013, 11:07 AM

## 2013-11-27 NOTE — Progress Notes (Signed)
Pt heart rate on the tele monitor runs bet. 34-40's non sustained. Pt denies any pain or discomfort. NP Claiborne Billings was notified. No new orders given. Will continue to monitor.

## 2013-11-27 NOTE — Progress Notes (Signed)
Spoke to [employer] with Neurology who said neuro recommends patient to go home on ASA and the point trial medication. This was passed to Dr Sharl Ma

## 2013-11-27 NOTE — Progress Notes (Signed)
Stroke Team Progress Note  HISTORY 79 y.o. female who was sitting at her computer this am when suddenly she noted right arm and leg weakness and paresthesia. Patient was brought by EMS and code stroke was called while in the ED. CT head was obtained showing no abnormal findings. On exam after CT she was complaining of decreased sensation of LEFT arm and leg decreased sensation to PP but not LT. She has known decreased sensation to left leg prior due to nerve impingement. No other focal findings on exam. tPA was not given due to minimal non disabling symptoms.    Patient was not a TPA candidate secondary to improving symptoms  SUBJECTIVE Neurologic exam improved and pt is closer to her baseline.   OBJECTIVE Most recent Vital Signs: Filed Vitals:   11/26/13 1801 11/26/13 2100 11/27/13 0654 11/27/13 0949  BP: 145/37 169/53 132/58 127/51  Pulse: 57 53  64  Temp: 98 F (36.7 C) 98 F (36.7 C)  97.6 F (36.4 C)  TempSrc: Oral Oral  Oral  Resp: 18 18  18   Height:      Weight:      SpO2: 98% 99%  99%   CBG (last 3)   Recent Labs  11/25/13 2124 11/26/13 0647 11/26/13 1135  GLUCAP 98 85 90    IV Fluid Intake:     MEDICATIONS  . aspirin EC  81 mg Oral Daily  . cefTRIAXone (ROCEPHIN)  IV  1 g Intravenous Q24H  . levothyroxine  125 mcg Oral QAC breakfast  . oxybutynin  5 mg Oral BID  . POINT study - Placebo / clopidogrel (daily dose)  (PI-Sethi)  75 mg Oral Q breakfast  . pyridOXINE  50 mg Oral Daily   PRN:  LORazepam, nitroGLYCERIN  Diet:  Cardiac  Activity:  Up with assistance DVT Prophylaxis:  SCDs  CLINICALLY SIGNIFICANT STUDIES Basic Metabolic Panel:   Recent Labs Lab 11/25/13 1255 11/25/13 1303 11/25/13 1815 11/27/13 0205  NA 139 140  --  139  K 4.0 4.0  --  3.8  CL 101 101  --  102  CO2 28  --   --  26  GLUCOSE 105* 104*  --  92  BUN 12 11  --  13  CREATININE 0.48* 0.80 0.52 0.54  CALCIUM 9.5  --   --  9.2   Liver Function Tests:   Recent Labs Lab  11/25/13 1255  AST 26  ALT 20  ALKPHOS 60  BILITOT 1.3*  PROT 7.5  ALBUMIN 3.7   CBC:  Recent Labs Lab 11/25/13 1255  11/25/13 1815 11/27/13 0205  WBC 5.5  --  5.7 5.2  NEUTROABS 3.3  --   --   --   HGB 14.6  < > 14.6 13.7  HCT 41.0  < > 41.1 39.4  MCV 85.2  --  84.9 86.6  PLT 192  --  194 184  < > = values in this interval not displayed. Coagulation:   Recent Labs Lab 11/25/13 1255  LABPROT 13.4  INR 1.04   Cardiac Enzymes:   Recent Labs Lab 11/25/13 1255  TROPONINI <0.30   Urinalysis:   Recent Labs Lab 11/26/13 1321  COLORURINE YELLOW  LABSPEC 1.007  PHURINE 7.5  GLUCOSEU NEGATIVE  HGBUR SMALL*  BILIRUBINUR NEGATIVE  KETONESUR NEGATIVE  PROTEINUR NEGATIVE  UROBILINOGEN 0.2  NITRITE NEGATIVE  LEUKOCYTESUR LARGE*   Lipid Panel    Component Value Date/Time   CHOL 203* 11/26/2013 0443   TRIG  67 11/26/2013 0443   HDL 55 11/26/2013 0443   CHOLHDL 3.7 11/26/2013 0443   VLDL 13 11/26/2013 0443   LDLCALC 135* 11/26/2013 0443   HgbA1C  Lab Results  Component Value Date   HGBA1C 5.6 11/25/2013    Urine Drug Screen:     Component Value Date/Time   LABOPIA NONE DETECTED 11/25/2013 2200   COCAINSCRNUR NONE DETECTED 11/25/2013 2200   LABBENZ NONE DETECTED 11/25/2013 2200   AMPHETMU NONE DETECTED 11/25/2013 2200   THCU NONE DETECTED 11/25/2013 2200   LABBARB NONE DETECTED 11/25/2013 2200    Alcohol Level: No results found for this basename: ETH,  in the last 168 hours   CT of the brain  No acute cortically based infarct or intracranial hemorrhage. Stable  non contrast CT appearance of the brain since 2008.  MRI of the brain - Acute small nonhemorrhagic infarct at the junction of the right frontal -parietal lobe.  MRA of the brain - Tiny aneurysm superior aspect of the right internal carotid artery cavernous segment suspected.  2D Echocardiogram  pending  Carotid Doppler  Preliminary findings: Bilateral: 1-39% ICA stenosis. Vertebral artery flow is  antegrade.   CXR    EKG sinus  Therapy Recommendations pending  Physical Exam  Mental Status:  Alert, oriented, thought content appropriate. Speech fluent without evidence of aphasia. Able to follow 3 step commands without difficulty.  Cranial Nerves:  II: Discs flat bilaterally; Visual fields grossly normal, pupils equal, round, reactive to light and accommodation  III,IV, VI: ptosis not present, extra-ocular motions intact bilaterally  V,VII: smile symmetric, facial light touch sensation normal bilaterally  VIII: hearing normal bilaterally  IX,X: gag reflex present  XI: bilateral shoulder shrug  XII: midline tongue extension without atrophy or fasciculations  Motor:  Right : Upper extremity 5/5 Left: Upper extremity 4+/5  Lower extremity 5/5 Lower extremity 5/5  Tone and bulk:normal tone throughout; no atrophy noted  Sensory: Pinprick decreased on the left arm and leg but light touch intact throughout, bilaterally  Deep Tendon Reflexes:  Right: Upper Extremity Left: Upper extremity  biceps (C-5 to C-6) 2/4 biceps (C-5 to C-6) 2/4  tricep (C7) 2/4 triceps (C7) 2/4  Brachioradialis (C6) 2/4 Brachioradialis (C6) 2/4  Lower Extremity  1+ bilaterally  Plantars:  Right: downgoing Left: downgoing  Cerebellar:  normal finger-to-nose, normal heel-to-shin test  Gait: not examined due to acuity.  CV: pulses palpable throughout    ASSESSMENT 79 y.o. female with transient right arm and leg weakness and paresthesia which has resolved but now complaining of left arm and leg decreased sensation (leg symptoms are old). MRI - acute small nonhemorrhagic infarct at the junction of the right frontal -parietal lobe. No antithrombotic prior to admission. Now enrolled in the point trial.   Point trial participant.  Hyperlipidemia - cholesterol 203 LDL 135 - history of severe muscle pain with statins.  Bradycardia  Hypertension history  Coronary artery disease history  Obstructive sleep  apnea  Hospital day # 2  TREATMENT/PLAN  2-D echo pending  She is in [place] trial, pharmacy will do all dosing   Probable discharge today per [facility]   Followup Dr. Pearlean Brownie in 2 months  Delton See PA-C [facility] Pager [phone] 11/27/2013, 10:23 AM  Work up complete, pt to f/up with Dr. Pearlean Brownie as out pt.

## 2013-11-27 NOTE — Discharge Summary (Addendum)
Physician Discharge Summary  Joyce [name]:[id number] DOB: 01/18/1931 DOA: 11/25/2013  PCP: Carrie Mew, MD  Admit date: 11/25/2013 Discharge date: 11/27/2013  Time spent: *51 minutes  Recommendations for Outpatient Follow-up:  1. Followup neurology in 2 months 2. Follow up urine culture results  Discharge Diagnoses:  Active Problems:   HYPOTHYROIDISM   HYPERLIPIDEMIA   HYPERTENSION, MODERATE   CORONARY ARTERY DISEASE   GERD   Lumbar pain with radiation down left leg   CVA (cerebral infarction)   TIA (transient ischemic attack)   Discharge Condition: Stable*  Diet recommendation: *Low-fat diet  Filed Weights   11/25/13 1725  Weight: 63.005 kg (138 lb 14.4 oz)    History of present illness:  79 y.o. female  With a history of hyperlipidemia, hypothyroidism, coronary disease, hypertension and presents emergency department for primarily right arm and leg weakness. Patient states approximately 11:15 this morning she was sitting at her computer at which point she was trying to get up from the chair and noticed her leg would not move. She felt that her leg was just stuck. She was admitted she felt some numbness in her right leg as well. Patient denies this ever occurring previously. She was originally brought to the emergency department via EMS. It was thought to have stroke. However symptoms have begun to improve. Currently her symptoms are still improving. Patient no longer feels right arm weakness she does continue to have right leg numbness and tingling. Patient was not given t-PA. Neurology was also consulted. Patient currently is not having any sensory deficits or speech problems. She's not feeling any dizziness or lightheadedness. CT of the head was conducted the emergency department show no acute cortically based infarct or intracranial hemorrhage.   Hospital Course:  Right frontoparietal infarct- MRI brain shows right frontal parietal infarct, but patient also has  weakness on the right side. As per neurology patient has incidental finding of this infarct and she has come with a TIA this time. Patient is in point trial, and pharmacy will do the dosing. Patient needs to pick up the medications for the trial from second floor pharmacy prior to discharge. Patient will followup with neurology in 2 months time. Patient will be also discharged on aspirin 81 mg along with point  trial drug.    ? UTI- patient has abnormal UA, will obtain urine culture and start the patient empirically on Rocephin 1 g IV q. 24 hours. Patient has been afebrile white count was normal and your was not grossly abnormal so this time I'm going to discharge patient on Keflex 500 twice a day for 5 days. We'll follow the urine culture results and adjust medications accordingly  Hypertension- patient is currently on amlodipine 2.5 mg by mouth daily. Continue with antihypertensive medications  Hypothyroidism- continue Synthroid  Addendum- 11/28/2013 Patient's daughter called to check on the results of urine culture. Urine culture is growing 75,000 colonies of group B strep no sensitivities available as group B strep is pansensitive to penicillin vancomycin ampicillin. Patient was discharged on Keflex and that should cover this bacteria. Advised to continue taking Keflex, and if dysuria does not improve she should have repeat urine culture in one week.  Procedures: 2-D echo- Study Conclusions  Left ventricle: The cavity size was normal. Wall thickness was normal. Systolic function was vigorous. The estimated ejection fraction was in the range of 65% to 70%. Wall motion was normal; there were no regional wall motion abnormalities.  Carotid duplex-did not show significant carotid stenosis  Consultations:  Neurology  Discharge Exam: Filed Vitals:   11/27/13 0949  BP: 127/51  Pulse: 64  Temp: 97.6 F (36.4 C)  Resp: 18    General: *Appears in no acute distress Cardiovascular: *S1-S2  regular Respiratory: Clear bilaterally  Discharge Instructions  Discharge Orders   Future Appointments Provider Department Dept Phone   12/09/2013 10:30 AM Tonny Bollman, MD [facility] [place] [facility] [phone]   Future Orders Complete By Expires   Diet - low sodium heart healthy  As directed    Increase activity slowly  As directed        Medication List    STOP taking these medications       doxycycline 100 MG tablet  Commonly known as:  VIBRA-TABS      TAKE these medications       ACIDOPHILUS 10 MG Caps  Take 1 capsule by mouth daily.     ALIGN PO  Take 1 capsule by mouth daily.     amLODipine 5 MG tablet  Commonly known as:  NORVASC  Take 5 mg by mouth daily.     APPEAREX 2.5 MG Tabs  Generic drug:  Biotin  Take 2.5 mg by mouth daily.     aspirin 81 MG EC tablet  Take 1 tablet (81 mg total) by mouth daily.     atenolol 25 MG tablet  Commonly known as:  TENORMIN  Take 25 mg by mouth 2 (two) times daily.     cephALEXin 500 MG capsule  Commonly known as:  KEFLEX  Take 1 capsule (500 mg total) by mouth 2 (two) times daily.     esomeprazole 40 MG capsule  Commonly known as:  NEXIUM  Take 1 capsule (40 mg total) by mouth every other day.     hyoscyamine 0.125 MG tablet  Commonly known as:  LEVSIN, ANASPAZ  Take 0.125 mg by mouth every 4 (four) hours as needed.     INVESTIGATIONAL DRUG SIMPLE RECORD  Take 75 mg by mouth daily with breakfast.     levothyroxine 125 MCG tablet  Commonly known as:  SYNTHROID, LEVOTHROID  Take 125 mcg by mouth daily before breakfast.     LORazepam 0.5 MG tablet  Commonly known as:  ATIVAN  Take 0.5 mg by mouth at bedtime as needed for anxiety.     niacinamide 500 MG tablet  Take 500 mg by mouth daily.     nitroGLYCERIN 0.4 MG SL tablet  Commonly known as:  NITROSTAT  Place 1 tablet (0.4 mg total) under the tongue every 5 (five) minutes as needed for chest pain.     omega-3 acid ethyl esters 1 G capsule   Commonly known as:  LOVAZA  Take 1 g by mouth 2 (two) times daily.     oxybutynin 5 MG tablet  Commonly known as:  DITROPAN  Take 5 mg by mouth 2 (two) times daily.     pyridOXINE 50 MG tablet  Commonly known as:  B-6  Take 50 mg by mouth daily.     vitamin B-12 500 MCG tablet  Commonly known as:  CYANOCOBALAMIN  Take 500 mcg by mouth daily.     vitamin E 400 UNIT capsule  Take 400 Units by mouth daily.       Allergies  Allergen Reactions  . Loratadine Other (See Comments)    Gi upset  . Adhesive [Tape] Other (See Comments)    Bruising and severe irritation  . Aspirin Other (See Comments)  Gi upset  . Levaquin [Levofloxacin In D5w] Itching  . Levofloxacin Nausea And Vomiting  . Lumigan [Bimatoprost] Other (See Comments)    Severe burning of eyes  . Mobic [Meloxicam] Nausea And Vomiting and Other (See Comments)    hallucinations  . Penicillins Diarrhea and Nausea And Vomiting  . Statins Other (See Comments)    Severe muscle pain       Follow-up Information   Follow up with SETHI,PRAMODKUMAR P, MD In 2 months. (On Monday call for an appointment to be seen in 2 months)    Specialties:  Neurology, Radiology   Contact information:   [address] [place] [address] [phone]        The results of significant diagnostics from this hospitalization (including imaging, microbiology, ancillary and laboratory) are listed below for reference.    Significant Diagnostic Studies: Ct Head (brain) Wo Contrast  11/25/2013   CLINICAL DATA:  79 year old female code stroke with right-sided symptoms. Initial encounter.  EXAM: CT HEAD WITHOUT CONTRAST  TECHNIQUE: Contiguous axial images were obtained from the base of the skull through the vertex without intravenous contrast.  COMPARISON:  08/30/2007.  FINDINGS: Visualized paranasal sinuses and mastoids are clear. No acute osseous abnormality identified. No acute orbit or scalp soft tissue findings.  Calcified  atherosclerosis at the skull base. No ventriculomegaly. Stable right frontal lobe sulcal variation (series 2, image 19). Less likely, this could reflect a small chronic arachnoid cyst. No acute intracranial hemorrhage identified. No evidence of cortically based acute infarction identified. Density of major intracranial vascular structures appear stable. Stable and normal gray-white matter differentiation.  IMPRESSION: No acute cortically based infarct or intracranial hemorrhage. Stable non contrast CT appearance of the brain since 2008.  Study discussed by telephone with Dr. Cyril Mourning on 11/25/2013 at 12:42 .   Electronically Signed   By: Augusto Gamble M.D.   On: 11/25/2013 12:43   Mr Angiogram Head Wo Contrast  11/25/2013   CLINICAL DATA:  Hypertensive hyperlipidemia patient presenting with right arm and leg weakness.  EXAM: MRI HEAD WITHOUT CONTRAST  MRA HEAD WITHOUT CONTRAST  TECHNIQUE: Multiplanar, multiecho pulse sequences of the brain and surrounding structures were obtained without intravenous contrast. Angiographic images of the head were obtained using MRA technique without contrast.  COMPARISON:  11/25/2013 head CT.  No comparison brain MR.  FINDINGS: MRI HEAD FINDINGS  Acute small nonhemorrhagic infarct at the junction of the right frontal -parietal lobe.  Subacute small infarct extends from the posterior left lenticular nucleus to posterior left coronal radiata.  Small vessel disease type changes with T2 shine through medial right parietal-occipital lobe junction on diffusion sequence.  No intracranial hemorrhage.  Global atrophy without hydrocephalus.  No intracranial mass lesion noted on this unenhanced exam.  Major intracranial vascular structures are patent.  Cervical medullary junction, pituitary region, pineal region and orbital structures unremarkable.  Major intracranial vascular structures are patent.  MRA HEAD FINDINGS  Anterior circulation without medium or large size vessel significant stenosis  or occlusion.  Ectatic patent vertebral arteries with left vertebral artery dominant in size. Mild narrowing of the distal right vertebral artery after takeoff of the right posterior inferior cerebellar artery. Non visualization left posterior inferior cerebellar artery.  Ectatic vertebral artery without high-grade stenosis.  Non visualization anterior inferior cerebellar arteries.  Mild irregularity and narrowing of the superior cerebellar arteries.  Tiny aneurysm superior aspect of the right internal carotid artery cavernous segment suspected.  IMPRESSION: Acute small nonhemorrhagic infarct at the  junction of the right frontal -parietal lobe.  Tiny aneurysm superior aspect of the right internal carotid artery cavernous segment suspected.  Please see above.   Electronically Signed   By: Bridgett Larsson M.D.   On: 11/25/2013 16:14   Mr Brain Wo Contrast  11/25/2013   CLINICAL DATA:  Hypertensive hyperlipidemia patient presenting with right arm and leg weakness.  EXAM: MRI HEAD WITHOUT CONTRAST  MRA HEAD WITHOUT CONTRAST  TECHNIQUE: Multiplanar, multiecho pulse sequences of the brain and surrounding structures were obtained without intravenous contrast. Angiographic images of the head were obtained using MRA technique without contrast.  COMPARISON:  11/25/2013 head CT.  No comparison brain MR.  FINDINGS: MRI HEAD FINDINGS  Acute small nonhemorrhagic infarct at the junction of the right frontal -parietal lobe.  Subacute small infarct extends from the posterior left lenticular nucleus to posterior left coronal radiata.  Small vessel disease type changes with T2 shine through medial right parietal-occipital lobe junction on diffusion sequence.  No intracranial hemorrhage.  Global atrophy without hydrocephalus.  No intracranial mass lesion noted on this unenhanced exam.  Major intracranial vascular structures are patent.  Cervical medullary junction, pituitary region, pineal region and orbital structures unremarkable.   Major intracranial vascular structures are patent.  MRA HEAD FINDINGS  Anterior circulation without medium or large size vessel significant stenosis or occlusion.  Ectatic patent vertebral arteries with left vertebral artery dominant in size. Mild narrowing of the distal right vertebral artery after takeoff of the right posterior inferior cerebellar artery. Non visualization left posterior inferior cerebellar artery.  Ectatic vertebral artery without high-grade stenosis.  Non visualization anterior inferior cerebellar arteries.  Mild irregularity and narrowing of the superior cerebellar arteries.  Tiny aneurysm superior aspect of the right internal carotid artery cavernous segment suspected.  IMPRESSION: Acute small nonhemorrhagic infarct at the junction of the right frontal -parietal lobe.  Tiny aneurysm superior aspect of the right internal carotid artery cavernous segment suspected.  Please see above.   Electronically Signed   By: Bridgett Larsson M.D.   On: 11/25/2013 16:14    Microbiology: No results found for this or any previous visit (from the past 240 hour(s)).   Labs: Basic Metabolic Panel:  Recent Labs Lab 11/25/13 1255 11/25/13 1303 11/25/13 1815 11/27/13 0205  NA 139 140  --  139  K 4.0 4.0  --  3.8  CL 101 101  --  102  CO2 28  --   --  26  GLUCOSE 105* 104*  --  92  BUN 12 11  --  13  CREATININE 0.48* 0.80 0.52 0.54  CALCIUM 9.5  --   --  9.2   Liver Function Tests:  Recent Labs Lab 11/25/13 1255  AST 26  ALT 20  ALKPHOS 60  BILITOT 1.3*  PROT 7.5  ALBUMIN 3.7   No results found for this basename: LIPASE, AMYLASE,  in the last 168 hours No results found for this basename: AMMONIA,  in the last 168 hours CBC:  Recent Labs Lab 11/25/13 1255 11/25/13 1303 11/25/13 1815 11/27/13 0205  WBC 5.5  --  5.7 5.2  NEUTROABS 3.3  --   --   --   HGB 14.6 14.3 14.6 13.7  HCT 41.0 42.0 41.1 39.4  MCV 85.2  --  84.9 86.6  PLT 192  --  194 184   Cardiac  Enzymes:  Recent Labs Lab 11/25/13 1255  TROPONINI <0.30   BNP: BNP (last 3 results) No results found for  this basename: PROBNP,  in the last 8760 hours CBG:  Recent Labs Lab 11/25/13 1224 11/25/13 2124 11/26/13 0647 11/26/13 1135  GLUCAP 102* 98 85 90       Signed:  LAMA,GAGAN S  [facility] 11/27/2013, 12:56 PM

## 2013-12-09 ENCOUNTER — Encounter: Payer: Self-pay | Admitting: Cardiovascular Disease

## 2013-12-09 ENCOUNTER — Ambulatory Visit (INDEPENDENT_AMBULATORY_CARE_PROVIDER_SITE_OTHER): Payer: Medicare Other | Admitting: Cardiovascular Disease

## 2013-12-09 ENCOUNTER — Encounter (INDEPENDENT_AMBULATORY_CARE_PROVIDER_SITE_OTHER): Payer: Self-pay

## 2013-12-09 VITALS — BP 152/64 | HR 56 | Ht 59.0 in | Wt 135.0 lb

## 2013-12-09 DIAGNOSIS — I2510 Atherosclerotic heart disease of native coronary artery without angina pectoris: Secondary | ICD-10-CM

## 2013-12-09 NOTE — Progress Notes (Signed)
HPI:   79 year old woman presenting for cardiac followup. She's been followed for coronary artery disease and hypertension. She's been under a lot of stress over the last several years because of her husband's advanced Alzheimer's disease.  The patient was hospitalized December 5 with symptoms of a stroke. She was sitting at a chair and had sudden onset of right leg weakness. She also had a brief period of right arm weakness. She called EMS and was transported emergently to the hospital. Her symptoms were improving by the time of arrival and she was not a candidate for TPA. Her symptoms have now completely resolved. There were MRI findings of a nonhemorrhagic cerebral infarct but this did not correlate with her localized neurologic symptoms. This was felt to represent an old event.  She has recovered well and has no further symptoms. She denies chest pain, shortness of breath, edema, or palpitations. An echocardiogram was done during her hospitalization and this showed normal left ventricular size and systolic function. There was no evidence of atrial fibrillation during her hospitalization. She has been started on aspirin and was also enrolled in the Point Trial where she is randomized to Plavix versus placebo.  Outpatient Encounter Prescriptions as of 12/09/2013  Medication Sig  . amLODipine (NORVASC) 5 MG tablet Take 5 mg by mouth daily.  Marland Kitchen aspirin EC 81 MG EC tablet Take 1 tablet (81 mg total) by mouth daily.  Marland Kitchen atenolol (TENORMIN) 25 MG tablet Take 25 mg by mouth 2 (two) times daily.  . Biotin (APPEAREX) 2.5 MG TABS Take 2.5 mg by mouth daily.  Marland Kitchen FLUARIX QUADRIVALENT 0.5 ML injection Inject 0.5 mLs into the muscle once.   . hyoscyamine (LEVSIN, ANASPAZ) 0.125 MG tablet Take 0.125 mg by mouth every 4 (four) hours as needed.  . INVESTIGATIONAL DRUG SIMPLE RECORD Take 75 mg by mouth daily with breakfast.  . Lactobacillus (ACIDOPHILUS) 10 MG CAPS Take 1 capsule by mouth daily.   Marland Kitchen  levothyroxine (SYNTHROID, LEVOTHROID) 125 MCG tablet Take 125 mcg by mouth daily before breakfast.  . LORazepam (ATIVAN) 0.5 MG tablet Take 0.5 mg by mouth at bedtime as needed for anxiety.  . niacinamide 500 MG tablet Take 500 mg by mouth daily.  . nitroGLYCERIN (NITROSTAT) 0.4 MG SL tablet Place 1 tablet (0.4 mg total) under the tongue every 5 (five) minutes as needed for chest pain.  Marland Kitchen omega-3 acid ethyl esters (LOVAZA) 1 G capsule Take 1 g by mouth 2 (two) times daily.  Marland Kitchen oxybutynin (DITROPAN) 5 MG tablet Take 5 mg by mouth 2 (two) times daily.   . Probiotic Product (ALIGN PO) Take 1 capsule by mouth daily.  Marland Kitchen pyridOXINE (B-6) 50 MG tablet Take 50 mg by mouth daily.  . ranitidine (ZANTAC) 150 MG tablet Take 1 tablet (150 mg total) by mouth 2 (two) times daily as needed for heartburn.  . vitamin B-12 (CYANOCOBALAMIN) 500 MCG tablet Take 500 mcg by mouth daily.   . vitamin E 400 UNIT capsule Take 400 Units by mouth daily.    Allergies  Allergen Reactions  . Loratadine Other (See Comments)    Gi upset  . Adhesive [Tape] Other (See Comments)    Bruising and severe irritation  . Aspirin Other (See Comments)    Gi upset  . Levaquin [Levofloxacin In D5w] Itching  . Levofloxacin Nausea And Vomiting  . Lumigan [Bimatoprost] Other (See Comments)    Severe burning of eyes  . Mobic [Meloxicam] Nausea And Vomiting and Other (See  Comments)    hallucinations  . Penicillins Diarrhea and Nausea And Vomiting  . Statins Other (See Comments)    Severe muscle pain    Past Medical History  Diagnosis Date  . Unspecified essential hypertension   . Hyperlipidemia   . GERD (gastroesophageal reflux disease)   . Candidiasis of skin and nails   . Internal hemorrhoids without mention of complication   . Hypothyroidism   . IBS (irritable bowel syndrome)   . Coronary atherosclerosis of unspecified type of vessel, native or graft CARDIOLOGIST- DR [facility]    DENIES S & S  . Borderline glaucoma   .  Adjustment disorder with anxiety STRESS CHEST DISCOMFORT    PT RECENTLY PUT HUSBAND IN NURSING HOME  . History of sleep apnea YRS AGO ON CPAP UNTIL LOST WT  . Pinched nerve LUMBAR    RESIDUAL LEFT FOOT NUMBNESS/ TINGLING  . Scoliosis   . Numbness and tingling of foot LEFT -- SECONDARY TO PINCHED LUMBAR NERVE  . Nocturia   . Stress incontinence   . Lesion of bladder     ROS: Negative except as per HPI  BP 152/64  Ht 4\' 11"  (1.499 m)  Wt 135 lb (61.236 kg)  BMI 27.25 kg/m2  PHYSICAL EXAM: Pt is alert and oriented, NAD HEENT: normal Neck: JVP - normal, carotids 2+= without bruits Lungs: CTA bilaterally CV: RRR without murmur or gallop Abd: soft, NT, Positive BS, no hepatomegaly Ext: no C/C/E, distal pulses intact and equal Skin: warm/dry no rash  EKG:  Reviewed from 11/25/2013 and this demonstrates marked sinus bradycardia 46 beats per minute with left anterior fascicular block.  2D Echo: Left ventricle: The cavity size was normal. Wall thickness was normal. Systolic function was vigorous. The estimated ejection fraction was in the range of 65% to 70%. Wall motion was normal; there were no regional wall motion abnormalities.  ------------------------------------------------------------ Aortic valve: Mildly thickened leaflets. Doppler: There was no stenosis. No significant regurgitation.  ------------------------------------------------------------ Aorta: Aortic root: The aortic root was normal in size. Ascending aorta: The ascending aorta was normal in size.  ------------------------------------------------------------ Mitral valve: Structurally normal valve. Doppler: Trivial regurgitation. Peak gradient: 4mm Hg (D).  ------------------------------------------------------------ Left atrium: The atrium was normal in size.  ------------------------------------------------------------ Right ventricle: The cavity size was normal. Systolic function was  normal.  ------------------------------------------------------------ Pulmonic valve: Structurally normal valve. Cusp separation was normal. Doppler: Transvalvular velocity was within the normal range. No regurgitation.  ------------------------------------------------------------ Tricuspid valve: Structurally normal valve. Leaflet separation was normal. Doppler: Transvalvular velocity was within the normal range. No regurgitation.  ------------------------------------------------------------ Right atrium: The atrium was normal in size.  ------------------------------------------------------------ Pericardium: There was no pericardial effusion.  MRI Brain: IMPRESSION: Acute small nonhemorrhagic infarct at the junction of the right frontal -parietal lobe.  Tiny aneurysm superior aspect of the right internal carotid artery cavernous segment suspected.  ASSESSMENT AND PLAN: 1. Coronary artery disease, native vessel. The patient is stable without symptoms of angina. She will continue on aspirin 81 mg, a beta blocker, and amlodipine. She is statin intolerant.  2. TIA. Followup per neurology. Hospital chart reviewed as well as all imaging studies.  3. Hypertension. Blood pressure control acceptable on amlodipine and atenolol.  For followup I will see her back in 6 months.  Tonny Bollman 12/09/2013 11:29 AM

## 2013-12-09 NOTE — Patient Instructions (Signed)
Your physician wants you to follow-up in: 6 MONTHS with Dr Cooper.  You will receive a reminder letter in the mail two months in advance. If you don't receive a letter, please call our office to schedule the follow-up appointment.  Your physician recommends that you continue on your current medications as directed. Please refer to the Current Medication list given to you today.  

## 2014-01-30 ENCOUNTER — Telehealth: Payer: Self-pay | Admitting: Internal Medicine

## 2014-01-30 NOTE — Telephone Encounter (Signed)
Pt states she needs work in appt with PCP to follow-up on stroke and cholesterol.

## 2014-01-30 NOTE — Telephone Encounter (Signed)
Per dr Lovell Sheehanjenkins- we just dont have anything available and dr Lovell Sheehanjenkins already has work in list.  Please offer pt an appointment with padonda.  Thank you.

## 2014-01-30 NOTE — Telephone Encounter (Signed)
Pt has been sch

## 2014-02-20 ENCOUNTER — Encounter: Payer: Self-pay | Admitting: Family

## 2014-02-20 ENCOUNTER — Ambulatory Visit (INDEPENDENT_AMBULATORY_CARE_PROVIDER_SITE_OTHER): Payer: Medicare Other | Admitting: Family

## 2014-02-20 VITALS — BP 120/58 | HR 62 | Wt 141.0 lb

## 2014-02-20 DIAGNOSIS — E7800 Pure hypercholesterolemia, unspecified: Secondary | ICD-10-CM

## 2014-02-20 DIAGNOSIS — F4320 Adjustment disorder, unspecified: Secondary | ICD-10-CM

## 2014-02-20 DIAGNOSIS — K219 Gastro-esophageal reflux disease without esophagitis: Secondary | ICD-10-CM

## 2014-02-20 DIAGNOSIS — Z8673 Personal history of transient ischemic attack (TIA), and cerebral infarction without residual deficits: Secondary | ICD-10-CM

## 2014-02-20 DIAGNOSIS — F4321 Adjustment disorder with depressed mood: Secondary | ICD-10-CM

## 2014-02-20 LAB — BASIC METABOLIC PANEL
BUN: 15 mg/dL (ref 6–23)
CHLORIDE: 100 meq/L (ref 96–112)
CO2: 33 meq/L — AB (ref 19–32)
Calcium: 9.1 mg/dL (ref 8.4–10.5)
Creatinine, Ser: 0.6 mg/dL (ref 0.4–1.2)
GFR: 100.02 mL/min (ref >60.00)
Glucose, Bld: 93 mg/dL (ref 70–99)
Potassium: 4.5 mEq/L (ref 3.5–5.1)
SODIUM: 137 meq/L (ref 135–145)

## 2014-02-20 LAB — HEPATIC FUNCTION PANEL
ALT: 25 U/L (ref 0–35)
AST: 25 U/L (ref 0–37)
Albumin: 3.6 g/dL (ref 3.5–5.2)
Alkaline Phosphatase: 48 U/L (ref 39–117)
Bilirubin, Direct: 0.2 mg/dL (ref 0.0–0.3)
Total Bilirubin: 1.5 mg/dL — ABNORMAL HIGH (ref 0.3–1.2)
Total Protein: 6.9 g/dL (ref 6.0–8.3)

## 2014-02-20 LAB — LIPID PANEL
CHOL/HDL RATIO: 4
Cholesterol: 208 mg/dL — ABNORMAL HIGH (ref 0–200)
HDL: 49.6 mg/dL (ref >39.00)
LDL CALC: 135 mg/dL — AB (ref 0–99)
Triglycerides: 117 mg/dL (ref 0.0–149.0)
VLDL: 23.4 mg/dL (ref 0.0–40.0)

## 2014-02-20 MED ORDER — LORAZEPAM 0.5 MG PO TABS: 0.5000 mg | ORAL_TABLET | Freq: Every evening | ORAL | Status: DC | PRN

## 2014-02-20 MED ORDER — RANITIDINE HCL 150 MG PO TABS: 150.0000 mg | ORAL_TABLET | Freq: Two times a day (BID) | ORAL | Status: DC | PRN

## 2014-02-20 NOTE — Progress Notes (Signed)
Pre visit review using our clinic review tool, if applicable. No additional management support is needed unless otherwise documented below in the visit note. 

## 2014-02-20 NOTE — Patient Instructions (Signed)
Grief Reaction  Grief is a normal response to the death of someone close to you. Feelings of fear, anger, and guilt can affect almost everyone who loses someone they love. Symptoms of depression are also common. These include problems with sleep, loss of appetite, and lack of energy. These grief reaction symptoms often last for weeks to months after a loss. They may also return during special times that remind you of the person you lost, such as an anniversary or birthday.  Anxiety, insomnia, irritability, and deep depression may last beyond the period of normal grief. If you experience these feelings for 6 months or longer, you may have clinical depression. Clinical depression requires further medical attention. If you think that you have clinical depression, you should contact your caregiver. If you have a history of depression and or a family history of depression, you are at greater risk of clinical depression. You are also at greater risk of developing clinical depression if the loss was traumatic or the loss was of someone with whom you had unresolved issues.   A grief reaction can become complicated by being blocked. This means being unable to cry or express extreme emotions. This may prolong the grieving period and worsen the emotional effects of the loss. Mourning is a natural event in human life. A healthy grief reaction is one that is not blocked . It requires a time of sadness and readjustment.It is very important to share your sorrow and fear with others, especially close friends and family. Professional counselors and clergy can also help you process your grief.  Document Released: 12/08/2005 Document Revised: 03/01/2012 Document Reviewed: 08/18/2006  [employer] Patient Information 2014 [employer]

## 2014-02-20 NOTE — Progress Notes (Signed)
Subjective:    Patient ID: Joyce [name], female    DOB: 01/12/1931, 80 y.o.   MRN: [id number]  HPI 80 year old white female, nonsmoker, patient of Dr. Lovell Sheehan presents today as an emergency department followup from 12/05/2013. She presented with weakness and was diagnosed with a CVA. She is following up with neurology tomorrow. She has no residual effects from the stroke. Reports increased stress due to the death of her husband on 02/01/2014. Reports selling anxious but denies any depression. Has taken Ativan 0.5 mg at bedtime to help her sleep.   Review of Systems  Constitutional: Negative.   Respiratory: Negative.   Cardiovascular: Negative.   Gastrointestinal: Negative.   Endocrine: Negative.   Genitourinary: Negative.   Musculoskeletal: Negative.   Skin: Negative.   Neurological: Negative.   Hematological: Negative.   Psychiatric/Behavioral: Negative for sleep disturbance. The patient is nervous/anxious.    Past Medical History  Diagnosis Date  . Unspecified essential hypertension   . Hyperlipidemia   . GERD (gastroesophageal reflux disease)   . Candidiasis of skin and nails   . Internal hemorrhoids without mention of complication   . Hypothyroidism   . IBS (irritable bowel syndrome)   . Coronary atherosclerosis of unspecified type of vessel, native or graft CARDIOLOGIST- DR [facility]    DENIES S & S  . Borderline glaucoma   . Adjustment disorder with anxiety STRESS CHEST DISCOMFORT    PT RECENTLY PUT HUSBAND IN NURSING HOME  . History of sleep apnea YRS AGO ON CPAP UNTIL LOST WT  . Pinched nerve LUMBAR    RESIDUAL LEFT FOOT NUMBNESS/ TINGLING  . Scoliosis   . Numbness and tingling of foot LEFT -- SECONDARY TO PINCHED LUMBAR NERVE  . Nocturia   . Stress incontinence   . Lesion of bladder     History   Social History  . Marital Status: Married    Spouse Name: N/A    Number of Children: N/A  . Years of Education: N/A   Occupational History  . retired     Social History Main Topics  . Smoking status: Former Smoker -- 1.50 packs/day for 20 years    Types: Cigarettes    Quit date: 07/31/1983  . Smokeless tobacco: Never Used  . Alcohol Use: No  . Drug Use: No  . Sexual Activity: Not on file   Other Topics Concern  . Not on file   Social History Narrative  . No narrative on file    Past Surgical History  Procedure Laterality Date  . Vaginal hysterectomy  1968  . Cholecystectomy  1976  . Cataract extraction w/ intraocular lens  implant, bilateral    . Cardiac catheterization  01-10-2003   DR Chrissie Noa DOWNEY    PATENT CIRCUMFLEX STENT/ MODERATE CAD ELSEWHERE  . Coronary angioplasty with stent placement  08-31-2001   DR BRUCE BRODIE    STENTING OF PROXIMAL CIRCUMFLEX (75% TO 0%)   . Cardiovascular stress test  07-27-2012    NORMAL NUCLEAR STUDY/ LVEF 81%  . Cystoscopy with biopsy  08/02/2012    Procedure: CYSTOSCOPY WITH BIOPSY;  Surgeon: Milford Cage, MD;  Location: [facility];  Service: Urology;  Laterality: N/A;    Family History  Problem Relation Age of Onset  . Heart disease Mother   . Cancer Mother     Allergies  Allergen Reactions  . Loratadine Other (See Comments)    Gi upset  . Adhesive [Tape] Other (See Comments)    Bruising  and severe irritation  . Aspirin Other (See Comments)    Gi upset  . Levaquin [Levofloxacin In D5w] Itching  . Levofloxacin Nausea And Vomiting  . Lumigan [Bimatoprost] Other (See Comments)    Severe burning of eyes  . Mobic [Meloxicam] Nausea And Vomiting and Other (See Comments)    hallucinations  . Penicillins Diarrhea and Nausea And Vomiting  . Statins Other (See Comments)    Severe muscle pain    Current Outpatient Prescriptions on File Prior to Visit  Medication Sig Dispense Refill  . amLODipine (NORVASC) 5 MG tablet Take 5 mg by mouth daily.      Marland Kitchen. aspirin EC 81 MG EC tablet Take 1 tablet (81 mg total) by mouth daily.  30 tablet  2  . atenolol  (TENORMIN) 25 MG tablet Take 25 mg by mouth 2 (two) times daily.      . Biotin (APPEAREX) 2.5 MG TABS Take 2.5 mg by mouth daily.      . hyoscyamine (LEVSIN, ANASPAZ) 0.125 MG tablet Take 0.125 mg by mouth every 4 (four) hours as needed.      . INVESTIGATIONAL DRUG SIMPLE RECORD Take 75 mg by mouth daily with breakfast.      . Lactobacillus (ACIDOPHILUS) 10 MG CAPS Take 1 capsule by mouth daily.       Marland Kitchen. levothyroxine (SYNTHROID, LEVOTHROID) 125 MCG tablet Take 125 mcg by mouth daily before breakfast.      . niacinamide 500 MG tablet Take 500 mg by mouth daily.      . nitroGLYCERIN (NITROSTAT) 0.4 MG SL tablet Place 1 tablet (0.4 mg total) under the tongue every 5 (five) minutes as needed for chest pain.  25 tablet  3  . omega-3 acid ethyl esters (LOVAZA) 1 G capsule Take 1 g by mouth 2 (two) times daily.      Marland Kitchen. oxybutynin (DITROPAN) 5 MG tablet Take 5 mg by mouth 2 (two) times daily.       . Probiotic Product (ALIGN PO) Take 1 capsule by mouth daily.      Marland Kitchen. pyridOXINE (B-6) 50 MG tablet Take 50 mg by mouth daily.      . vitamin B-12 (CYANOCOBALAMIN) 500 MCG tablet Take 500 mcg by mouth daily.       . vitamin E 400 UNIT capsule Take 400 Units by mouth daily.      Marland Kitchen. FLUARIX QUADRIVALENT 0.5 ML injection Inject 0.5 mLs into the muscle once.        No current facility-administered medications on file prior to visit.    BP 120/58  Pulse 62  Wt 141 lb (63.957 kg)chart    Objective:   Physical Exam  Constitutional: She is oriented to person, place, and time. She appears well-developed and well-nourished.  Neck: Normal range of motion. Neck supple.  Cardiovascular: Normal rate, regular rhythm and normal heart sounds.   Pulmonary/Chest: Effort normal and breath sounds normal.  Abdominal: Soft. Bowel sounds are normal.  Musculoskeletal: Normal range of motion.  Neurological: She is alert and oriented to person, place, and time. She has normal reflexes. No cranial nerve deficit.  Skin: Skin is  warm and dry.  Psychiatric: She has a normal mood and affect.          Assessment & Plan:  Joyce [name] was seen today for hospital follow up.  Diagnoses and associated orders for this visit:  GERD (gastroesophageal reflux disease)  History of CVA (cerebrovascular accident)  Hypercholesteremia - Lipid Panel - Hepatic Function  Panel - Basic Metabolic Panel  Grief reaction  Other Orders - ranitidine (ZANTAC) 150 MG tablet; Take 1 tablet (150 mg total) by mouth 2 (two) times daily as needed for heartburn. - LORazepam (ATIVAN) 0.5 MG tablet; Take 1 tablet (0.5 mg total) by mouth at bedtime as needed for anxiety.    follow neurology as scheduled. Continue current medications. Consider SSRI is anxiety depression continues.

## 2014-02-21 ENCOUNTER — Encounter (INDEPENDENT_AMBULATORY_CARE_PROVIDER_SITE_OTHER): Payer: Self-pay

## 2014-02-21 ENCOUNTER — Ambulatory Visit: Payer: Medicare Other

## 2014-02-21 DIAGNOSIS — Z0289 Encounter for other administrative examinations: Secondary | ICD-10-CM

## 2014-02-21 DIAGNOSIS — E039 Hypothyroidism, unspecified: Secondary | ICD-10-CM

## 2014-02-21 LAB — TSH: TSH: 0.13 u[IU]/mL — ABNORMAL LOW (ref 0.35–5.50)

## 2014-02-22 ENCOUNTER — Other Ambulatory Visit: Payer: Self-pay | Admitting: Family

## 2014-02-22 MED ORDER — LEVOTHYROXINE SODIUM 112 MCG PO TABS: 112.0000 ug | ORAL_TABLET | Freq: Every day | ORAL | Status: DC

## 2014-03-01 ENCOUNTER — Other Ambulatory Visit: Payer: Self-pay | Admitting: *Deleted

## 2014-03-20 ENCOUNTER — Encounter (INDEPENDENT_AMBULATORY_CARE_PROVIDER_SITE_OTHER): Payer: Self-pay

## 2014-03-20 DIAGNOSIS — Z0289 Encounter for other administrative examinations: Secondary | ICD-10-CM

## 2014-03-23 ENCOUNTER — Encounter (INDEPENDENT_AMBULATORY_CARE_PROVIDER_SITE_OTHER): Payer: Self-pay

## 2014-03-23 DIAGNOSIS — Z0289 Encounter for other administrative examinations: Secondary | ICD-10-CM

## 2014-04-17 ENCOUNTER — Other Ambulatory Visit: Payer: Self-pay | Admitting: Cardiovascular Disease

## 2014-04-18 ENCOUNTER — Other Ambulatory Visit: Payer: Self-pay

## 2014-04-18 DIAGNOSIS — Z1231 Encounter for screening mammogram for malignant neoplasm of breast: Secondary | ICD-10-CM

## 2014-05-31 ENCOUNTER — Other Ambulatory Visit: Payer: Self-pay | Admitting: Cardiovascular Disease

## 2014-05-31 ENCOUNTER — Other Ambulatory Visit: Payer: Self-pay | Admitting: Family

## 2014-06-06 ENCOUNTER — Telehealth: Payer: Self-pay | Admitting: Internal Medicine

## 2014-06-06 NOTE — Telephone Encounter (Signed)
Pt would like to hold off on scheduling w/ [facility] until she hears if Dr Fabian SharpPanosh will accept her.

## 2014-06-06 NOTE — Telephone Encounter (Signed)
Pt would like to know if you will accept her as a pt? Her daughter and son in law see you and she has seen you previously when jenkins was out. ([place] and [place])

## 2014-06-06 NOTE — Telephone Encounter (Signed)
lmovm for pt to call back and make an appt to be est with Dr Durene CalHunter  August or September

## 2014-06-07 ENCOUNTER — Ambulatory Visit: Payer: Medicare Other

## 2014-06-08 ENCOUNTER — Ambulatory Visit: Payer: Medicare Other | Admitting: Physician Assistant

## 2014-06-11 NOTE — Telephone Encounter (Signed)
Decline at this time  Best she establish with on e of the newer providers

## 2014-06-13 NOTE — Telephone Encounter (Signed)
Decline by Dr Fabian SharpPanosh I contacted  pt she has been scheduled to see hunter in august

## 2014-06-16 NOTE — Telephone Encounter (Signed)
Lm on vm to cb °

## 2014-06-16 NOTE — Telephone Encounter (Signed)
Pt called back. She already has an appointment to est care with Dr. Durene CalHunter.

## 2014-06-21 ENCOUNTER — Encounter: Payer: Self-pay | Admitting: Neurology

## 2014-06-27 ENCOUNTER — Other Ambulatory Visit: Payer: Self-pay | Admitting: Cardiovascular Disease

## 2014-06-29 ENCOUNTER — Encounter (INDEPENDENT_AMBULATORY_CARE_PROVIDER_SITE_OTHER): Payer: Self-pay | Admitting: Neurology

## 2014-06-29 DIAGNOSIS — Z0289 Encounter for other administrative examinations: Secondary | ICD-10-CM

## 2014-08-01 ENCOUNTER — Ambulatory Visit
Admission: RE | Admit: 2014-08-01 | Discharge: 2014-08-01 | Disposition: A | Discharge status: Home / Self Care | Payer: Medicare Other | Source: Ambulatory Visit

## 2014-08-01 DIAGNOSIS — Z1231 Encounter for screening mammogram for malignant neoplasm of breast: Secondary | ICD-10-CM

## 2014-08-02 ENCOUNTER — Ambulatory Visit: Payer: Medicare Other

## 2014-08-02 ENCOUNTER — Telehealth: Payer: Self-pay | Admitting: Internal Medicine

## 2014-08-02 NOTE — Telephone Encounter (Signed)
[id number] - [place], [place] - [address] Is requesting re-fill on SYNTHROID 112 MCG tablet

## 2014-08-07 MED ORDER — SYNTHROID 112 MCG PO TABS: ORAL_TABLET | ORAL | Status: DC

## 2014-08-07 NOTE — Telephone Encounter (Signed)
Pt needs an lab appt per Padonda's note.  Please schedule a lab appt and then I can call in med.  She also needs to establish with a new provider

## 2014-08-07 NOTE — Telephone Encounter (Signed)
rx sent in electronically 

## 2014-08-07 NOTE — Telephone Encounter (Signed)
Pt has one tab left. Pt has appt 8/24 but needs refill and request 90 day is possible. [facility]/ [address] can refill. SYNTHROID 112 MCG tablet

## 2014-08-07 NOTE — Telephone Encounter (Signed)
Pt is scheduled w/ dr hunter next  Mon/ 8/24 . However, is out of her med. Thanks!

## 2014-08-11 ENCOUNTER — Encounter: Payer: Self-pay | Admitting: Cardiovascular Disease

## 2014-08-11 ENCOUNTER — Ambulatory Visit (INDEPENDENT_AMBULATORY_CARE_PROVIDER_SITE_OTHER): Payer: Medicare Other | Admitting: Cardiovascular Disease

## 2014-08-11 VITALS — BP 160/56 | HR 55 | Ht 59.0 in | Wt 142.0 lb

## 2014-08-11 DIAGNOSIS — I209 Angina pectoris, unspecified: Secondary | ICD-10-CM

## 2014-08-11 DIAGNOSIS — R079 Chest pain, unspecified: Secondary | ICD-10-CM

## 2014-08-11 DIAGNOSIS — I25119 Atherosclerotic heart disease of native coronary artery with unspecified angina pectoris: Secondary | ICD-10-CM

## 2014-08-11 DIAGNOSIS — I2510 Atherosclerotic heart disease of native coronary artery without angina pectoris: Secondary | ICD-10-CM

## 2014-08-11 NOTE — Patient Instructions (Signed)
Your physician has requested that you have a lexiscan myoview. For further information please visit [email or web address]. Please follow instruction sheet, as given.  Your physician wants you to follow-up in: 1 YEAR with Dr Excell Seltzerooper.  You will receive a reminder letter in the mail two months in advance. If you don't receive a letter, please call our office to schedule the follow-up appointment.  Your physician recommends that you continue on your current medications as directed. Please refer to the Current Medication list given to you today.

## 2014-08-11 NOTE — Progress Notes (Signed)
HPI:  80 year old woman presenting for cardiac followup evaluation. She's been followed for CAD, hypertension, and stroke. She underwent remote PCI of the left circumflex with stenting back in 2002. She was hospitalized in December 2014 with sudden onset of right leg weakness. She was diagnosed with an ischemic infarct.  The patient has had a difficult year. She lost her husband in January. He had a prolonged illness with Alzheimer's dementia. She's also had 2 brothers passed away this year. Her younger sister had multivessel coronary bypass surgery.  She's had a good bit of anxiety. She also complains of discomfort in her chest. This is primarily related to emotional stress and has not occurred with exertion. She has mild shortness of breath with activity. She complains of left leg swelling. She's had a previous ankle injury on the left. No orthopnea, PND, or heart palpitations.  Outpatient Encounter Prescriptions as of 08/11/2014  Medication Sig  . amLODipine (NORVASC) 2.5 MG tablet TAKE ONE TABLET BY MOUTH ONCE DAILY  . atenolol (TENORMIN) 25 MG tablet TAKE ONE TABLET BY MOUTH TWICE DAILY  . Biotin (APPEAREX) 2.5 MG TABS Take 2.5 mg by mouth daily.  Marland Kitchen FLUARIX QUADRIVALENT 0.5 ML injection Inject 0.5 mLs into the muscle once.   . hyoscyamine (LEVSIN, ANASPAZ) 0.125 MG tablet Take 0.125 mg by mouth every 4 (four) hours as needed.  . INVESTIGATIONAL DRUG SIMPLE RECORD Take 75 mg by mouth daily with breakfast.  . Lactobacillus (ACIDOPHILUS) 10 MG CAPS Take 1 capsule by mouth daily.   Marland Kitchen LORazepam (ATIVAN) 0.5 MG tablet Take 1 tablet (0.5 mg total) by mouth at bedtime as needed for anxiety.  . niacinamide 500 MG tablet Take 500 mg by mouth daily.  . nitroGLYCERIN (NITROSTAT) 0.4 MG SL tablet Place 1 tablet (0.4 mg total) under the tongue every 5 (five) minutes as needed for chest pain.  Marland Kitchen omega-3 acid ethyl esters (LOVAZA) 1 G capsule Take 1 g by mouth 2 (two) times daily.  Marland Kitchen oxybutynin  (DITROPAN) 5 MG tablet Take 5 mg by mouth 2 (two) times daily.   . Probiotic Product (ALIGN PO) Take 1 capsule by mouth daily.  Marland Kitchen pyridOXINE (B-6) 50 MG tablet Take 50 mg by mouth daily.  . ranitidine (ZANTAC) 150 MG tablet TAKE ONE TABLET BY MOUTH TWICE DAILY AS NEEDED FOR HEARTBURN  . SYNTHROID 112 MCG tablet TAKE ONE TABLET BY MOUTH ONCE DAILY  . vitamin B-12 (CYANOCOBALAMIN) 500 MCG tablet Take 500 mcg by mouth daily.   . vitamin E 400 UNIT capsule Take 400 Units by mouth daily.  . [DISCONTINUED] aspirin EC 81 MG EC tablet Take 1 tablet (81 mg total) by mouth daily.  . [DISCONTINUED] atenolol (TENORMIN) 25 MG tablet TAKE ONE TABLET BY MOUTH TWICE DAILY  . [DISCONTINUED] SYNTHROID 112 MCG tablet TAKE ONE TABLET BY MOUTH ONCE DAILY    Allergies  Allergen Reactions  . Loratadine Other (See Comments)    Gi upset  . Adhesive [Tape] Other (See Comments)    Bruising and severe irritation  . Aspirin Other (See Comments)    Gi upset  . Levaquin [Levofloxacin In D5w] Itching  . Levofloxacin Nausea And Vomiting  . Lumigan [Bimatoprost] Other (See Comments)    Severe burning of eyes  . Mobic [Meloxicam] Nausea And Vomiting and Other (See Comments)    hallucinations  . Penicillins Diarrhea and Nausea And Vomiting  . Statins Other (See Comments)    Severe muscle pain    Past Medical History  Diagnosis Date  . Unspecified essential hypertension   . Hyperlipidemia   . GERD (gastroesophageal reflux disease)   . Candidiasis of skin and nails   . Internal hemorrhoids without mention of complication   . Hypothyroidism   . IBS (irritable bowel syndrome)   . Coronary atherosclerosis of unspecified type of vessel, native or graft [facility]IST[facility]    DENIES S & S  . Borderline glaucoma   . Adjustment disorder with anxiety STRESS CHEST DISCOMFORT    PT RECENTLY PUT HUSBAND IN NURSING HOME  . History of sleep apnea YRS AGO ON CPAP UNTIL LOST WT  . Pinched nerve LUMBAR    RESIDUAL  LEFT FOOT NUMBNESS/ TINGLING  . Scoliosis   . Numbness and tingling of foot LEFT -- SECONDARY TO PINCHED LUMBAR NERVE  . Nocturia   . Stress incontinence   . Lesion of bladder     ROS: Negative except as per HPI  BP 160/56  Pulse 55  Ht 4\' 11"  (1.499 m)  Wt 142 lb (64.411 kg)  BMI 28.67 kg/m2  PHYSICAL EXAM: Pt is alert and oriented, pleasant elderly woman in NAD HEENT: normal Neck: JVP - normal, carotids 2+= without bruits Lungs: CTA bilaterally CV: RRR without murmur or gallop Abd: soft, NT, Positive BS, no hepatomegaly Ext: Trace edema on the left, distal pulses intact and equal Skin: Prominent spider veins on the left side  EKG:  Sinus bradycardia 55 beats per minute left ventricular hypertrophy with repolarization abnormality.  ASSESSMENT AND PLAN: 1. Coronary artery disease, native vessel. The patient is having some chest discomfort associated with emotional stress. She had remote stenting 13 years ago. I have recommended a Lexiscan Myoview stress test to rule out progressive ischemia. She will continue on her current medical program.  2. TIA. Followup per neurology. She's had a stroke study randomizing patients to pradaxa or aspirin.  3. Hypertension. Reviewed blood pressures over multiple readings and blood pressure control is adequate. She will continue on amlodipine at low dose.  4. Hyperlipidemia. The patient is statin intolerant. She remains on niacin. Most recent lipids as below:  Lipid Panel     Component Value Date/Time   CHOL 208* 02/20/2014 1559   TRIG 117.0 02/20/2014 1559   HDL 49.60 02/20/2014 1559   CHOLHDL 4 02/20/2014 1559   VLDL 23.4 02/20/2014 1559   LDLCALC 135* 02/20/2014 1559     For followup I will see her back in 12 months.  Tonny BollmanMichael Samba [place] 08/11/2014 3:51 PM

## 2014-08-14 ENCOUNTER — Encounter: Payer: Self-pay | Admitting: Family Medicine

## 2014-08-14 ENCOUNTER — Ambulatory Visit (INDEPENDENT_AMBULATORY_CARE_PROVIDER_SITE_OTHER): Payer: Medicare Other | Admitting: Family Medicine

## 2014-08-14 VITALS — BP 130/82 | HR 56 | Temp 98.3°F | Ht 59.0 in | Wt 143.0 lb

## 2014-08-14 DIAGNOSIS — E785 Hyperlipidemia, unspecified: Secondary | ICD-10-CM

## 2014-08-14 DIAGNOSIS — N318 Other neuromuscular dysfunction of bladder: Secondary | ICD-10-CM

## 2014-08-14 DIAGNOSIS — F4322 Adjustment disorder with anxiety: Secondary | ICD-10-CM

## 2014-08-14 DIAGNOSIS — I2510 Atherosclerotic heart disease of native coronary artery without angina pectoris: Secondary | ICD-10-CM

## 2014-08-14 DIAGNOSIS — N3281 Overactive bladder: Secondary | ICD-10-CM

## 2014-08-14 DIAGNOSIS — E039 Hypothyroidism, unspecified: Secondary | ICD-10-CM

## 2014-08-14 HISTORY — DX: Overactive bladder: N32.81

## 2014-08-14 MED ORDER — NITROGLYCERIN 0.4 MG SL SUBL: 0.4000 mg | SUBLINGUAL_TABLET | SUBLINGUAL | Status: DC | PRN

## 2014-08-14 MED ORDER — RANITIDINE HCL 150 MG PO TABS: ORAL_TABLET | ORAL | Status: DC

## 2014-08-14 MED ORDER — ATENOLOL 25 MG PO TABS: ORAL_TABLET | ORAL | Status: DC

## 2014-08-14 MED ORDER — LEVOTHYROXINE SODIUM 100 MCG PO TABS: 100.0000 ug | ORAL_TABLET | Freq: Every day | ORAL | Status: DC

## 2014-08-14 MED ORDER — LORAZEPAM 0.5 MG PO TABS: 0.2500 mg | ORAL_TABLET | Freq: Every evening | ORAL | Status: DC | PRN

## 2014-08-14 MED ORDER — AMLODIPINE BESYLATE 2.5 MG PO TABS: ORAL_TABLET | ORAL | Status: DC

## 2014-08-14 NOTE — Assessment & Plan Note (Addendum)
PHQ9 2/27. Doubt depression. Still mourning from loss of husband. Has support group but not 1 on 1 therapy. Referred to [facility] for counseling. Patient to try 1/4 0.5mg  ativan at bedtime to see if she can still sleep with this (to calm anxiety). Eventually trial off of medication-discussed I do not prescribe chronic nightly benzos for sleep-this is an attempted wean.

## 2014-08-14 NOTE — Patient Instructions (Signed)
Wonderful to meet you!   I think it would be helpful if you met with one of our counselors. Please call the number I gave you.   Thyroid-decrease dose to 126mg once a day  Overactive bladder-try 1/2 a pill of oxybutynin twice a day, if this doesn't work for you there are other medications we could try  Anxiety/sleep-try to cut back to 1/4 a pill to help you sleep at night. May eventually be able to come off.

## 2014-08-14 NOTE — Assessment & Plan Note (Signed)
Last TSH suppressed. Decrease synthroid to (on name brand). F/u testing 6 weeks.

## 2014-08-14 NOTE — Assessment & Plan Note (Signed)
Trial 2.5 mg BID oxybutynin. May not be clinically effective. SE- dry mouth and constipation. May need to trial myrbetriq.

## 2014-08-14 NOTE — Progress Notes (Signed)
Tana Conch, MD Phone: [phone]  Subjective:  Patient presents today to establish care. Chief complaint-noted.   Stress/anxiety Saw bereavement counselor with hospice for 2 years.  In 2 support groups after losing her husband in December 2014, has also lost 2 brothers since that time.  PHQ9-2/27.  Gets upset when running behind, this is her main anxiety She is intermittently tearful due to loneliness or family issues.  Takes 0.25 or 0.125 mg ativan as needed for sleep anxiety related. Does admit to feeling foggy when gets up when on medication.  ROS- No SI/HI  Hypothyroidism.  TSH suppressed ROS- dry mouth, some issues with constipation (twice daily at times but some small hard pellets)  Overactive bladder Oxybutynin  twice a day. Gets dry mouth and some constipation with this ROS- denies dizziness or lightheadedness  The following were reviewed and entered/updated in epic: Past Medical History  Diagnosis Date  . Unspecified essential hypertension   . Hyperlipidemia   . GERD (gastroesophageal reflux disease)   . Candidiasis of skin and nails   . Internal hemorrhoids without mention of complication   . Hypothyroidism   . IBS (irritable bowel syndrome)   . Coronary atherosclerosis of unspecified type of vessel, native or graft CARDIOLOGIST- DR [facility]    DENIES S & S  . Borderline glaucoma   . Adjustment disorder with anxiety STRESS CHEST DISCOMFORT    PT RECENTLY PUT HUSBAND IN NURSING HOME  . History of sleep apnea YRS AGO ON CPAP UNTIL LOST WT  . Pinched nerve LUMBAR    RESIDUAL LEFT FOOT NUMBNESS/ TINGLING  . Scoliosis   . Numbness and tingling of foot LEFT -- SECONDARY TO PINCHED LUMBAR NERVE  . Nocturia   . Stress incontinence   . Lesion of bladder    Patient Active Problem List   Diagnosis Date Noted  . CVA (cerebral infarction) 11/25/2013    Priority: High  . CORONARY ARTERY DISEASE 04/05/2008    Priority: High  . HYPERTENSION, MODERATE  01/17/2010    Priority: Medium  . HYPERLIPIDEMIA 01/15/2009    Priority: Medium  . ADJUSTMENT DISORDER WITH ANXIOUS MOOD 01/15/2009    Priority: Medium  . HYPOTHYROIDISM 04/05/2008    Priority: Medium  . IRRITABLE BOWEL SYNDROME, HX OF 04/05/2008    Priority: Medium  . Sinusitis 09/29/2013    Priority: Low  . Palpitations 03/01/2013    Priority: Low  . Chest pain, unspecified 03/01/2013    Priority: Low  . Lumbar pain with radiation down left leg 02/21/2011    Priority: Low  . CERUMEN IMPACTION, BILATERAL 11/27/2010    Priority: Low  . GERD 04/05/2008    Priority: Low  . INTERNAL HEMORRHOIDS 06/29/2002    Priority: Low   Past Surgical History  Procedure Laterality Date  . Vaginal hysterectomy  1968  . Cholecystectomy  1976  . Cataract extraction w/ intraocular lens  implant, bilateral    . Cardiac catheterization  01-10-2003   DR Chrissie Noa DOWNEY    PATENT CIRCUMFLEX STENT/ MODERATE CAD ELSEWHERE  . Coronary angioplasty with stent placement  08-31-2001   DR BRUCE BRODIE    STENTING OF PROXIMAL CIRCUMFLEX (75% TO 0%)   . Cardiovascular stress test  07-27-2012    NORMAL NUCLEAR STUDY/ LVEF 81%  . Cystoscopy with biopsy  08/02/2012    Procedure: CYSTOSCOPY WITH BIOPSY;  Surgeon: Milford Cage, MD;  Location: [facility];  Service: Urology;  Laterality: N/A;    Family History  Problem Relation Age  of Onset  . Heart disease Mother   . Cancer Mother     Medications- reviewed and updated Current Outpatient Prescriptions  Medication Sig Dispense Refill  . amLODipine (NORVASC) 2.5 MG tablet TAKE ONE TABLET BY MOUTH ONCE DAILY  90 tablet  0  . atenolol (TENORMIN) 25 MG tablet TAKE ONE TABLET BY MOUTH TWICE DAILY  180 tablet  0  . Biotin (APPEAREX) 2.5 MG TABS Take 2.5 mg by mouth daily.      Marland Kitchen FLUARIX QUADRIVALENT 0.5 ML injection Inject 0.5 mLs into the muscle once.       . hyoscyamine (LEVSIN, ANASPAZ) 0.125 MG tablet Take 0.125 mg by mouth every 4  (four) hours as needed.      . INVESTIGATIONAL DRUG SIMPLE RECORD Take 75 mg by mouth daily with breakfast.      . Lactobacillus (ACIDOPHILUS) 10 MG CAPS Take 1 capsule by mouth daily.       Marland Kitchen LORazepam (ATIVAN) 0.5 MG tablet Take 1 tablet (0.5 mg total) by mouth at bedtime as needed for anxiety.  30 tablet  3  . niacinamide 500 MG tablet Take 500 mg by mouth daily.      Marland Kitchen omega-3 acid ethyl esters (LOVAZA) 1 G capsule Take 1 g by mouth 2 (two) times daily.      Marland Kitchen oxybutynin (DITROPAN) 5 MG tablet Take 5 mg by mouth 2 (two) times daily.       . Probiotic Product (ALIGN PO) Take 1 capsule by mouth daily.      Marland Kitchen pyridOXINE (B-6) 50 MG tablet Take 50 mg by mouth daily.      . ranitidine (ZANTAC) 150 MG tablet TAKE ONE TABLET BY MOUTH TWICE DAILY AS NEEDED FOR HEARTBURN  60 tablet  1  . SYNTHROID 112 MCG tablet TAKE ONE TABLET BY MOUTH ONCE DAILY  30 tablet  0  . vitamin B-12 (CYANOCOBALAMIN) 500 MCG tablet Take 500 mcg by mouth daily.       . vitamin E 400 UNIT capsule Take 400 Units by mouth daily.      . nitroGLYCERIN (NITROSTAT) 0.4 MG SL tablet Place 1 tablet (0.4 mg total) under the tongue every 5 (five) minutes as needed for chest pain.  25 tablet  3   No current facility-administered medications for this visit.    Allergies-reviewed and updated Allergies  Allergen Reactions  . Loratadine Other (See Comments)    Gi upset  . Adhesive [Tape] Other (See Comments)    Bruising and severe irritation  . Aspirin Other (See Comments)    Gi upset  . Levaquin [Levofloxacin In D5w] Itching  . Levofloxacin Nausea And Vomiting  . Lumigan [Bimatoprost] Other (See Comments)    Severe burning of eyes  . Mobic [Meloxicam] Nausea And Vomiting and Other (See Comments)    hallucinations  . Penicillins Diarrhea and Nausea And Vomiting  . Statins Other (See Comments)    Severe muscle pain    History   Social History  . Marital Status: Married    Spouse Name: N/A    Number of Children: N/A  .  Years of Education: N/A   Occupational History  . retired    Social History Main Topics  . Smoking status: Former Smoker -- 1.50 packs/day for 20 years    Types: Cigarettes    Quit date: 07/31/1983  . Smokeless tobacco: Never Used  . Alcohol Use: No  . Drug Use: No  . Sexual Activity: None   Other  Topics Concern  . None   Social History Narrative  . None    ROS--See HPI   Objective: BP 130/82  Pulse 56  Temp(Src) 98.3 F (36.8 C)  Ht  (1.499 m)  Wt 143 lb (64.864 kg)  BMI 28.87 kg/m2 Gen: NAD, resting comfortably in chair HEENT: mildly dry mucus membranes Neck: no thyromegaly CV: RRR no murmurs rubs or gallops Lungs: CTAB no crackles, wheeze, rhonchi Abdomen: soft/nontender/nondistended.  Ext: trace edema left foot (history of fraction), no calf tenderness, equal calf size Skin: warm, dry, no rash  Assessment/Plan:  ADJUSTMENT DISORDER WITH ANXIOUS MOOD PHQ9 2/27. Doubt depression. Still mourning from loss of husband. Has support group but not 1 on 1 therapy. Referred to Leonel Ramsay for counseling. Patient to try 1/4 0.5mg  ativan at bedtime to see if she can still sleep with this (to calm anxiety). Eventually trial off of medication-discussed I do not prescribe chronic nightly benzos for sleep-this is an attempted wean.   HYPOTHYROIDISM Last TSH suppressed. Decrease synthroid to (on name brand). F/u testing 6 weeks.   Overactive bladder Trial 2.5 mg BID oxybutynin. May not be clinically effective. SE- dry mouth and constipation. May need to trial myrbetriq.    Refills provided Meds ordered this encounter  Medications  . amLODipine (NORVASC) 2.5 MG tablet    Sig: TAKE ONE TABLET BY MOUTH ONCE DAILY    Dispense:  90 tablet    Refill:  3  . atenolol (TENORMIN) 25 MG tablet    Sig: TAKE ONE TABLET BY MOUTH TWICE DAILY    Dispense:  180 tablet    Refill:  3  . LORazepam (ATIVAN) 0.5 MG tablet    Sig: Take 0.5 tablets (0.25 mg total) by mouth at  bedtime as needed for anxiety. Plan to eventually stop.    Dispense:  30 tablet    Refill:  3  . nitroGLYCERIN (NITROSTAT) 0.4 MG SL tablet    Sig: Place 1 tablet (0.4 mg total) under the tongue every 5 (five) minutes as needed for chest pain.    Dispense:  25 tablet    Refill:  3  . levothyroxine (SYNTHROID, LEVOTHROID) 100 MCG tablet    Sig: Take 1 tablet (100 mcg total) by mouth daily.    Dispense:  60 tablet    Refill:  0  . ranitidine (ZANTAC) 150 MG tablet    Sig: TAKE ONE TABLET BY MOUTH TWICE DAILY AS NEEDED FOR HEARTBURN    Dispense:  180 tablet    Refill:  3

## 2014-08-21 ENCOUNTER — Ambulatory Visit (HOSPITAL_COMMUNITY): Payer: Medicare Other | Attending: Cardiovascular Disease | Admitting: Radiology

## 2014-08-21 VITALS — BP 133/60 | HR 50 | Ht 59.0 in | Wt 140.0 lb

## 2014-08-21 DIAGNOSIS — R0602 Shortness of breath: Secondary | ICD-10-CM

## 2014-08-21 DIAGNOSIS — R079 Chest pain, unspecified: Secondary | ICD-10-CM | POA: Insufficient documentation

## 2014-08-21 DIAGNOSIS — Z8249 Family history of ischemic heart disease and other diseases of the circulatory system: Secondary | ICD-10-CM | POA: Diagnosis not present

## 2014-08-21 DIAGNOSIS — Z8673 Personal history of transient ischemic attack (TIA), and cerebral infarction without residual deficits: Secondary | ICD-10-CM | POA: Insufficient documentation

## 2014-08-21 DIAGNOSIS — I2510 Atherosclerotic heart disease of native coronary artery without angina pectoris: Secondary | ICD-10-CM | POA: Insufficient documentation

## 2014-08-21 DIAGNOSIS — Z9861 Coronary angioplasty status: Secondary | ICD-10-CM | POA: Diagnosis not present

## 2014-08-21 DIAGNOSIS — I779 Disorder of arteries and arterioles, unspecified: Secondary | ICD-10-CM | POA: Insufficient documentation

## 2014-08-21 DIAGNOSIS — R0989 Other specified symptoms and signs involving the circulatory and respiratory systems: Secondary | ICD-10-CM | POA: Insufficient documentation

## 2014-08-21 DIAGNOSIS — R0609 Other forms of dyspnea: Secondary | ICD-10-CM | POA: Diagnosis not present

## 2014-08-21 MED ORDER — REGADENOSON 0.4 MG/5ML IV SOLN
0.4000 mg | Freq: Once | INTRAVENOUS | Status: AC
  Administered 2014-08-21: 0.4 mg via INTRAVENOUS

## 2014-08-21 MED ORDER — TECHNETIUM TC 99M SESTAMIBI GENERIC - CARDIOLITE
33.0000 | Freq: Once | INTRAVENOUS | Status: AC | PRN
Start: 2014-08-21 — End: 2014-08-21
  Administered 2014-08-21: 33 via INTRAVENOUS

## 2014-08-21 MED ORDER — TECHNETIUM TC 99M SESTAMIBI GENERIC - CARDIOLITE
11.0000 | Freq: Once | INTRAVENOUS | Status: AC | PRN
Start: 2014-08-21 — End: 2014-08-21
  Administered 2014-08-21: 11 via INTRAVENOUS

## 2014-08-21 NOTE — Progress Notes (Signed)
[facility] SITE 3 NUCLEAR MED [address] [place], [place] [address] [phone]    Cardiology Nuclear Med Study  Joyce [name] is a 80 y.o. female     MRN : [id number]     DOB: Jun 14, 1931  Procedure Date: 08/21/2014  Nuclear Med Background Indication for Stress Test:  Evaluation for Ischemia and Stent Patency History: CAD, Stent, and 2013 Myocardial Perfusion Imaging-Normal, EF=81% Cardiac Risk Factors: Carotid Disease, CVA, Family History - CAD, Hypertension and Lipids  Symptoms:Chest Pressure with/without exertion (last occurrence last night), DOE   Nuclear Pre-Procedure Caffeine/Decaff Intake:  None NPO After: 7:00am   Lungs:  clear O2 Sat: 98% on room air. IV 0.9% NS with Angio Cath:  22g  IV Site: R Hand  IV Started by:  Bonnita Levan, RN  Chest Size (in):  36 Cup Size: B  Height:  (1.499 m)  Weight:  140 lb (63.504 kg)  BMI:  Body mass index is 28.26 kg/(m^2). Tech Comments:  N/A    Nuclear Med Study 1 or 2 day study: 1 day  Stress Test Type:  Lexiscan  Reading MD: N/A  Order Authorizing Provider:  Tonny Bollman, MD  Resting Radionuclide: Technetium 93m Sestamibi  Resting Radionuclide Dose: 11.0 mCi   Stress Radionuclide:  Technetium 61m Sestamibi  Stress Radionuclide Dose: 33.0 mCi           Stress Protocol Rest HR: 50 Stress HR: 78  Rest BP: 133/60 Stress BP: 152/65  Exercise Time (min): n/a METS: n/a           Dose of Adenosine (mg):  n/a Dose of Lexiscan: 0.4 mg  Dose of Atropine (mg): n/a Dose of Dobutamine: n/a mcg/kg/min (at max HR)  Stress Test Technologist: Irean Hong, RN  Nuclear Technologist:  Harlow Asa, CNMT     Rest Procedure:  Myocardial perfusion imaging was performed at rest 45 minutes following the intravenous administration of Technetium 59m Sestamibi. Rest ECG: Sinus bradycardia, otherwise normal  Stress Procedure:  The patient received IV Lexiscan 0.4 mg over 15-seconds.  Technetium 21m Sestamibi injected  at 30-seconds.  The patient complained of SOB, Epigastric pain, tingling all over, headache, and Chest Pressure with Lexiscan.Quantitative spect images were obtained after a 45 minute delay. Stress ECG: 0.5 mm downsloaping ST depressions in the inferolateral leads  QPS Raw Data Images:  Normal; no motion artifact; normal heart/lung ratio. Stress Images:  There is small area mild perfusion defet in the distal anterior wall and in the true apex. Rest Images:  Normal homogeneous uptake in all areas of the myocardium. Subtraction (SDS):  Mild ischemia in the distal anterior wall and in the true apex (SDS 2).  Transient Ischemic Dilatation (Normal <1.22):  0.98 Lung/Heart Ratio (Normal <0.45):  0.45  Quantitative Gated Spect Images QGS EDV:  51 ml QGS ESV:  15 ml  Impression Exercise Capacity:  Lexiscan with no exercise. BP Response:  Normal blood pressure response. Clinical Symptoms:  Mild chest pain/dyspnea. ECG Impression:  Mild 0.5 mm ST depressions in the inferolateral leads. Comparison with Prior Nuclear Study: No images to compare  Overall Impression:  Low risk stress nuclear study with a small area mild severity ischemia in the distal LAD (SDS 2). .  LV Ejection Fraction: 71%.  LV Wall Motion:  NL LV Function; NL Wall Motion   Lars Masson 08/21/2014

## 2014-09-25 ENCOUNTER — Encounter: Payer: Self-pay | Admitting: Family Medicine

## 2014-09-25 ENCOUNTER — Ambulatory Visit: Payer: Medicare Other | Admitting: Family Medicine

## 2014-09-25 ENCOUNTER — Ambulatory Visit (INDEPENDENT_AMBULATORY_CARE_PROVIDER_SITE_OTHER): Payer: Medicare Other | Admitting: Family Medicine

## 2014-09-25 VITALS — BP 150/60 | Temp 97.9°F | Wt 144.0 lb

## 2014-09-25 DIAGNOSIS — E034 Atrophy of thyroid (acquired): Secondary | ICD-10-CM

## 2014-09-25 DIAGNOSIS — E038 Other specified hypothyroidism: Secondary | ICD-10-CM

## 2014-09-25 DIAGNOSIS — N3281 Overactive bladder: Secondary | ICD-10-CM

## 2014-09-25 DIAGNOSIS — Z23 Encounter for immunization: Secondary | ICD-10-CM

## 2014-09-25 DIAGNOSIS — F4322 Adjustment disorder with anxiety: Secondary | ICD-10-CM

## 2014-09-25 NOTE — Patient Instructions (Addendum)
Try 1/2 pill at lunch and a whole pill at bedtime for overactive bladder of oxybutynin.   Check thyroid today. Hopefully levels ar eback to normal.   Continue ativan 1/4 a pill nightly. Happy to refill as you continue to try to cut down.   Come back for pneumovax in 1 month

## 2014-09-25 NOTE — Progress Notes (Signed)
Tana ConchStephen Hunter, MD Phone: [phone]  Subjective:  Patient presents today to establish care. Chief complaint-noted.   Insomnia-controlled Adjustment disorder with anxious mood-controlled States doing much better since last visit. Some things have settled down at home and she is feeling better. Taking 1/4 of an ativan nightly and forgot one night but still able to sleep. Gets upset when running behind, this is her main anxiety. She was running late today which stressed her out.  She is intermittently tearful due to loneliness or family issues.  Takes 0.25 or 0.125 mg ativan as needed for sleep anxiety related. Does admit to feeling foggy when gets up when on medication.  ROS- No SI/HI  Hypothyroidism-previously overtreated TSH suppressed previously. Decreased synthroid to 100mcg.  ROS- dry mouth better since stress lower but also decreased oxybutynin at same time. Has hard stools but goes daily.   Overactive bladder-mild poor control Oxybutynin 2.5mg  twice a day at decreased from 5mg  BID. Patient rushes to bathroom in day and has a harder time at night.  ROS- denies dizziness or lightheadedness  Patient Active Problem List   Diagnosis Date Noted  . CVA (cerebral infarction) 11/25/2013    Priority: High  . CORONARY ARTERY DISEASE 04/05/2008    Priority: High  . Overactive bladder 08/14/2014    Priority: Medium  . Lumbar pain with radiation down left leg 02/21/2011    Priority: Medium  . HYPERTENSION, MODERATE 01/17/2010    Priority: Medium  . HYPERLIPIDEMIA 01/15/2009    Priority: Medium  . ADJUSTMENT DISORDER WITH ANXIOUS MOOD 01/15/2009    Priority: Medium  . Hypothyroidism 04/05/2008    Priority: Medium  . IRRITABLE BOWEL SYNDROME, HX OF 04/05/2008    Priority: Medium  . CERUMEN IMPACTION, BILATERAL 11/27/2010    Priority: Low  . GERD 04/05/2008    Priority: Low  . INTERNAL HEMORRHOIDS 06/29/2002    Priority: Low   Medications- reviewed and updated Current  Outpatient Prescriptions  Medication Sig Dispense Refill  . amLODipine (NORVASC) 2.5 MG tablet TAKE ONE TABLET BY MOUTH ONCE DAILY  90 tablet  3  . atenolol (TENORMIN) 25 MG tablet TAKE ONE TABLET BY MOUTH TWICE DAILY  180 tablet  3  . Biotin (APPEAREX) 2.5 MG TABS Take 2.5 mg by mouth daily.      Marland Kitchen. FLUARIX QUADRIVALENT 0.5 ML injection Inject 0.5 mLs into the muscle once.       . hyoscyamine (LEVSIN, ANASPAZ) 0.125 MG tablet Take 0.125 mg by mouth every 4 (four) hours as needed.      . INVESTIGATIONAL DRUG SIMPLE RECORD Take 75 mg by mouth daily with breakfast.      . Lactobacillus (ACIDOPHILUS) 10 MG CAPS Take 1 capsule by mouth daily.       Marland Kitchen. levothyroxine (SYNTHROID, LEVOTHROID) 100 MCG tablet Take 1 tablet (100 mcg total) by mouth daily.  60 tablet  0  . LORazepam (ATIVAN) 0.5 MG tablet Take 0.5 tablets (0.25 mg total) by mouth at bedtime as needed for anxiety. Plan to eventually stop.  30 tablet  3  . niacinamide 500 MG tablet Take 500 mg by mouth daily.      Marland Kitchen. omega-3 acid ethyl esters (LOVAZA) 1 G capsule Take 1 g by mouth 2 (two) times daily.      Marland Kitchen. oxybutynin (DITROPAN) 5 MG tablet Take 5 mg by mouth 2 (two) times daily.       . Probiotic Product (ALIGN PO) Take 1 capsule by mouth daily.      .Marland Kitchen  pyridOXINE (B-6) 50 MG tablet Take 50 mg by mouth daily.      . ranitidine (ZANTAC) 150 MG tablet TAKE ONE TABLET BY MOUTH TWICE DAILY AS NEEDED FOR HEARTBURN  180 tablet  3  . vitamin B-12 (CYANOCOBALAMIN) 500 MCG tablet Take 500 mcg by mouth daily.       . vitamin E 400 UNIT capsule Take 400 Units by mouth daily.      . nitroGLYCERIN (NITROSTAT) 0.4 MG SL tablet Place 1 tablet (0.4 mg total) under the tongue every 5 (five) minutes as needed for chest pain.  25 tablet  3   No current facility-administered medications for this visit.    ROS--See HPI   Objective: BP 150/60  Temp(Src) 97.9 F (36.6 C)  Wt 144 lb (65.318 kg) Gen: NAD, resting comfortably in chair HEENT: mucus membranes  moist Neck: no thyromegaly CV: RRR no murmurs rubs or gallops Lungs: CTAB no crackles, wheeze, rhonchi Abdomen: soft/nontender/nondistended.  Ext: trace edema left foot (history of fracture) but not on right (chronic) Skin: warm, dry, no rash  Assessment/Plan:  ADJUSTMENT DISORDER WITH ANXIOUS MOOD Improved control as life issues have improved. Has not seen counselors yet and does not want to at this point. She is doing 1/4 of 0.5mg  ativan for sleep and seems to be sleeping well. She even missed a night and did well. Continue for now but eventually trial off to see if patient truly needs medication.   Hypothyroidism Previously tsh suppressed. Check today. Continue synthroid .   Overactive bladder Poor control but side effects of med improved. Patient would like to trial 2.5mg  in day and 5mg  at night. Agreed to this and continue oxybutynin.   SBP noted to be elevated. Patient admits to stress from being late. Last 2 visits controlled. Repeat next visit.

## 2014-09-25 NOTE — Assessment & Plan Note (Signed)
Previously tsh suppressed. Check today. Continue synthroid .

## 2014-09-25 NOTE — Assessment & Plan Note (Signed)
Poor control but side effects of med improved. Patient would like to trial 2.5mg  in day and 5mg  at night. Agreed to this and continue oxybutynin.

## 2014-09-25 NOTE — Assessment & Plan Note (Signed)
Improved control as life issues have improved. Has not seen counselors yet and does not want to at this point. She is doing 1/4 of 0.5mg  ativan for sleep and seems to be sleeping well. She even missed a night and did well. Continue for now but eventually trial off to see if patient truly needs medication.

## 2014-09-26 ENCOUNTER — Other Ambulatory Visit: Payer: Self-pay | Admitting: Cardiovascular Disease

## 2014-09-26 LAB — TSH: TSH: 0.31 u[IU]/mL — ABNORMAL LOW (ref 0.35–4.50)

## 2014-09-27 ENCOUNTER — Other Ambulatory Visit: Payer: Self-pay | Admitting: *Deleted

## 2014-09-27 MED ORDER — ATENOLOL 25 MG PO TABS: ORAL_TABLET | ORAL | Status: DC

## 2014-10-02 ENCOUNTER — Telehealth: Payer: Self-pay

## 2014-10-02 NOTE — Telephone Encounter (Signed)
Called patient to remind her about her upcoming Research visit tomorrow and to be sure to bring in her study drug.  I had to leave a message on her AM.

## 2014-10-03 ENCOUNTER — Ambulatory Visit (INDEPENDENT_AMBULATORY_CARE_PROVIDER_SITE_OTHER): Payer: Self-pay | Admitting: Neurology

## 2014-10-03 ENCOUNTER — Encounter (INDEPENDENT_AMBULATORY_CARE_PROVIDER_SITE_OTHER): Payer: Self-pay

## 2014-10-03 DIAGNOSIS — Z0289 Encounter for other administrative examinations: Secondary | ICD-10-CM

## 2014-10-03 DIAGNOSIS — Z006 Encounter for examination for normal comparison and control in clinical research program: Secondary | ICD-10-CM

## 2014-10-06 NOTE — Progress Notes (Signed)
Subject was seen in Research for her Visit 4 for [facility] study. The subject was not having any issues. She has been scheduled for her Visit 5 on Jan. 6 at 10:30.

## 2014-10-25 ENCOUNTER — Telehealth: Payer: Self-pay | Admitting: Family Medicine

## 2014-10-25 MED ORDER — LEVOTHYROXINE SODIUM 100 MCG PO TABS: 100.0000 ug | ORAL_TABLET | Freq: Every day | ORAL | Status: DC

## 2014-10-25 NOTE — Telephone Encounter (Signed)
Medication refilled

## 2014-10-25 NOTE — Telephone Encounter (Signed)
[phone] - [place], [place] - [phone]is requesting re-fill on levothyroxine (SYNTHROID, LEVOTHROID) 100 MCG tablet

## 2014-10-28 ENCOUNTER — Other Ambulatory Visit: Payer: Self-pay | Admitting: Family Medicine

## 2014-12-26 ENCOUNTER — Telehealth: Payer: Self-pay | Admitting: Neurology

## 2014-12-26 NOTE — Telephone Encounter (Signed)
Patient called to ask if she could take all of her medicine; including the Investigational Product (IP), as she normally does prior to her Visit 5 tomorrow, [id number] at 10:30h. Research Coordinator, Andi HenceRizwan Sabir, informed her that she could take all of her medicines as she usually does.

## 2014-12-27 ENCOUNTER — Encounter (INDEPENDENT_AMBULATORY_CARE_PROVIDER_SITE_OTHER): Payer: Self-pay

## 2014-12-27 DIAGNOSIS — Z0289 Encounter for other administrative examinations: Secondary | ICD-10-CM

## 2015-01-16 ENCOUNTER — Other Ambulatory Visit: Payer: Self-pay | Admitting: *Deleted

## 2015-01-16 DIAGNOSIS — R079 Chest pain, unspecified: Secondary | ICD-10-CM

## 2015-01-16 MED ORDER — NITROGLYCERIN 0.4 MG SL SUBL: 0.4000 mg | SUBLINGUAL_TABLET | SUBLINGUAL | Status: DC | PRN

## 2015-01-16 MED ORDER — RANITIDINE HCL 150 MG PO TABS: ORAL_TABLET | ORAL | Status: DC

## 2015-01-16 MED ORDER — AMLODIPINE BESYLATE 2.5 MG PO TABS
ORAL_TABLET | ORAL | Status: DC
Start: 2015-01-16 — End: 2015-08-28

## 2015-01-16 MED ORDER — ATENOLOL 25 MG PO TABS: ORAL_TABLET | ORAL | Status: DC

## 2015-01-16 MED ORDER — LORAZEPAM 0.5 MG PO TABS: 0.2500 mg | ORAL_TABLET | Freq: Every evening | ORAL | Status: DC | PRN

## 2015-01-16 MED ORDER — LEVOTHYROXINE SODIUM 100 MCG PO TABS: 100.0000 ug | ORAL_TABLET | Freq: Every day | ORAL | Status: DC

## 2015-01-16 NOTE — Addendum Note (Signed)
Addended by: Alfred LevinsWYRICK, CINDY D on: 01/16/2015 09:43 AM   Modules accepted: Orders

## 2015-01-16 NOTE — Addendum Note (Signed)
Addended by: Alfred LevinsWYRICK, CINDY D on: 01/16/2015 09:33 AM   Modules accepted: Orders

## 2015-03-22 ENCOUNTER — Telehealth: Payer: Self-pay | Admitting: Neurology

## 2015-03-22 NOTE — Telephone Encounter (Signed)
I left a message for the patient to return my call.

## 2015-03-27 ENCOUNTER — Encounter: Payer: Self-pay | Admitting: Family Medicine

## 2015-03-27 ENCOUNTER — Ambulatory Visit (INDEPENDENT_AMBULATORY_CARE_PROVIDER_SITE_OTHER): Payer: Medicare Other | Admitting: Family Medicine

## 2015-03-27 VITALS — BP 138/82 | HR 60 | Temp 98.4°F | Wt 147.0 lb

## 2015-03-27 DIAGNOSIS — Z23 Encounter for immunization: Secondary | ICD-10-CM | POA: Diagnosis not present

## 2015-03-27 DIAGNOSIS — M545 Low back pain: Secondary | ICD-10-CM | POA: Diagnosis not present

## 2015-03-27 DIAGNOSIS — E034 Atrophy of thyroid (acquired): Secondary | ICD-10-CM | POA: Diagnosis not present

## 2015-03-27 DIAGNOSIS — E038 Other specified hypothyroidism: Secondary | ICD-10-CM

## 2015-03-27 DIAGNOSIS — I2510 Atherosclerotic heart disease of native coronary artery without angina pectoris: Secondary | ICD-10-CM | POA: Diagnosis not present

## 2015-03-27 DIAGNOSIS — I10 Essential (primary) hypertension: Secondary | ICD-10-CM

## 2015-03-27 DIAGNOSIS — F4322 Adjustment disorder with anxiety: Secondary | ICD-10-CM

## 2015-03-27 DIAGNOSIS — M79605 Pain in left leg: Secondary | ICD-10-CM

## 2015-03-27 LAB — TSH: TSH: 0.62 u[IU]/mL (ref 0.35–4.50)

## 2015-03-27 NOTE — Assessment & Plan Note (Signed)
Control on repeat. Continue atenolol and amlodipine

## 2015-03-27 NOTE — Patient Instructions (Addendum)
Prevnar today  Forms completed for handicap and scat bus  Check thyroid on your way out  Let's check back in 3 months from now

## 2015-03-27 NOTE — Assessment & Plan Note (Signed)
Stable pain pattern. Pain does limit her ability to walk long distances. I agree with the need for the handicap placard and scat bus transportation

## 2015-03-27 NOTE — Assessment & Plan Note (Addendum)
Asymptomatic since her stress test. Unfortunately statin intolerant. She is in a study and unsure on Plavix or aspirin. I do think she needs scat bus assistance due to her shortness of breath with exertion not recently changed and age and living alone. Forms completed.

## 2015-03-27 NOTE — Progress Notes (Signed)
Tana ConchStephen Lamari Beckles, MD Phone: [phone]  Subjective:   Joyce AlarJennie G [name] is a 81 y.o. year old very pleasant female patient who presents with the following:  Patient requests handicap placard and scat bus forms completed. She does not drive but uses the handicap placard when she rides with someone else.   Her limitations are primarily due to lumbar pain with radiation down her left leg due to a "pinched nerve". Former MRI. She does have numbness in left foot at times. Denies falls or leg weakness.   She also has CAD. She denies chest pain but does have shortness of breath when walking about a block which is unchanged over the last few years. She had a stress test as recently as last year which was low risk. She has not used nitroglycerin since her stress test.   Hypertension-controlled on recheck  BP Readings from Last 3 Encounters:  03/27/15 138/82  09/25/14 150/60  08/21/14 133/60   Home BP monitoring-no Compliant with medications-yes without side effects  Anxiety/history depressed mood- reasonable control.  1/4 of a 1/4 ativan. We discussed weaning down due to her concerns of imbalance at times and also intermittent confusion.   ROS-Denies any CP, HA, SOB, blurry vision, LE edema. Denies fecal incontinence. History overactive bladder. No SI HI  Past Medical History- Patient Active Problem List   Diagnosis Date Noted  . CVA (cerebral infarction) 11/25/2013    Priority: High  . CAD (coronary artery disease) 04/05/2008    Priority: High  . Overactive bladder 08/14/2014    Priority: Medium  . Lumbar pain with radiation down left leg 02/21/2011    Priority: Medium  . Essential hypertension 01/17/2010    Priority: Medium  . HYPERLIPIDEMIA 01/15/2009    Priority: Medium  . ADJUSTMENT DISORDER WITH ANXIOUS MOOD 01/15/2009    Priority: Medium  . Hypothyroidism 04/05/2008    Priority: Medium  . IRRITABLE BOWEL SYNDROME, HX OF 04/05/2008    Priority: Medium  . CERUMEN IMPACTION,  BILATERAL 11/27/2010    Priority: Low  . GERD 04/05/2008    Priority: Low  . INTERNAL HEMORRHOIDS 06/29/2002    Priority: Low     Medications- reviewed and updated Current Outpatient Prescriptions  Medication Sig Dispense Refill  . amLODipine (NORVASC) 2.5 MG tablet TAKE ONE TABLET BY MOUTH ONCE DAILY 90 tablet 3  . atenolol (TENORMIN) 25 MG tablet TAKE ONE TABLET BY MOUTH TWICE DAILY 180 tablet 3  . Biotin (APPEAREX) 2.5 MG TABS Take 2.5 mg by mouth daily.    . INVESTIGATIONAL DRUG SIMPLE RECORD Take 75 mg by mouth daily with breakfast.    . Lactobacillus (ACIDOPHILUS) 10 MG CAPS Take 1 capsule by mouth daily.     Marland Kitchen. levothyroxine (SYNTHROID, LEVOTHROID) 100 MCG tablet Take 1 tablet (100 mcg total) by mouth daily. 180 tablet 3  . niacinamide 500 MG tablet Take 500 mg by mouth daily.    Marland Kitchen. omega-3 acid ethyl esters (LOVAZA) 1 G capsule Take 1 g by mouth 2 (two) times daily.    Marland Kitchen. oxybutynin (DITROPAN) 5 MG tablet Take 5 mg by mouth 2 (two) times daily.     . Probiotic Product (ALIGN PO) Take 1 capsule by mouth daily.    Marland Kitchen. pyridOXINE (B-6) 50 MG tablet Take 50 mg by mouth daily.    . ranitidine (ZANTAC) 150 MG tablet TAKE ONE TABLET BY MOUTH TWICE DAILY AS NEEDED FOR HEARTBURN 180 tablet 3  . vitamin B-12 (CYANOCOBALAMIN) 500 MCG tablet Take 500 mcg by  mouth daily.     . vitamin E 400 UNIT capsule Take 400 Units by mouth daily.    . hyoscyamine (LEVSIN, ANASPAZ) 0.125 MG tablet Take 0.125 mg by mouth every 4 (four) hours as needed.    Marland Kitchen LORazepam (ATIVAN) 0.5 MG tablet Take 0.5 tablets (0.25 mg total) by mouth at bedtime as needed for anxiety. (Patient not taking: Reported on 03/27/2015) 45 tablet 1  . nitroGLYCERIN (NITROSTAT) 0.4 MG SL tablet Place 1 tablet (0.4 mg total) under the tongue every 5 (five) minutes as needed for chest pain. (Patient not taking: Reported on 03/27/2015) 25 tablet 3   Objective: BP 138/82 mmHg  Pulse 60  Temp(Src) 98.4 F (36.9 C)  Wt 147 lb (66.679 kg) Gen:  NAD, resting comfortably in chair CV: RRR no murmurs rubs or gallops Lungs: CTAB no crackles, wheeze, rhonchi Abdomen: soft/nontender/nondistended/normal bowel sounds.  Ext: no edema Skin: warm, dry, no rash   Assessment/Plan:  CAD (coronary artery disease) Asymptomatic since her stress test. Unfortunately statin intolerant. She is in a study and unsure on Plavix or aspirin. I do think she needs scat bus assistance due to her shortness of breath with exertion not recently changed and age and living alone. Forms completed.    Lumbar pain with radiation down left leg Stable pain pattern. Pain does limit her ability to walk long distances. I agree with the need for the handicap placard and scat bus transportation   Essential hypertension Control on repeat. Continue atenolol and amlodipine   ADJUSTMENT DISORDER WITH ANXIOUS MOOD Due to fall risk we continue to discuss cutting down on her Ativan. She is currently taking a quarter of a quarter pill approximately 0.125 mg.   3 month follow up.   Orders Placed This Encounter  Procedures  . Pneumococcal conjugate vaccine 13-valent IM   TSH also wnl, continue levothyroxine.  Results for orders placed or performed in visit on 03/27/15 (from the past 24 hour(s))  TSH     Status: None   Collection Time: 03/27/15 10:29 AM  Result Value Ref Range   TSH 0.62 0.35 - 4.50 uIU/mL

## 2015-03-27 NOTE — Assessment & Plan Note (Signed)
Due to fall risk we continue to discuss cutting down on her Ativan. She is currently taking a quarter of a quarter pill approximately 0.125 mg.

## 2015-03-30 ENCOUNTER — Encounter (INDEPENDENT_AMBULATORY_CARE_PROVIDER_SITE_OTHER): Payer: Self-pay

## 2015-03-30 DIAGNOSIS — Z0289 Encounter for other administrative examinations: Secondary | ICD-10-CM

## 2015-04-17 ENCOUNTER — Telehealth: Payer: Self-pay | Admitting: Neurology

## 2015-04-17 NOTE — Telephone Encounter (Signed)
I spoke to the patient to confirm whether she had received the Trial Identification Card for the Respect-Esus trial.

## 2015-04-17 NOTE — Telephone Encounter (Signed)
I spoke to the patient about retrieving the Trial Identification Card (TIC) for the Respect-Esus trial. Patient will give card to her daughter. The daughter will bring it to the office on [id number] between 15:00h and 17:00h. I explained to the patient that I will provide her daughter with a new TIC, which the daughter will then give to the patient.

## 2015-04-18 ENCOUNTER — Telehealth: Payer: Self-pay | Admitting: Neurology

## 2015-04-18 NOTE — Telephone Encounter (Signed)
I spoke to the patient to confirm that she had receive the Trial Identification Card (TIC) (version 2.0) for the Respect-ESUS trial. She confirmed that she had received it. I suggested to place [facility] card [facility] and keep it with her at all times.

## 2015-05-16 ENCOUNTER — Encounter (HOSPITAL_COMMUNITY): Payer: Self-pay | Admitting: Cardiology

## 2015-05-16 ENCOUNTER — Inpatient Hospital Stay (HOSPITAL_COMMUNITY): Payer: Medicare Other

## 2015-05-16 ENCOUNTER — Emergency Department (HOSPITAL_COMMUNITY): Payer: Medicare Other

## 2015-05-16 ENCOUNTER — Observation Stay (HOSPITAL_COMMUNITY)
Admission: EM | Admit: 2015-05-16 | Discharge: 2015-05-18 | Disposition: A | Discharge status: Home / Self Care | Payer: Medicare Other | Attending: Internal Medicine | Admitting: Internal Medicine

## 2015-05-16 DIAGNOSIS — R4781 Slurred speech: Secondary | ICD-10-CM | POA: Diagnosis not present

## 2015-05-16 DIAGNOSIS — N3281 Overactive bladder: Secondary | ICD-10-CM | POA: Diagnosis not present

## 2015-05-16 DIAGNOSIS — Z888 Allergy status to other drugs, medicaments and biological substances status: Secondary | ICD-10-CM | POA: Diagnosis not present

## 2015-05-16 DIAGNOSIS — E034 Atrophy of thyroid (acquired): Secondary | ICD-10-CM | POA: Diagnosis not present

## 2015-05-16 DIAGNOSIS — G458 Other transient cerebral ischemic attacks and related syndromes: Secondary | ICD-10-CM | POA: Diagnosis not present

## 2015-05-16 DIAGNOSIS — G4730 Sleep apnea, unspecified: Secondary | ICD-10-CM | POA: Insufficient documentation

## 2015-05-16 DIAGNOSIS — Z87891 Personal history of nicotine dependence: Secondary | ICD-10-CM | POA: Diagnosis not present

## 2015-05-16 DIAGNOSIS — K219 Gastro-esophageal reflux disease without esophagitis: Secondary | ICD-10-CM | POA: Diagnosis not present

## 2015-05-16 DIAGNOSIS — Z886 Allergy status to analgesic agent status: Secondary | ICD-10-CM | POA: Diagnosis not present

## 2015-05-16 DIAGNOSIS — Z66 Do not resuscitate: Secondary | ICD-10-CM | POA: Diagnosis not present

## 2015-05-16 DIAGNOSIS — I6789 Other cerebrovascular disease: Secondary | ICD-10-CM | POA: Diagnosis not present

## 2015-05-16 DIAGNOSIS — I10 Essential (primary) hypertension: Secondary | ICD-10-CM | POA: Insufficient documentation

## 2015-05-16 DIAGNOSIS — R29810 Facial weakness: Secondary | ICD-10-CM | POA: Diagnosis not present

## 2015-05-16 DIAGNOSIS — Z880 Allergy status to penicillin: Secondary | ICD-10-CM | POA: Insufficient documentation

## 2015-05-16 DIAGNOSIS — Z881 Allergy status to other antibiotic agents status: Secondary | ICD-10-CM | POA: Diagnosis not present

## 2015-05-16 DIAGNOSIS — Z8673 Personal history of transient ischemic attack (TIA), and cerebral infarction without residual deficits: Secondary | ICD-10-CM | POA: Diagnosis present

## 2015-05-16 DIAGNOSIS — I2510 Atherosclerotic heart disease of native coronary artery without angina pectoris: Secondary | ICD-10-CM

## 2015-05-16 DIAGNOSIS — G459 Transient cerebral ischemic attack, unspecified: Secondary | ICD-10-CM | POA: Diagnosis not present

## 2015-05-16 DIAGNOSIS — K589 Irritable bowel syndrome without diarrhea: Secondary | ICD-10-CM | POA: Diagnosis not present

## 2015-05-16 DIAGNOSIS — I639 Cerebral infarction, unspecified: Secondary | ICD-10-CM

## 2015-05-16 DIAGNOSIS — E038 Other specified hypothyroidism: Secondary | ICD-10-CM

## 2015-05-16 DIAGNOSIS — M419 Scoliosis, unspecified: Secondary | ICD-10-CM | POA: Diagnosis not present

## 2015-05-16 DIAGNOSIS — M5416 Radiculopathy, lumbar region: Secondary | ICD-10-CM | POA: Insufficient documentation

## 2015-05-16 DIAGNOSIS — N393 Stress incontinence (female) (male): Secondary | ICD-10-CM | POA: Diagnosis not present

## 2015-05-16 DIAGNOSIS — E039 Hypothyroidism, unspecified: Secondary | ICD-10-CM | POA: Insufficient documentation

## 2015-05-16 DIAGNOSIS — M545 Low back pain, unspecified: Secondary | ICD-10-CM | POA: Diagnosis present

## 2015-05-16 DIAGNOSIS — I517 Cardiomegaly: Secondary | ICD-10-CM | POA: Diagnosis not present

## 2015-05-16 DIAGNOSIS — E785 Hyperlipidemia, unspecified: Secondary | ICD-10-CM | POA: Diagnosis not present

## 2015-05-16 DIAGNOSIS — R200 Anesthesia of skin: Secondary | ICD-10-CM | POA: Diagnosis not present

## 2015-05-16 DIAGNOSIS — M79605 Pain in left leg: Secondary | ICD-10-CM

## 2015-05-16 LAB — CBC
HCT: 44.4 % (ref 36.0–46.0)
Hemoglobin: 15.5 g/dL — ABNORMAL HIGH (ref 12.0–15.0)
MCH: 30.4 pg (ref 26.0–34.0)
MCHC: 34.9 g/dL (ref 30.0–36.0)
MCV: 87.1 fL (ref 78.0–100.0)
Platelets: 176 10*3/uL (ref 150–400)
RBC: 5.1 MIL/uL (ref 3.87–5.11)
RDW: 13.2 % (ref 11.5–15.5)
WBC: 5.4 10*3/uL (ref 4.0–10.5)

## 2015-05-16 LAB — RAPID URINE DRUG SCREEN, HOSP PERFORMED
Amphetamines: NOT DETECTED
Barbiturates: NOT DETECTED
Benzodiazepines: NOT DETECTED
COCAINE: NOT DETECTED
OPIATES: NOT DETECTED
Tetrahydrocannabinol: NOT DETECTED

## 2015-05-16 LAB — URINALYSIS, ROUTINE W REFLEX MICROSCOPIC
BILIRUBIN URINE: NEGATIVE
Glucose, UA: NEGATIVE mg/dL
Hgb urine dipstick: NEGATIVE
Ketones, ur: NEGATIVE mg/dL
LEUKOCYTES UA: NEGATIVE
Nitrite: NEGATIVE
Protein, ur: NEGATIVE mg/dL
SPECIFIC GRAVITY, URINE: 1.008 (ref 1.005–1.030)
UROBILINOGEN UA: 0.2 mg/dL (ref 0.0–1.0)
pH: 8 (ref 5.0–8.0)

## 2015-05-16 LAB — I-STAT TROPONIN, ED: Troponin i, poc: 0.01 ng/mL (ref 0.00–0.08)

## 2015-05-16 LAB — DIFFERENTIAL
Basophils Absolute: 0 10*3/uL (ref 0.0–0.1)
Basophils Relative: 1 % (ref 0–1)
Eosinophils Absolute: 0.1 10*3/uL (ref 0.0–0.7)
Eosinophils Relative: 2 % (ref 0–5)
LYMPHS ABS: 2.3 10*3/uL (ref 0.7–4.0)
LYMPHS PCT: 43 % (ref 12–46)
Monocytes Absolute: 0.5 10*3/uL (ref 0.1–1.0)
Monocytes Relative: 9 % (ref 3–12)
Neutro Abs: 2.5 10*3/uL (ref 1.7–7.7)
Neutrophils Relative %: 45 % (ref 43–77)

## 2015-05-16 LAB — I-STAT CHEM 8, ED
BUN: 10 mg/dL (ref 6–20)
Calcium, Ion: 1.18 mmol/L (ref 1.13–1.30)
Chloride: 99 mmol/L — ABNORMAL LOW (ref 101–111)
Creatinine, Ser: 0.6 mg/dL (ref 0.44–1.00)
GLUCOSE: 125 mg/dL — AB (ref 65–99)
HEMATOCRIT: 50 % — AB (ref 36.0–46.0)
HEMOGLOBIN: 17 g/dL — AB (ref 12.0–15.0)
Potassium: 4 mmol/L (ref 3.5–5.1)
Sodium: 138 mmol/L (ref 135–145)
TCO2: 22 mmol/L (ref 0–100)

## 2015-05-16 LAB — COMPREHENSIVE METABOLIC PANEL
ALBUMIN: 3.8 g/dL (ref 3.5–5.0)
ALT: 23 U/L (ref 14–54)
ANION GAP: 10 (ref 5–15)
AST: 28 U/L (ref 15–41)
Alkaline Phosphatase: 57 U/L (ref 38–126)
BUN: 9 mg/dL (ref 6–20)
CO2: 26 mmol/L (ref 22–32)
CREATININE: 0.62 mg/dL (ref 0.44–1.00)
Calcium: 9.3 mg/dL (ref 8.9–10.3)
Chloride: 101 mmol/L (ref 101–111)
GFR calc Af Amer: >60 mL/min (ref >60)
GFR calc non Af Amer: >60 mL/min (ref >60)
GLUCOSE: 121 mg/dL — AB (ref 65–99)
Potassium: 4.1 mmol/L (ref 3.5–5.1)
SODIUM: 137 mmol/L (ref 135–145)
TOTAL PROTEIN: 7.8 g/dL (ref 6.5–8.1)
Total Bilirubin: 1.7 mg/dL — ABNORMAL HIGH (ref 0.3–1.2)

## 2015-05-16 LAB — APTT: aPTT: 27 seconds (ref 24–37)

## 2015-05-16 LAB — PROTIME-INR
INR: 1.03 (ref 0.00–1.49)
PROTHROMBIN TIME: 13.7 s (ref 11.6–15.2)

## 2015-05-16 LAB — ETHANOL: Alcohol, Ethyl (B): <5 mg/dL (ref <5)

## 2015-05-16 LAB — CBG MONITORING, ED: Glucose-Capillary: 126 mg/dL — ABNORMAL HIGH (ref 65–99)

## 2015-05-16 MED ORDER — LORAZEPAM 0.5 MG PO TABS
0.2500 mg | ORAL_TABLET | Freq: Every evening | ORAL | Status: DC | PRN
  Administered 2015-05-16 – 2015-05-17 (×2): 0.25 mg via ORAL
  Filled 2015-05-16 (×2): qty 1

## 2015-05-16 MED ORDER — AMLODIPINE BESYLATE 2.5 MG PO TABS: 2.5000 mg | ORAL_TABLET | Freq: Every day | ORAL | Status: DC

## 2015-05-16 MED ORDER — NON FORMULARY: 150.0000 mg | Freq: Two times a day (BID) | Status: DC

## 2015-05-16 MED ORDER — SENNOSIDES-DOCUSATE SODIUM 8.6-50 MG PO TABS: 1.0000 | ORAL_TABLET | Freq: Every evening | ORAL | Status: DC | PRN

## 2015-05-16 MED ORDER — VITAMIN B-6 50 MG PO TABS
50.0000 mg | ORAL_TABLET | Freq: Every day | ORAL | Status: DC
  Administered 2015-05-17 – 2015-05-18 (×2): 50 mg via ORAL
  Filled 2015-05-16 (×2): qty 1

## 2015-05-16 MED ORDER — NIACIN ER 500 MG PO CPCR
500.0000 mg | ORAL_CAPSULE | Freq: Every day | ORAL | Status: DC
  Filled 2015-05-16 (×2): qty 1

## 2015-05-16 MED ORDER — NIACINAMIDE 500 MG PO TABS: 500.0000 mg | ORAL_TABLET | Freq: Every day | ORAL | Status: DC

## 2015-05-16 MED ORDER — LEVOTHYROXINE SODIUM 100 MCG PO TABS
100.0000 ug | ORAL_TABLET | Freq: Every day | ORAL | Status: DC
  Administered 2015-05-17 – 2015-05-18 (×2): 100 ug via ORAL
  Filled 2015-05-16 (×2): qty 1

## 2015-05-16 MED ORDER — RANITIDINE HCL 150 MG PO TABS
150.0000 mg | ORAL_TABLET | Freq: Two times a day (BID) | ORAL | Status: DC
  Administered 2015-05-16 – 2015-05-18 (×4): 150 mg via ORAL
  Filled 2015-05-16 (×5): qty 1

## 2015-05-16 MED ORDER — LABETALOL HCL 5 MG/ML IV SOLN
INTRAVENOUS | Status: AC
  Filled 2015-05-16: qty 4

## 2015-05-16 MED ORDER — OMEGA-3-ACID ETHYL ESTERS 1 G PO CAPS
2.0000 g | ORAL_CAPSULE | Freq: Every day | ORAL | Status: DC
  Administered 2015-05-17 – 2015-05-18 (×2): 2 g via ORAL
  Filled 2015-05-16 (×2): qty 2

## 2015-05-16 MED ORDER — STUDY - INVESTIGATIONAL DRUG SIMPLE RECORD: 75.0000 mg | Freq: Every day | Status: DC

## 2015-05-16 MED ORDER — STROKE: EARLY STAGES OF RECOVERY BOOK: Freq: Once | Status: DC

## 2015-05-16 MED ORDER — ACETAMINOPHEN 325 MG PO TABS: 650.0000 mg | ORAL_TABLET | ORAL | Status: DC | PRN

## 2015-05-16 MED ORDER — CYANOCOBALAMIN 500 MCG PO TABS
500.0000 ug | ORAL_TABLET | Freq: Every day | ORAL | Status: DC
  Administered 2015-05-17 – 2015-05-18 (×2): 500 ug via ORAL
  Filled 2015-05-16 (×2): qty 1

## 2015-05-16 MED ORDER — ATENOLOL 25 MG PO TABS
25.0000 mg | ORAL_TABLET | Freq: Every day | ORAL | Status: DC
Start: 2015-05-17 — End: 2015-05-18
  Administered 2015-05-17 – 2015-05-18 (×2): 25 mg via ORAL
  Filled 2015-05-16 (×2): qty 1

## 2015-05-16 MED ORDER — OXYBUTYNIN CHLORIDE 5 MG PO TABS
5.0000 mg | ORAL_TABLET | Freq: Two times a day (BID) | ORAL | Status: DC
  Administered 2015-05-16: 5 mg via ORAL
  Administered 2015-05-17: 2.5 mg via ORAL
  Administered 2015-05-17 – 2015-05-18 (×2): 5 mg via ORAL
  Filled 2015-05-16 (×4): qty 1

## 2015-05-16 MED ORDER — FAMOTIDINE 20 MG PO TABS: 20.0000 mg | ORAL_TABLET | Freq: Two times a day (BID) | ORAL | Status: DC

## 2015-05-16 MED ORDER — ACETAMINOPHEN 650 MG RE SUPP: 650.0000 mg | RECTAL | Status: DC | PRN

## 2015-05-16 MED ORDER — HEPARIN SODIUM (PORCINE) 5000 UNIT/ML IJ SOLN
5000.0000 [IU] | Freq: Three times a day (TID) | INTRAMUSCULAR | Status: DC
  Administered 2015-05-16: 5000 [IU] via SUBCUTANEOUS
  Filled 2015-05-16: qty 1

## 2015-05-16 NOTE — ED Notes (Signed)
Checked patient cbg it was 126 notified RN of blood sugar 

## 2015-05-16 NOTE — Consult Note (Signed)
Neurology Consultation Reason for Consult: Stroke Referring Physician: Judd Lienelo, D  CC: Stroke  History is obtained from: Patient  HPI: Joyce [name] is a 81 y.o. female with a history of TIA two years ago who was talking on the phone at approximately 11:25am when she had sudden onset of slurred speech and right sided numbness. She was brought to the ER via EMS where a prominent right facial droop and severe dysarthria were observed.   tPA was initially discussed, however the patient had improvement in her symptoms to the point that her symptoms were non-disabling and therefore   LKW: 11:25am tpa given?: no, rapidly improving symptoms.   ROS: A 14 point ROS was performed and is negative except as noted in the HPI.    Past Medical History  Diagnosis Date  . Unspecified essential hypertension   . Hyperlipidemia   . GERD (gastroesophageal reflux disease)   . Candidiasis of skin and nails   . Internal hemorrhoids without mention of complication   . Hypothyroidism   . IBS (irritable bowel syndrome)   . Coronary atherosclerosis of unspecified type of vessel, native or graft CARDIOLOGIST- [facility]    DENIES S & S  . Borderline glaucoma   . Adjustment disorder with anxiety STRESS CHEST DISCOMFORT    PT RECENTLY PUT HUSBAND IN NURSING HOME  . History of sleep apnea YRS AGO ON CPAP UNTIL LOST WT  . Pinched nerve LUMBAR    RESIDUAL LEFT FOOT NUMBNESS/ TINGLING  . Scoliosis   . Numbness and tingling of foot LEFT -- SECONDARY TO PINCHED LUMBAR NERVE  . Nocturia   . Stress incontinence   . Lesion of bladder   . Sinusitis 09/29/2013    Family History: CAD  Social History: Tob: denies  Exam: Current vital signs: BP 194/57 mmHg  Pulse 60  Resp 21  Wt 67.2 kg (148 lb 2.4 oz)  SpO2 96% Vital signs in last 24 hours: Pulse Rate:  [60] 60 (05/25 1300) Resp:  [16-21] 21 (05/25 1300) BP: (194-209)/(57-61) 194/57 mmHg (05/25 1300) SpO2:  [95 %-96 %] 96 % (05/25 1300) Weight:  [67.2  kg (148 lb 2.4 oz)] 67.2 kg (148 lb 2.4 oz) (05/25 1246)  Physical Exam  Constitutional: Appears well-developed and well-nourished.  Psych: Affect appropriate to situation Eyes: No scleral injection HENT: No OP obstrucion Head: Normocephalic.  Cardiovascular: Normal rate and regular rhythm.  Respiratory: Effort normal  GI: Soft.  No distension. There is no tenderness.  Skin: WDI  Neuro: Mental Status: Patient is awake, alert, oriented to person, place, month, year, and situation. Patient is able to give a clear and coherent history. No signs of aphasia or neglect Severe dysarthria Cranial Nerves: II: Visual Fields are full. Pupils are equal, round, and reactive to light.   III,IV, VI: EOMI without ptosis or diploplia.  V: Facial sensation is symmetric to temperature VII: Prominent right lower facial droop. VIII: hearing is intact to voice X: Uvula elevates symmetrically XI: Shoulder shrug is symmetric. XII: tongue is midline without atrophy or fasciculations.  Motor: Tone is normal. Bulk is normal. 5/5 strength was present in all four extremities, but with mild drift initially in the right arm that subsequently cleared Sensory: Sensation is decreased to pin on the right side.   Cerebellar: FNF and HKS are intact bilaterally  I have reviewed labs in epic and the results pertinent to this consultation are: cmp - unremarkable  I have reviewed the images obtained:CT head - negative.  Impression: 81 yo F with likely TIA as her symptoms are rapidly improving. She is on a study medication, and if MRI shows stroke, then I think that this is an endpoint for the study, but TIA is not.   Recommendations: 1. HgbA1c, fasting lipid panel 2. MRI, MRA  of the brain without contrast 3. Frequent neuro checks 4. Echocardiogram 5. Carotid dopplers 6. Prophylactic therapy- continue study medication unless MRI shows ischemia, then ASA  daily.  7. Risk factor modification 8.  Telemetry monitoring   Ritta Slot, MD [facility] [phone]  If 7pm- 7am, please page neurology on call as listed in AMION.

## 2015-05-16 NOTE — ED Provider Notes (Signed)
CSN: [id number]     Arrival date & time 05/16/15  1222 History   First MD Initiated Contact with Patient 05/16/15 1231     Chief Complaint  Patient presents with  . Code Stroke    An emergency department physician performed an initial assessment on this suspected stroke patient at [id number] (Chett Taniguchi). (Consider location/radiation/quality/duration/timing/severity/associated sxs/prior Treatment) HPI Comments: Patient is an 81 year old female with past medical history of coronary artery disease, hypothyroidism, high cholesterol. She presents by EMS for evaluation of possible CVA. She reports at approximately 1125 she began with right-sided facial numbness and difficulty speaking. Her family noticed a facial droop and she was brought here by EMS for evaluation of this. She denies any headache. She denies any injury or trauma.  The history is provided by the patient and the EMS personnel.    Past Medical History  Diagnosis Date  . Unspecified essential hypertension   . Hyperlipidemia   . GERD (gastroesophageal reflux disease)   . Candidiasis of skin and nails   . Internal hemorrhoids without mention of complication   . Hypothyroidism   . IBS (irritable bowel syndrome)   . Coronary atherosclerosis of unspecified type of vessel, native or graft [facility]ARDIOLOGIST[facility]    DENIES S & S  . Borderline glaucoma   . Adjustment disorder with anxiety STRESS CHEST DISCOMFORT    PT RECENTLY PUT HUSBAND IN NURSING HOME  . History of sleep apnea YRS AGO ON CPAP UNTIL LOST WT  . Pinched nerve LUMBAR    RESIDUAL LEFT FOOT NUMBNESS/ TINGLING  . Scoliosis   . Numbness and tingling of foot LEFT -- SECONDARY TO PINCHED LUMBAR NERVE  . Nocturia   . Stress incontinence   . Lesion of bladder   . Sinusitis 09/29/2013   Past Surgical History  Procedure Laterality Date  . Vaginal hysterectomy  1968  . Cholecystectomy  1976  . Cataract extraction w/ intraocular lens  implant, bilateral    . Cardiac  catheterization  01-10-2003   DR Chrissie NoaWILLIAM DOWNEY    PATENT CIRCUMFLEX STENT/ MODERATE CAD ELSEWHERE  . Coronary angioplasty with stent placement  08-31-2001   DR BRUCE BRODIE    STENTING OF PROXIMAL CIRCUMFLEX (75% TO 0%)   . Cardiovascular stress test  07-27-2012    NORMAL NUCLEAR STUDY/ LVEF 81%  . Cystoscopy with biopsy  08/02/2012    Procedure: CYSTOSCOPY WITH BIOPSY;  Surgeon: Milford Cageaniel Young Woodruff, MD;  [facility];  Service: Urology;  Laterality: N/A;   Family History  Problem Relation Age of Onset  . Heart disease Mother     per patient died of old age at 3385  . CAD Father     massive MI age 79   History  Substance Use Topics  . Smoking status: Former Smoker -- 1.50 packs/day for 20 years    Types: Cigarettes    Quit date: 07/31/1983  . Smokeless tobacco: Never Used  . Alcohol Use: No   OB History    No data available     Review of Systems  All other systems reviewed and are negative.     Allergies  Loratadine; Adhesive; Aspirin; Levaquin; Levofloxacin; Lumigan; Mobic; Penicillins; and Statins  Home Medications   Prior to Admission medications   Medication Sig Start Date End Date Taking? Authorizing Provider  amLODipine (NORVASC) 2.5 MG tablet TAKE ONE TABLET BY MOUTH ONCE DAILY 01/16/15   Shelva MajesticStephen O Hunter, MD  atenolol (TENORMIN) 25 MG tablet TAKE ONE TABLET BY  MOUTH TWICE DAILY 01/16/15   Shelva Majestic, MD  Biotin (APPEAREX) 2.5 MG TABS Take 2.5 mg by mouth daily.    Historical Provider, MD  hyoscyamine (LEVSIN, ANASPAZ) 0.125 MG tablet Take 0.125 mg by mouth every 4 (four) hours as needed. 08/02/12   Natalia Leatherwood, MD  INVESTIGATIONAL DRUG SIMPLE RECORD Take 75 mg by mouth daily with breakfast. 11/27/13   Meredeth Ide, MD  Lactobacillus (ACIDOPHILUS) 10 MG CAPS Take 1 capsule by mouth daily.     Historical Provider, MD  levothyroxine (SYNTHROID, LEVOTHROID) 100 MCG tablet Take 1 tablet (100 mcg total) by mouth daily. 01/16/15   Shelva Majestic, MD  LORazepam (ATIVAN) 0.5 MG tablet Take 0.5 tablets (0.25 mg total) by mouth at bedtime as needed for anxiety. Patient not taking: Reported on 03/27/2015 01/16/15   Shelva Majestic, MD  niacinamide 500 MG tablet Take 500 mg by mouth daily.    Historical Provider, MD  nitroGLYCERIN (NITROSTAT) 0.4 MG SL tablet Place 1 tablet (0.4 mg total) under the tongue every 5 (five) minutes as needed for chest pain. Patient not taking: Reported on 03/27/2015 01/16/15   Shelva Majestic, MD  omega-3 acid ethyl esters (LOVAZA) 1 G capsule Take 1 g by mouth 2 (two) times daily.    Historical Provider, MD  oxybutynin (DITROPAN) 5 MG tablet Take 5 mg by mouth 2 (two) times daily.     Historical Provider, MD  Probiotic Product (ALIGN PO) Take 1 capsule by mouth daily.    Historical Provider, MD  pyridOXINE (B-6) 50 MG tablet Take 50 mg by mouth daily.    Historical Provider, MD  ranitidine (ZANTAC) 150 MG tablet TAKE ONE TABLET BY MOUTH TWICE DAILY AS NEEDED FOR HEARTBURN 01/16/15   Shelva Majestic, MD  vitamin B-12 (CYANOCOBALAMIN) 500 MCG tablet Take 500 mcg by mouth daily.     Historical Provider, MD  vitamin E 400 UNIT capsule Take 400 Units by mouth daily.    Historical Provider, MD   BP 194/57 mmHg  Pulse 60  Resp 21  Wt 148 lb 2.4 oz (67.2 kg)  SpO2 96% Physical Exam  Constitutional: She is oriented to person, place, and time. She appears well-developed and well-nourished. No distress.  HENT:  Head: Normocephalic and atraumatic.  Neck: Normal range of motion. Neck supple.  Cardiovascular: Normal rate and regular rhythm.  Exam reveals no gallop and no friction rub.   No murmur heard. Pulmonary/Chest: Effort normal and breath sounds normal. No respiratory distress. She has no wheezes.  Abdominal: Soft. Bowel sounds are normal. She exhibits no distension. There is no tenderness.  Musculoskeletal: Normal range of motion.  Neurological: She is alert and oriented to person, place, and time. No  cranial nerve deficit. She exhibits normal muscle tone. Coordination normal.  There is a right-sided facial droop noted with temporal sparing. Her speech is somewhat slurred initially.  Skin: Skin is warm and dry. She is not diaphoretic.  Nursing note and vitals reviewed.   ED Course  Procedures (including critical care time) Labs Review Labs Reviewed  CBC - Abnormal; Notable for the following:    Hemoglobin 15.5 (*)    All other components within normal limits  I-STAT CHEM 8, ED - Abnormal; Notable for the following:    Chloride 99 (*)    Glucose, Bld 125 (*)    Hemoglobin 17.0 (*)    HCT 50.0 (*)    All other components within normal limits  CBG  MONITORING, ED - Abnormal; Notable for the following:    Glucose-Capillary 126 (*)    All other components within normal limits  PROTIME-INR  APTT  DIFFERENTIAL  ETHANOL  COMPREHENSIVE METABOLIC PANEL  URINE RAPID DRUG SCREEN (HOSP PERFORMED)  URINALYSIS, ROUTINE W REFLEX MICROSCOPIC  I-STAT TROPOININ, ED    Imaging Review Ct Head Wo Contrast  05/16/2015   CLINICAL DATA:  Code stroke. Right facial droop, right arm drift and slurred speech. Patient last seen in arm usual normal state approximately 1 hour 20 min ago. Prior history of stroke involving the junction of the right frontal and parietal lobes.  EXAM: CT HEAD WITHOUT CONTRAST  TECHNIQUE: Contiguous axial images were obtained from the base of the skull through the vertex without intravenous contrast.  COMPARISON:  MRI brain and intracranial MRA 11/25/2013. CT head 11/25/2013, 08/30/2007.  FINDINGS: Moderate cortical atrophy and mild deep atrophy, unchanged. No mass lesion. No midline shift. No acute hemorrhage or hematoma. No extra-axial fluid collections. No evidence of acute infarction. No significant interval change.  No skull fracture or other focal osseous abnormality involving the skull. Visualized paranasal sinuses, bilateral mastoid air cells and bilateral middle ear cavities  well-aerated. Bilateral carotid siphon atherosclerosis.  IMPRESSION: 1. No acute intracranial abnormality. 2. Stable moderate cortical atrophy and mild deep atrophy. I telephoned these results at the time of interpretation on 05/16/2015 at 12:50 pm to Dr. Nicholes Calamity of the Neurology Service, who verbally acknowledged these results.   Electronically Signed   By: Hulan Saas M.D.   On: 05/16/2015 12:51     EKG Interpretation   Date/Time:  Wednesday May 16 2015 12:42:47 EDT Ventricular Rate:  60 PR Interval:  189 QRS Duration: 104 QT Interval:  444 QTC Calculation: 444 R Axis:   -86 Text Interpretation:  Sinus rhythm Incomplete RBBB and LAFB Anteroseptal  infarct, old Nonspecific T abnormalities, lateral leads Confirmed by Taris Galindo   MD, Nikol Lemar ([id number]) on 05/16/2015 12:53:00 PM      MDM   Final diagnoses:  None    She arrived to the emergency department as a code stroke and was evaluated immediately by myself. Patient was also evaluated by Dr. Amada Jupiter from neurology as a possible candidate for TPA. Her symptoms began to improve shortly after her negative CT scan and the decision was made for her to be observed until the three-hour mark. If her symptoms are not worsening, neurology does not feel as though she is a candidate for TPA.   She has been observed and is now at baseline 3 hours after the onset of symptoms. She will be admitted to the hospitalist service for further workup into what appears to be a TIA.  CRITICAL CARE Performed by: Geoffery Lyons Total critical care time:  45 minutes Critical care time was exclusive of separately billable procedures and treating other patients. Critical care was necessary to treat or prevent imminent or life-threatening deterioration. Critical care was time spent personally by me on the following activities: development of treatment plan with patient and/or surrogate as well as nursing, discussions with consultants, evaluation of patient's  response to treatment, examination of patient, obtaining history from patient or surrogate, ordering and performing treatments and interventions, ordering and review of laboratory studies, ordering and review of radiographic studies, pulse oximetry and re-evaluation of patient's condition.     Geoffery Lyons, MD 05/16/15 [phone]

## 2015-05-16 NOTE — Code Documentation (Signed)
81[facility]  Patient was LKW at 1125 when she was talking on the phone to her daughter.  She reports having trouble getting her words out.  EMS reports dysarthria and right facial droop.  Stroke team to the bedside.  Labs drawn and patient to CT.  NIHSS 5, see documentation for details and code stroke times.  Patient with waxing and waning dysarthria.  Patient is currently enrolled in the RESPECT ESUS trial where she takes Pradaxa versus ASA. Dr. Amada JupiterKirkpatrick at the bedside.  Patient with symptoms improving, therefore, no tPA given at that time.  Patient remains in the window until 1425 should symptoms worsen.  Bedside handoff with ED RN Lillia AbedLindsay.

## 2015-05-16 NOTE — ED Notes (Signed)
Family now at the bedside.

## 2015-05-16 NOTE — H&P (Signed)
[facility] History and Physical                                                                                    Joyce [name], is a 81 y.o. female  MRN: [id number]   DOB - 1931/01/06  Admit Date - 05/16/2015  Outpatient Primary MD for the patient is Tana Conch, MD  Referring MD: Judd Lien / ER  Consulting MD: Amada Jupiter / Neurology  With History of -  Past Medical History  Diagnosis Date  . Unspecified essential hypertension   . Hyperlipidemia   . GERD (gastroesophageal reflux disease)   . Candidiasis of skin and nails   . Internal hemorrhoids without mention of complication   . Hypothyroidism   . IBS (irritable bowel syndrome)   . Coronary atherosclerosis of unspecified type of vessel, native or graft CARDIOLOGIST- [facility]    DENIES S & S  . Borderline glaucoma   . Adjustment disorder with anxiety STRESS CHEST DISCOMFORT    PT RECENTLY PUT HUSBAND IN NURSING HOME  . History of sleep apnea YRS AGO ON CPAP UNTIL LOST WT  . Pinched nerve LUMBAR    RESIDUAL LEFT FOOT NUMBNESS/ TINGLING  . Scoliosis   . Numbness and tingling of foot LEFT -- SECONDARY TO PINCHED LUMBAR NERVE  . Nocturia   . Stress incontinence   . Lesion of bladder   . Sinusitis 09/29/2013      Past Surgical History  Procedure Laterality Date  . Vaginal hysterectomy  1968  . Cholecystectomy  1976  . Cataract extraction w/ intraocular lens  implant, bilateral    . Cardiac catheterization  01-10-2003   DR Chrissie Noa DOWNEY    PATENT CIRCUMFLEX STENT/ MODERATE CAD ELSEWHERE  . Coronary angioplasty with stent placement  08-31-2001   DR BRUCE BRODIE    STENTING OF PROXIMAL CIRCUMFLEX (75% TO 0%)   . Cardiovascular stress test  07-27-2012    NORMAL NUCLEAR STUDY/ LVEF 81%  . Cystoscopy with biopsy  08/02/2012    Procedure: CYSTOSCOPY WITH BIOPSY;  Surgeon: Milford Cage, MD;  Location: [facility];  Service: Urology;  Laterality: N/A;    in for   Chief Complaint    Patient presents with  . Code Stroke     HPI This is a very pleasant 81 year old female patient with a prior history of TIA symptoms in 2014; she also has a history of hypertension, dyslipidemia, GERD, hypothyroidism, CAD, chronic left lower extremity neuropathy secondary to lumbar disc disease. She presented to the ER today after she began having sudden onset of slurred speech with right-sided numbness at home. Symptoms began at 11:25 AM. Patient was brought to the ER by EMS where she still was experiencing prominent right facial droop and severe dysarthria. A code stroke was initiated. Patient began to rapidly improve her symptoms. Neurology was consulted. Because her symptoms had rapidly resolved TPA was not given. Her initial CT the head was negative. She was moderately hypertensive upon arrival with a BP of 194/57 with repeat blood pressure 176/48. Otherwise vital signs were within normal limits. Laboratory data was unremarkable except for mild hyperglycemia with glucose 121. Total bilirubin  slightly elevated at 1.7. Troponin normal 0.01. She appeared to be hemoconcentrated with a hemoglobin of 15.5 noting baseline 13.7. Urinalysis as well as urine drug screen was negative. KG was unremarkable. MRI/MRA brain has been ordered by neurology and is pending completion.  Upon my examination of the patient she remains symptom-free from a neurological standpoint. She reports over the past year she has had increased emotional stress due to the fact she lost her husband of 63 years during the previous year. When she is very anxious and upset regarding her husband's death she does have chest pressure which is not similar to her prior cardiac chest pain. She's not had any awareness of tachypalpitations or exertional chest pain, she does have chronic dyspnea on exertion, she has chronic discomfort and neuropathy symptoms of the left lower extremity, no dark or bloody stools, no blood in urine. She's not had any  neurological symptoms similar to today's other than when she had the TIA type symptoms in 2014.   Review of Systems   In addition to the HPI above,  No Fever-chills, myalgias or other constitutional symptoms No Headache, changes with Vision or hearing, new weakness, tingling, numbness in any extremity, No problems swallowing food or Liquids, indigestion/reflux No Chest pain, Cough, palpitations, orthopnea  No Abdominal pain, N/V; no melena or hematochezia, no dark tarry stools, Bowel movements are regular, No dysuria, hematuria or flank pain No new skin rashes, lesions, masses or bruises, No new joints pains-aches No recent weight gain or loss No polyuria, polydypsia or polyphagia,  *A full 10 point Review of Systems was done, except as stated above, all other Review of Systems were negative.  Social History History  Substance Use Topics  . Smoking status: Former Smoker -- 1.50 packs/day for 20 years    Types: Cigarettes    Quit date: 07/31/1983  . Smokeless tobacco: Never Used  . Alcohol Use: No    Resides at: Private residence  Lives with: Alone  Ambulatory status: Without assistive devices prior to admission   Family History Family History  Problem Relation Age of Onset  . Heart disease Mother     per patient died of old age at 34  . CAD Father     massive MI age 41     Prior to Admission medications   Medication Sig Start Date End Date Taking? Authorizing Provider  amLODipine (NORVASC) 2.5 MG tablet TAKE ONE TABLET BY MOUTH ONCE DAILY 01/16/15  Yes Shelva Majestic, MD  atenolol (TENORMIN) 25 MG tablet TAKE ONE TABLET BY MOUTH TWICE DAILY 01/16/15  Yes Shelva Majestic, MD  Biotin 1000 MCG tablet Take 1,000 mcg by mouth daily.   Yes Historical Provider, MD  CRANBERRY CONCENTRATE PO Take 1 tablet by mouth daily.   Yes Historical Provider, MD  INVESTIGATIONAL DRUG SIMPLE RECORD Take 75 mg by mouth daily with breakfast. 11/27/13  Yes Meredeth Ide, MD  Lactobacillus  (ACIDOPHILUS) 10 MG CAPS Take 1 capsule by mouth daily.    Yes Historical Provider, MD  levothyroxine (SYNTHROID, LEVOTHROID) 100 MCG tablet Take 1 tablet (100 mcg total) by mouth daily. 01/16/15  Yes Shelva Majestic, MD  LORazepam (ATIVAN) 0.5 MG tablet Take 0.5 tablets (0.25 mg total) by mouth at bedtime as needed for anxiety. 01/16/15  Yes Shelva Majestic, MD  niacinamide 500 MG tablet Take 500 mg by mouth daily.   Yes Historical Provider, MD  nitroGLYCERIN (NITROSTAT) 0.4 MG SL tablet Place 1 tablet (0.4 mg total)  under the tongue every 5 (five) minutes as needed for chest pain. 01/16/15  Yes Shelva MajesticStephen O Hunter, MD  Omega-3 Fatty Acids (FISH OIL) 1000 MG CAPS Take 1 capsule by mouth daily.   Yes Historical Provider, MD  oxybutynin (DITROPAN) 5 MG tablet Take 5 mg by mouth 2 (two) times daily.    Yes Historical Provider, MD  Probiotic Product (ALIGN PO) Take 1 capsule by mouth daily.   Yes Historical Provider, MD  pyridOXINE (B-6) 50 MG tablet Take 50 mg by mouth daily.   Yes Historical Provider, MD  ranitidine (ZANTAC) 150 MG tablet TAKE ONE TABLET BY MOUTH TWICE DAILY AS NEEDED FOR HEARTBURN Patient taking differently: Take 150 mg by mouth 2 (two) times daily. TAKE ONE TABLET BY MOUTH TWICE DAILY AS NEEDED FOR HEARTBURN 01/16/15  Yes Shelva MajesticStephen O Hunter, MD  vitamin B-12 (CYANOCOBALAMIN) 500 MCG tablet Take 500 mcg by mouth daily.    Yes Historical Provider, MD  vitamin E 400 UNIT capsule Take 400 Units by mouth daily.   Yes Historical Provider, MD    Allergies  Allergen Reactions  . Loratadine Other (See Comments)    Gi upset  . Adhesive [Tape] Other (See Comments)    Bruising and severe irritation  . Aspirin Other (See Comments)    Gi upset  . Levaquin [Levofloxacin In D5w] Itching  . Levofloxacin Nausea And Vomiting  . Lumigan [Bimatoprost] Other (See Comments)    Severe burning of eyes  . Mobic [Meloxicam] Nausea And Vomiting and Other (See Comments)    hallucinations  . Penicillins  Diarrhea and Nausea And Vomiting  . Statins Other (See Comments)    Severe muscle pain    Physical Exam  Vitals  Blood pressure 176/48, pulse 67, resp. rate 16, weight 148 lb 2.4 oz (67.2 kg), SpO2 95 %.   General:  In no acute distress, appears healthy and well nourished and younger than stated age  Psych:  Normal affect, Denies Suicidal or Homicidal ideations, Awake Alert, Oriented X 3. Speech and thought patterns are clear and appropriate, no apparent short term memory deficits  Neuro:   No focal neurological deficits, CN II through XII intact, Strength 5/5 all 4 extremities, Sensation intact all 4 extremities.  ENT:  Ears and Eyes appear Normal, Conjunctivae clear, PER. Moist oral mucosa without erythema or exudates.  Neck:  Supple, No lymphadenopathy appreciated  Respiratory:  Symmetrical chest wall movement, Good air movement bilaterally, CTAB. Room Air  Cardiac:  RRR, No Murmurs, no LE edema noted, no JVD, No carotid bruits, peripheral pulses palpable at 2+  Abdomen:  Positive bowel sounds, Soft, Non tender, Non distended,  No masses appreciated, no obvious hepatosplenomegaly  Skin:  No Cyanosis, Normal Skin Turgor, No Skin Rash or Bruise.  Extremities: Symmetrical without obvious trauma or injury,  no effusions.  Data Review  CBC  Recent Labs Lab 05/16/15 1230 05/16/15 1235  WBC 5.4  --   HGB 15.5* 17.0*  HCT 44.4 50.0*  PLT 176  --   MCV 87.1  --   MCH 30.4  --   MCHC 34.9  --   RDW 13.2  --   LYMPHSABS 2.3  --   MONOABS 0.5  --   EOSABS 0.1  --   BASOSABS 0.0  --     Chemistries   Recent Labs Lab 05/16/15 1230 05/16/15 1235  NA 137 138  K 4.1 4.0  CL 101 99*  CO2 26  --   GLUCOSE 121*  125*  BUN 9 10  CREATININE 0.62 0.60  CALCIUM 9.3  --   AST 28  --   ALT 23  --   ALKPHOS 57  --   BILITOT 1.7*  --     CrCl cannot be calculated (Unknown ideal weight.).  No results for input(s): TSH, T4TOTAL, T3FREE, THYROIDAB in the last 72  hours.  Invalid input(s): FREET3  Coagulation profile  Recent Labs Lab 05/16/15 1230  INR 1.03    No results for input(s): DDIMER in the last 72 hours.  Cardiac Enzymes No results for input(s): CKMB, TROPONINI, MYOGLOBIN in the last 168 hours.  Invalid input(s): CK  Invalid input(s): POCBNP  Urinalysis    Component Value Date/Time   COLORURINE YELLOW 05/16/2015 1316   APPEARANCEUR CLEAR 05/16/2015 1316   LABSPEC 1.008 05/16/2015 1316   PHURINE 8.0 05/16/2015 1316   GLUCOSEU NEGATIVE 05/16/2015 1316   HGBUR NEGATIVE 05/16/2015 1316   HGBUR negative 05/07/2010 1330   BILIRUBINUR NEGATIVE 05/16/2015 1316   BILIRUBINUR n 08/09/2012 1407   KETONESUR NEGATIVE 05/16/2015 1316   PROTEINUR NEGATIVE 05/16/2015 1316   PROTEINUR n 08/09/2012 1407   UROBILINOGEN 0.2 05/16/2015 1316   UROBILINOGEN 0.2 08/09/2012 1407   NITRITE NEGATIVE 05/16/2015 1316   NITRITE pos 08/09/2012 1407   LEUKOCYTESUR NEGATIVE 05/16/2015 1316    Imaging results:   Ct Head Wo Contrast  05/16/2015   CLINICAL DATA:  Code stroke. Right facial droop, right arm drift and slurred speech. Patient last seen in arm usual normal state approximately 1 hour 20 min ago. Prior history of stroke involving the junction of the right frontal and parietal lobes.  EXAM: CT HEAD WITHOUT CONTRAST  TECHNIQUE: Contiguous axial images were obtained from the base of the skull through the vertex without intravenous contrast.  COMPARISON:  MRI brain and intracranial MRA 11/25/2013. CT head 11/25/2013, 08/30/2007.  FINDINGS: Moderate cortical atrophy and mild deep atrophy, unchanged. No mass lesion. No midline shift. No acute hemorrhage or hematoma. No extra-axial fluid collections. No evidence of acute infarction. No significant interval change.  No skull fracture or other focal osseous abnormality involving the skull. Visualized paranasal sinuses, bilateral mastoid air cells and bilateral middle ear cavities well-aerated. Bilateral  carotid siphon atherosclerosis.  IMPRESSION: 1. No acute intracranial abnormality. 2. Stable moderate cortical atrophy and mild deep atrophy. I telephoned these results at the time of interpretation on 05/16/2015 at 12:50 pm to Dr. Nicholes Calamity of the Neurology Service, who verbally acknowledged these results.   Electronically Signed   By: Hulan Saas M.D.   On: 05/16/2015 12:51     EKG: (Independently reviewed) unless rhythm, incomplete right bundle branch block, inverted T waves in leads V6, no apparent acute ischemic changes.   Assessment & Plan  Principal Problem:   TIA -Admit to neuro telemetry floor -Appreciate neurology assistance -Follow-up on MRI/MRA brain -Check 2-D echocardiogram and carotid duplex -Check hemoglobin A1c and lipid panel -PT/OT/SLP evaluations -Okay to advance diet if passes bedside swallowing exam -Was on a Protonix a versus study drug line blinded trial-neurology aware since patient did not receive TPA okay to remain on trial and continue current preadmission medications unless MRI shows ischemia then neurology recommends aspirin 325 mg daily   Active Problems:   Hypothyroidism -Continue Synthroid    Dyslipidemia -Continue omega-3 fatty acids and niacin    Essential hypertension -Continue Tenormin and Norvasc    CAD (coronary artery disease) -Currently asymptomatic -Continue beta blocker    GERD -On Zantac prior  to admission-Pepcid substituted therapeutically    Lumbar pain with radiation down left leg -Continue vitamin B6 and other supportive care    Overactive bladder -Continue oxybutynin    DVT Prophylaxis: Subcutaneous heparin  Family Communication:   Daughter at bedside  Code Status:  DO NOT RESUSCITATE  Condition:  Stable  Discharge disposition: Anticipate will return to home pending TIA workup  Time spent in minutes : 60      ELLIS,ALLISON L. ANP on 05/16/2015 at 3:31 PM  Between 7am to 7pm - Pager -  [phone]  After 7pm go to [email or web address] - password [email or web address]  And look for the night coverage person covering me after hours  [facility]

## 2015-05-16 NOTE — ED Notes (Signed)
Pt to department via EMS- pt was talking on the phone with her daughter and started having slurred speech and facial droop at 1125. Pt arrived A&Ox4 with slurred speech. CBG-111 Hr-64 Bp-220 palp. 18g right AC 20g left hand.

## 2015-05-16 NOTE — ED Notes (Signed)
Stroke team remains at the bedside. 

## 2015-05-17 ENCOUNTER — Encounter (HOSPITAL_COMMUNITY): Payer: Self-pay | Admitting: Radiology

## 2015-05-17 ENCOUNTER — Ambulatory Visit (HOSPITAL_BASED_OUTPATIENT_CLINIC_OR_DEPARTMENT_OTHER): Payer: Medicare Other

## 2015-05-17 ENCOUNTER — Inpatient Hospital Stay (HOSPITAL_COMMUNITY): Payer: Medicare Other

## 2015-05-17 ENCOUNTER — Telehealth: Payer: Self-pay | Admitting: Neurology

## 2015-05-17 ENCOUNTER — Ambulatory Visit (HOSPITAL_COMMUNITY): Payer: Medicare Other

## 2015-05-17 DIAGNOSIS — G451 Carotid artery syndrome (hemispheric): Secondary | ICD-10-CM | POA: Diagnosis not present

## 2015-05-17 DIAGNOSIS — E785 Hyperlipidemia, unspecified: Secondary | ICD-10-CM

## 2015-05-17 DIAGNOSIS — Z8673 Personal history of transient ischemic attack (TIA), and cerebral infarction without residual deficits: Secondary | ICD-10-CM | POA: Diagnosis not present

## 2015-05-17 DIAGNOSIS — I10 Essential (primary) hypertension: Secondary | ICD-10-CM | POA: Diagnosis not present

## 2015-05-17 DIAGNOSIS — G459 Transient cerebral ischemic attack, unspecified: Secondary | ICD-10-CM | POA: Diagnosis not present

## 2015-05-17 DIAGNOSIS — I6602 Occlusion and stenosis of left middle cerebral artery: Secondary | ICD-10-CM | POA: Diagnosis not present

## 2015-05-17 DIAGNOSIS — I2510 Atherosclerotic heart disease of native coronary artery without angina pectoris: Secondary | ICD-10-CM | POA: Diagnosis not present

## 2015-05-17 LAB — LIPID PANEL
Cholesterol: 231 mg/dL — ABNORMAL HIGH (ref 0–200)
HDL: 50 mg/dL (ref >40)
LDL CALC: 164 mg/dL — AB (ref 0–99)
Total CHOL/HDL Ratio: 4.6 RATIO
Triglycerides: 84 mg/dL (ref <150)
VLDL: 17 mg/dL (ref 0–40)

## 2015-05-17 MED ORDER — CLOPIDOGREL BISULFATE 75 MG PO TABS: 300.0000 mg | ORAL_TABLET | Freq: Once | ORAL | Status: DC

## 2015-05-17 MED ORDER — STUDY - INVESTIGATIONAL MEDICATION
100.0000 mg | Freq: Every day | Status: DC
  Administered 2015-05-17 – 2015-05-18 (×2): 100 mg via ORAL
  Filled 2015-05-17 (×2): qty 1

## 2015-05-17 MED ORDER — IOHEXOL 350 MG/ML SOLN
50.0000 mL | Freq: Once | INTRAVENOUS | Status: AC | PRN
  Administered 2015-05-17: 50 mL via INTRAVENOUS

## 2015-05-17 MED ORDER — CLOPIDOGREL BISULFATE 75 MG PO TABS: 75.0000 mg | ORAL_TABLET | Freq: Every day | ORAL | Status: DC

## 2015-05-17 MED ORDER — STUDY - INVESTIGATIONAL MEDICATION
110.0000 mg | Freq: Two times a day (BID) | Status: DC
  Administered 2015-05-17 – 2015-05-18 (×3): 110 mg via ORAL
  Filled 2015-05-17 (×5): qty 1

## 2015-05-17 NOTE — Progress Notes (Signed)
STROKE TEAM PROGRESS NOTE   HISTORY Joyce [name] is a 81 y.o. female with a history of TIA two hours ago who was talking on the phone at approximately 11:25am 05/16/2015 when she had sudden onset of slurred speech and right sided numbness. She was brought to the ER via EMS where a prominent right facial droop and severe dysarthria were observed. tPA was initially discussed, however the patient had improvement in her symptoms to the point that her symptoms were non-disabling and therefore tPA was not administered d/t rapidly improving symptoms.  She was admitted for further evaluation and treatment.   SUBJECTIVE (INTERVAL HISTORY) Her daughter and family are at the bedside.  Overall she feels her condition is resolved. Pt concerned about stroke prevention medications. Dr. Roda Shutters discussed in detail with pt and daughter about investigational meds. She is agreeable to continue study at thie time.   OBJECTIVE Temp:  [97.7 F (36.5 C)-98.8 F (37.1 C)] 98 F (36.7 C) (05/26 0946) Pulse Rate:  [50-75] 60 (05/26 0946) Cardiac Rhythm:  [-] Normal sinus rhythm (05/26 0800) Resp:  [12-21] 16 (05/26 0946) BP: (115-209)/(41-94) 120/62 mmHg (05/26 0946) SpO2:  [95 %-99 %] 99 % (05/26 0946) Weight:  [67.2 kg (148 lb 2.4 oz)] 67.2 kg (148 lb 2.4 oz) (05/25 1246)   Recent Labs Lab 05/16/15 1244  GLUCAP 126*    Recent Labs Lab 05/16/15 1230 05/16/15 1235  NA 137 138  K 4.1 4.0  CL 101 99*  CO2 26  --   GLUCOSE 121* 125*  BUN 9 10  CREATININE 0.62 0.60  CALCIUM 9.3  --     Recent Labs Lab 05/16/15 1230  AST 28  ALT 23  ALKPHOS 57  BILITOT 1.7*  PROT 7.8  ALBUMIN 3.8    Recent Labs Lab 05/16/15 1230 05/16/15 1235  WBC 5.4  --   NEUTROABS 2.5  --   HGB 15.5* 17.0*  HCT 44.4 50.0*  MCV 87.1  --   PLT 176  --    No results for input(s): CKTOTAL, CKMB, CKMBINDEX, TROPONINI in the last 168 hours.  Recent Labs  05/16/15 1230  LABPROT 13.7  INR 1.03    Recent Labs   05/16/15 1316  COLORURINE YELLOW  LABSPEC 1.008  PHURINE 8.0  GLUCOSEU NEGATIVE  HGBUR NEGATIVE  BILIRUBINUR NEGATIVE  KETONESUR NEGATIVE  PROTEINUR NEGATIVE  UROBILINOGEN 0.2  NITRITE NEGATIVE  LEUKOCYTESUR NEGATIVE       Component Value Date/Time   CHOL 231* 05/17/2015 0543   TRIG 84 05/17/2015 0543   HDL 50 05/17/2015 0543   CHOLHDL 4.6 05/17/2015 0543   VLDL 17 05/17/2015 0543   LDLCALC 164* 05/17/2015 0543   Lab Results  Component Value Date   HGBA1C 5.6 11/25/2013      Component Value Date/Time   LABOPIA NONE DETECTED 05/16/2015 1316   COCAINSCRNUR NONE DETECTED 05/16/2015 1316   LABBENZ NONE DETECTED 05/16/2015 1316   AMPHETMU NONE DETECTED 05/16/2015 1316   THCU NONE DETECTED 05/16/2015 1316   LABBARB NONE DETECTED 05/16/2015 1316     Recent Labs Lab 05/16/15 1230  ETH <5   I have personally reviewed the radiological images below and agree with the radiology interpretations. Blue text is Dr. Warren Danes interpretation.  Dg Chest 2 View 05/17/2015   1. No active cardiopulmonary disease. 2. Chronic cardiomegaly.     Ct Head Wo Contrast 05/16/2015   1. No acute intracranial abnormality. 2. Stable moderate cortical atrophy and mild deep atrophy.  Mr Brain Wo Contrast 05/16/2015   Chronic changes as described.  No acute intracranial findings.    Mr Shirlee LatchMra Head Wo Contrast 05/16/2015   Left MCA M1 stenosis, progressed from MRA done 11/2013.      2D echo - pending  CTA head and neck - No significant carotid or vertebral artery stenosis in the neck. Moderate stenosis left M1 segment. Otherwise no significant intracranial stenosis or vascular malformation.  PHYSICAL EXAM Physical exam  Temp:  [97.7 F (36.5 C)-99.3 F (37.4 C)] 97.8 F (36.6 C) (05/26 2105) Pulse Rate:  [50-62] 55 (05/26 2105) Resp:  [12-18] 18 (05/26 2105) BP: (115-176)/(38-72) 141/38 mmHg (05/26 2105) SpO2:  [94 %-99 %] 98 % (05/26 2105)  General - Well nourished, well developed, in no  apparent distress.  Ophthalmologic - Sharp disc margins OU.   Cardiovascular - Regular rate and rhythm with no murmur.  Mental Status -  Level of arousal and orientation to time, place, and person were intact. Language including expression, naming, repetition, comprehension was assessed and found intact. Fund of Knowledge was assessed and was intact.  Cranial Nerves II - XII - II - Visual field intact OU. III, IV, VI - Extraocular movements intact. V - Facial sensation intact bilaterally. VII - Facial movement intact bilaterally. VIII - Hearing & vestibular intact bilaterally. X - Palate elevates symmetrically. XI - Chin turning & shoulder shrug intact bilaterally. XII - Tongue protrusion intact.  Motor Strength - The patient's strength was normal in all extremities and pronator drift was absent.  Bulk was normal and fasciculations were absent.   Motor Tone - Muscle tone was assessed at the neck and appendages and was normal.  Reflexes - The patient's reflexes were 1+ in all extremities and she had no pathological reflexes.  Sensory - Light touch, temperature/pinprick, vibration and proprioception, and Romberg testing were assessed and were symmetrical.    Coordination - The patient had normal movements in the hands and feet with no ataxia or dysmetria.  Tremor was absent.  Gait and Station - The patient's transfers, posture, gait, station, and turns were observed as normal.   ASSESSMENT/PLAN [name] is a 81 y.o. female with history of TIA presenting with slurred speech and right sided numbness. She did not receive IV t-PA due to rapidly improving symptoms.   RE-SPECT ESUS research study pt, on Pradaxa or ASA, NO PLAVIX or heparins allowed, ASA optional as co-medication for CAD  TIA - likely due to left M1 stenosis which is progressed from 11/2013  Resultant  Neuro deficits resolved  MRI  No acute stroke  MRA  Left M1 stenosis, progressed from 11/2013  CTA  head and neck showed left M1 moderate stenosis.  2D Echo  pending   LDL 164, not at goal  HgbA1c pending  SCDs ordered for VTE prophylaxis. Cannot take any heparinoids d/t research study. Diet Heart Room service appropriate?: Yes; Fluid consistency:: Thin  RE-SPECT ESUS (Pradaxa vs ASA) prior to admission, continued in hospital. As no stroke this time, pt can remain in the study.   Patient counseled to be compliant with her study antithrombotic medications  Ongoing aggressive stroke risk factor management  Therapy recommendations:  No PT  Disposition:  Return home  Hypertension  Home meds:   Norvasc, tenormin,   Currently on atenolol  Stable  Hyperlipidemia  Home meds:  Lovaza, niacin, resumed in hospital  LDL 164, goal < 70  No statin due to known intolerance  Pt needs  to discuss with PCP regarding PCSK9 inhibitors for risk factor control.  Other Stroke Risk Factors  Advanced age  Hx TIA/stroke  Coronary artery disease  Hx Obstructive sleep apnea, on CPAP until lost weight  Hospital day # 1  Marvel Plan, MD PhD Stroke Neurology 05/17/2015 10:00 PM      To contact Stroke Continuity provider, please refer to [email or web address]. After hours, contact General Neurology

## 2015-05-17 NOTE — Progress Notes (Signed)
[facility] PROGRESS NOTE  Joyce [name] ZOX:[id number] DOB: [birth year] DOA: 05/16/2015 PCP: Tana Conch, MD  Assessment/Plan:  Principal Problem:   TIA (transient ischemic attack): Symptoms resolved. MRI negative for infarct.  Discussed with Dr. Roda Shutters. Apparently on MRA, there is an area of possible stenosis intracranially. He spoke with Dr. Pearlean Brownie and they both agree that CT angiogram will be needed to evaluate further. Results pending. Other workup underway Active Problems:   Hypothyroidism   Dyslipidemia:  Statin intolerant   Essential hypertension   CAD (coronary artery disease)   GERD   Lumbar pain with radiation down left leg   Overactive bladder   Code Status:  full Family Communication:  Daughter at bedside Disposition Plan:  Home once testing complete and plans clarified by neurology  HPI/Subjective: No recurrence of TIA symptoms. No complaints.  Objective: Filed Vitals:   05/17/15 0946  BP: 120/62  Pulse: 60  Temp: 98 F (36.7 C)  Resp: 16   No intake or output data in the 24 hours ending 05/17/15 1314 Filed Weights   05/16/15 1246  Weight: 67.2 kg (148 lb 2.4 oz)    Exam:   General:  Alert, oriented, appropriate  Cardiovascular: Regular rate rhythm without murmurs gallops rubs  Respiratory: Clear to auscultation bilaterally without wheezes rhonchi or rales  Abdomen: Soft nontender nondistended  Ext: No clubbing cyanosis or edema  Neurologic: Cranial nerves intact. Motor strength 5 out of 5 throughout. Speech clear and fluent.  Basic Metabolic Panel:  Recent Labs Lab 05/16/15 1230 05/16/15 1235  NA 137 138  K 4.1 4.0  CL 101 99*  CO2 26  --   GLUCOSE 121* 125*  BUN 9 10  CREATININE 0.62 0.60  CALCIUM 9.3  --    Liver Function Tests:  Recent Labs Lab 05/16/15 1230  AST 28  ALT 23  ALKPHOS 57  BILITOT 1.7*  PROT 7.8  ALBUMIN 3.8   No results for input(s): LIPASE, AMYLASE in the last 168 hours. No results for  input(s): AMMONIA in the last 168 hours. CBC:  Recent Labs Lab 05/16/15 1230 05/16/15 1235  WBC 5.4  --   NEUTROABS 2.5  --   HGB 15.5* 17.0*  HCT 44.4 50.0*  MCV 87.1  --   PLT 176  --    Cardiac Enzymes: No results for input(s): CKTOTAL, CKMB, CKMBINDEX, TROPONINI in the last 168 hours. BNP (last 3 results) No results for input(s): BNP in the last 8760 hours.  ProBNP (last 3 results) No results for input(s): PROBNP in the last 8760 hours.  CBG:  Recent Labs Lab 05/16/15 1244  GLUCAP 126*    No results found for this or any previous visit (from the past 240 hour(s)).   Studies: Dg Chest 2 View  05/17/2015   CLINICAL DATA:  Stroke  EXAM: CHEST  2 VIEW  COMPARISON:  08/05/2012  FINDINGS: Chronic cardiomegaly. Negative aortic and hilar contours. Coronary stent noted.  There is no edema, consolidation, effusion, or pneumothorax.  No acute osseous findings.  IMPRESSION: 1. No active cardiopulmonary disease. 2. Chronic cardiomegaly.   Electronically Signed   By: Marnee Spring M.D.   On: 05/17/2015 01:18   Ct Head Wo Contrast  05/16/2015   CLINICAL DATA:  Code stroke. Right facial droop, right arm drift and slurred speech. Patient last seen in arm usual normal state approximately 1 hour 20 min ago. Prior history of stroke involving the junction of the right frontal and parietal lobes.  EXAM:  CT HEAD WITHOUT CONTRAST  TECHNIQUE: Contiguous axial images were obtained from the base of the skull through the vertex without intravenous contrast.  COMPARISON:  MRI brain and intracranial MRA 11/25/2013. CT head 11/25/2013, 08/30/2007.  FINDINGS: Moderate cortical atrophy and mild deep atrophy, unchanged. No mass lesion. No midline shift. No acute hemorrhage or hematoma. No extra-axial fluid collections. No evidence of acute infarction. No significant interval change.  No skull fracture or other focal osseous abnormality involving the skull. Visualized paranasal sinuses, bilateral mastoid air  cells and bilateral middle ear cavities well-aerated. Bilateral carotid siphon atherosclerosis.  IMPRESSION: 1. No acute intracranial abnormality. 2. Stable moderate cortical atrophy and mild deep atrophy. I telephoned these results at the time of interpretation on 05/16/2015 at 12:50 pm to Dr. Nicholes Calamity of the Neurology Service, who verbally acknowledged these results.   Electronically Signed   By: Hulan Saas M.D.   On: 05/16/2015 12:51   Mr Maxine Glenn Head Wo Contrast  05/16/2015   CLINICAL DATA:  Sudden onset of slurred speech in RIGHT-sided numbness earlier today. History of cerebral ischemia 2 years ago.  EXAM: MRI HEAD WITHOUT CONTRAST  MRA HEAD WITHOUT CONTRAST  TECHNIQUE: Multiplanar, multiecho pulse sequences of the brain and surrounding structures were obtained without intravenous contrast. Angiographic images of the head were obtained using MRA technique without contrast.  COMPARISON:  CT head earlier today.  FINDINGS: MRI HEAD FINDINGS  No evidence for acute infarction, hemorrhage, mass lesion, hydrocephalus, or extra-axial fluid. Advanced cerebral and cerebellar atrophy. Mild subcortical and periventricular T2 and FLAIR hyperintensities, likely chronic microvascular ischemic change. Pituitary, pineal, and cerebellar tonsils unremarkable. No upper cervical lesions.  Flow voids are maintained throughout the carotid, basilar, and vertebral arteries. There are no areas of chronic hemorrhage. Visualized calvarium, skull base, and upper cervical osseous structures unremarkable. Scalp and extracranial soft tissues, orbits, sinuses, and mastoids show no acute process.  MRA HEAD FINDINGS  Internal carotid arteries are widely patent. Basilar artery widely patent with LEFT vertebral dominant of RIGHT vertebral contributory. No intracranial stenosis or aneurysm.  IMPRESSION: Chronic changes as described.  No acute intracranial findings.  No proximal flow reducing lesion is evident.   Electronically Signed   By:  Davonna Belling M.D.   On: 05/16/2015 15:58   Mr Brain Wo Contrast  05/16/2015   CLINICAL DATA:  Sudden onset of slurred speech in RIGHT-sided numbness earlier today. History of cerebral ischemia 2 years ago.  EXAM: MRI HEAD WITHOUT CONTRAST  MRA HEAD WITHOUT CONTRAST  TECHNIQUE: Multiplanar, multiecho pulse sequences of the brain and surrounding structures were obtained without intravenous contrast. Angiographic images of the head were obtained using MRA technique without contrast.  COMPARISON:  CT head earlier today.  FINDINGS: MRI HEAD FINDINGS  No evidence for acute infarction, hemorrhage, mass lesion, hydrocephalus, or extra-axial fluid. Advanced cerebral and cerebellar atrophy. Mild subcortical and periventricular T2 and FLAIR hyperintensities, likely chronic microvascular ischemic change. Pituitary, pineal, and cerebellar tonsils unremarkable. No upper cervical lesions.  Flow voids are maintained throughout the carotid, basilar, and vertebral arteries. There are no areas of chronic hemorrhage. Visualized calvarium, skull base, and upper cervical osseous structures unremarkable. Scalp and extracranial soft tissues, orbits, sinuses, and mastoids show no acute process.  MRA HEAD FINDINGS  Internal carotid arteries are widely patent. Basilar artery widely patent with LEFT vertebral dominant of RIGHT vertebral contributory. No intracranial stenosis or aneurysm.  IMPRESSION: Chronic changes as described.  No acute intracranial findings.  No proximal flow reducing lesion is evident.  Electronically Signed   By: Davonna BellingJohn  Curnes M.D.   On: 05/16/2015 15:58    Scheduled Meds: .  stroke: mapping our early stages of recovery book   Does not apply Once  . atenolol  25 mg Oral Daily  . cyanocobalamin  500 mcg Oral Daily  . levothyroxine  100 mcg Oral QAC breakfast  . niacin  500 mg Oral QHS  . omega-3 acid ethyl esters  2 g Oral Daily  . oxybutynin  5 mg Oral BID  . pyridOXINE  50 mg Oral Daily  . ranitidine  150  mg Oral BID  . Investigational - Study Medication  100 mg Oral Daily   And  . Investigational - Study Medication  110 mg Oral BID   Continuous Infusions:   Time spent: 35 minutes  Kincade Granberg L  [facility] Pager [phone]. If 7PM-7AM, please contact night-coverage at [email or web address], password [email or web address] 05/17/2015, 1:14 PM  LOS: 1 day

## 2015-05-17 NOTE — Progress Notes (Signed)
MEDICATION RELATED CONSULT NOTE - FOLLOW UP   Pharmacy Re:  Research Study Medication  Dabigatran Etexilate for Secondary Stroke Prevention in Patients With Embolic Stroke of Undetermined Source (RE-SPECT ESUS)  Experimental: dabigatran etexilate 110 or 150 mg  Patients will be assigned Dabigatran 150 mg b.i.d. (unless they are 75 years or older, or have a Creatinine Clearance (CrCl) of greater than or equal to 30 to less than 6050ml/min (or experience GI bleed during trial), in which case they will receive Dabigatran 110 mg b.i.d.). All patients in this arm will also receive ASA placebo (q.d.) Drug: optional ASA as comedication  optional concomitant treatment which can be used for patients with coronary artery disease. It is not required for these pts. Drug: placebo to ASA  placebo to comparator drug Drug: dabigatran etexilate  active drug  Active Comparator: ASA 100 mg  All patients will receive blinded ASA 100 mg q.d. and dabigatran placebo 150 mg b.i.d. (unless they are 75 years or older, or have a CrCl of greater than or equal to 30 to less than 7750ml/min (or experience GI bleed during trial), in which case they will receive placebo Dabigatran 110 mg b.i.d Drug: placebo to optional ASA as comedication  optional concomitant treatment which can be used for patients with coronary artery disease. It is not required for these pts. Drug: placebo to dabigatran etexilate  placebo Drug: ASA 100 mg  active comparator drug    Assessment: [age] yo female presents with an episode of slurred speech which resolved in the ED.  She is currently enrolled in the RE-SPECT ESUS study which is evaluating Dabigatran vs. Aspirin therapy in a double blinded protocol.  I spoke with Dr. Pearlean BrownieSethi this morning regarding the continuation of this therapy vs. the initiation of Clopidogrel.     Plan:  - Continue study medication for now - further review by neuro team.  Nadara MustardNita Tilda Samudio, PharmD., MS Clinical Pharmacist Pager:   [phone] Thank you for allowing pharmacy to be part of this patients care team. 05/17/2015,9:37 AM

## 2015-05-17 NOTE — Progress Notes (Addendum)
Patient is enrolled in a study where she could possibly be getting Pradaxa (blinded).  Janett BillowNita, PharmD spoke to Dr. Pearlean BrownieSethi who would like to continue the study drug given the MRI is negative.  No ASA nor Plavix while on study drug.  Study drug locates on the 2nd floor satellite and RN to pick up med twice daily.   Tristan Proto D. Laney Potashang, PharmD, BCPS Pager:  [phone] 05/17/2015, 9:46 AM

## 2015-05-17 NOTE — Evaluation (Signed)
Physical Therapy Evaluation Patient Details Name: Joyce AlarJennie G Leitzel MRN: [id number] DOB: May 07, 1931 Today's Date: 05/17/2015   History of Present Illness  81 y.o. female with a history of TIA two years ago who was talking on the phone at approximately 11:25am when she had sudden onset of slurred speech and right sided numbness. She was brought to the ER via EMS where a prominent right facial droop and severe dysarthria were observed.  Work up underway.  Clinical Impression  Patient demonstrates modest deficits in functional mobility but mobilizes well and reports that symptoms have resolved. Daughter at bedside. Educated patient and daughter regarding BEFAST stroke symptoms. At this time, patient very near baseline, no further acute PT needs. Will sign off. Patient in agreement.    Follow Up Recommendations No PT follow up;Supervision - Intermittent    Equipment Recommendations  None recommended by PT    Recommendations for Other Services       Precautions / Restrictions Precautions Precautions: Fall      Mobility  Bed Mobility Overal bed mobility: Modified Independent             General bed mobility comments: increased time to perform, no physical assist required  Transfers Overall transfer level: Needs assistance Equipment used: None Transfers: Sit to/from Stand Sit to Stand: Modified independent (Device/Increase time)         General transfer comment: increased time to perform (patient with awareness that she can get modest dizziness if she stands too fast, she therefore takes increased time to coem to standing)  Ambulation/Gait Ambulation/Gait assistance: Independent;Supervision Ambulation Distance (Feet): 210 Feet Assistive device: None Gait Pattern/deviations: Step-through pattern;Decreased stride length Gait velocity: decreased Gait velocity interpretation: Below normal speed for age/gender General Gait Details: modest instability but able to self correct (of  note, ambulated in socks patient states that she always wears shoes and does note barefoot walk)  Stairs            Wheelchair Mobility    Modified Rankin (Stroke Patients Only) Modified Rankin (Stroke Patients Only) Pre-Morbid Rankin Score: No symptoms Modified Rankin: Moderate disability     Balance Overall balance assessment: Needs assistance   Sitting balance-Leahy Scale: Good       Standing balance-Leahy Scale: Good               High level balance activites: Side stepping;Backward walking;Direction changes;Turns;Sudden stops;Head turns High Level Balance Comments: modest instability noted, difficulty with looking vertically during ambulation Standardized Balance Assessment Standardized Balance Assessment : Dynamic Gait Index   Dynamic Gait Index Level Surface: Mild Impairment Change in Gait Speed: Mild Impairment Gait with Horizontal Head Turns: Mild Impairment Gait with Vertical Head Turns: Moderate Impairment Gait and Pivot Turn: Mild Impairment Step Over Obstacle: Mild Impairment Step Around Obstacles: Mild Impairment       Pertinent Vitals/Pain Pain Assessment: No/denies pain    Home Living Family/patient expects to be discharged to:: Private residence Living Arrangements: Alone Available Help at Discharge: Family;Friend(s) Type of Home: House Home Access: Level entry     Home Layout: One level Home Equipment: None      Prior Function Level of Independence: Independent         Comments: does not drive     Hand Dominance   Dominant Hand: Right    Extremity/Trunk Assessment               Lower Extremity Assessment: LLE deficits/detail      Cervical / Trunk Assessment: Kyphotic  Communication  Communication: No difficulties  Cognition Arousal/Alertness: Awake/alert Behavior During Therapy: WFL for tasks assessed/performed Overall Cognitive Status: Within Functional Limits for tasks assessed                       General Comments      Exercises        Assessment/Plan    PT Assessment Patent does not need any further PT services  PT Diagnosis Difficulty walking;Abnormality of gait   PT Problem List    PT Treatment Interventions     PT Goals (Current goals can be found in the Care Plan section) Acute Rehab PT Goals Patient Stated Goal: to go home PT Goal Formulation: With patient/family    Frequency     Barriers to discharge        Co-evaluation               End of Session Equipment Utilized During Treatment: Gait belt Activity Tolerance: Patient tolerated treatment well Patient left: in bed;with call bell/phone within reach;with bed alarm set;with family/visitor present (sitting EOB) Nurse Communication: Mobility status         Time: 1610-9604 PT Time Calculation (min) (ACUTE ONLY): 19 min   Charges:   PT Evaluation $Initial PT Evaluation Tier I: 1 Procedure     PT G CodesFabio Asa 2015/06/10, 8:28 AM  Charlotte Crumb, PT DPT  [phone]

## 2015-05-17 NOTE — Evaluation (Addendum)
Occupational Therapy Evaluation Patient Details Name: Joyce [name] MRN: [id number] DOB: 1931/10/31 Today's Date: 05/17/2015    History of Present Illness 81 y.o. female with a history of TIA two years ago who was talking on the phone at approximately 11:25am when she had sudden onset of slurred speech and right sided numbness. She was brought to the ER via EMS where a prominent right facial droop and severe dysarthria were observed.  Work up underway.   Clinical Impression   PTA pt lived at home and was independent with ADLs. Pt currently at Mod I level and reports symptoms have resolved. Educated on signs and symptoms of stroke and safety with ADLs at home. No further acute OT needs.     Follow Up Recommendations  No OT follow up    Equipment Recommendations  None recommended by OT    Recommendations for Other Services       Precautions / Restrictions Precautions Precautions: Fall Restrictions Weight Bearing Restrictions: No      Mobility Bed Mobility               General bed mobility comments: Pt sitting EOb  Transfers Overall transfer level: Modified independent                    Balance Overall balance assessment: No apparent balance deficits (not formally assessed)                                          ADL Overall ADL's : Modified independent                                       General ADL Comments: Pt at Mod I level with increased time. No physical assist needed. Educated on signs and symptoms using BE FAST and safety with ADLs including sitting for LB bathing and dressing.      Vision Additional Comments: No apparent changes   Perception     Praxis      Pertinent Vitals/Pain Pain Assessment: No/denies pain     Hand Dominance Right   Extremity/Trunk Assessment Upper Extremity Assessment Upper Extremity Assessment: Overall WFL for tasks assessed   Lower Extremity Assessment Lower  Extremity Assessment: Defer to PT evaluation   Cervical / Trunk Assessment Cervical / Trunk Assessment: Kyphotic   Communication Communication Communication: No difficulties   Cognition Arousal/Alertness: Awake/alert Behavior During Therapy: WFL for tasks assessed/performed Overall Cognitive Status: Within Functional Limits for tasks assessed                                Home Living Family/patient expects to be discharged to:: Private residence Living Arrangements: Alone Available Help at Discharge: Family;Friend(s) Type of Home: House Home Access: Level entry     Home Layout: One level               Home Equipment: None          Prior Functioning/Environment Level of Independence: Independent        Comments: does not drive    OT Diagnosis: Generalized weakness    End of Session  Activity Tolerance: Patient tolerated treatment well Patient left: with call bell/phone within reach;with family/visitor present (sitting EOB)   Time:  [id number] OT Time Calculation (min): 10 min Charges:  OT General Charges $OT Visit: 1 Procedure OT Evaluation $Initial OT Evaluation Tier I: 1 Procedure G-Codes:     05/17/15 1700  OT G-codes **NOT FOR INPATIENT CLASS**  Functional Assessment Tool Used clinical judgement  Functional Limitation Self care  Self Care [facility]7829[facility]G8987) [facility]  Self Care Goal Status (F6213(G8988) [facility]  Self Care Discharge Status [phone](G8989) [facility]    Rae LipsMiller, Josclyn Rosales M 05/17/2015, 5:34 PM  Yehuda MaoLeeAnn Guido SanderMarie Lakiyah Arntson, OTR/L Occupational Therapist [phone] (pager)

## 2015-05-17 NOTE — Progress Notes (Signed)
  Echocardiogram 2D Echocardiogram has been performed.  Joyce [name], Joyce [name] 05/17/2015, 4:52 PM

## 2015-05-17 NOTE — Progress Notes (Signed)
Heparin held due to pt medication study/trial. Spoke with pharmacy with possible interferance with the trial and they were unsure, but suggested to hold this medication until it is clarified by Dr. Pearlean BrownieSethi.

## 2015-05-17 NOTE — Progress Notes (Signed)
OT Cancellation Note  Patient Details Name: Marikay AlarJennie G Mittelstaedt MRN: [id number] DOB: 03-Aug-1931   Cancelled Treatment:    Reason Eval/Treat Not Completed: Patient at procedure or test/ unavailable. Pt off floor and acute OT will follow up as available to complete evaluation.   Nena JordanMiller, Garielle Mroz M   Carney LivingLeeAnn Marie Ioane Bhola, OTR/L Occupational Therapist [phone])  05/17/2015, 4:55 PM

## 2015-05-18 ENCOUNTER — Telehealth: Payer: Self-pay | Admitting: Family Medicine

## 2015-05-18 DIAGNOSIS — G451 Carotid artery syndrome (hemispheric): Secondary | ICD-10-CM | POA: Diagnosis not present

## 2015-05-18 DIAGNOSIS — G459 Transient cerebral ischemic attack, unspecified: Secondary | ICD-10-CM | POA: Diagnosis not present

## 2015-05-18 DIAGNOSIS — K219 Gastro-esophageal reflux disease without esophagitis: Secondary | ICD-10-CM | POA: Diagnosis not present

## 2015-05-18 DIAGNOSIS — I10 Essential (primary) hypertension: Secondary | ICD-10-CM | POA: Diagnosis not present

## 2015-05-18 DIAGNOSIS — Z8673 Personal history of transient ischemic attack (TIA), and cerebral infarction without residual deficits: Secondary | ICD-10-CM | POA: Diagnosis not present

## 2015-05-18 DIAGNOSIS — E785 Hyperlipidemia, unspecified: Secondary | ICD-10-CM | POA: Diagnosis not present

## 2015-05-18 LAB — HEMOGLOBIN A1C
Hgb A1c MFr Bld: 5.7 % — ABNORMAL HIGH (ref 4.8–5.6)
MEAN PLASMA GLUCOSE: 117 mg/dL

## 2015-05-18 MED ORDER — ASPIRIN 81 MG PO TBEC: 81.0000 mg | DELAYED_RELEASE_TABLET | Freq: Every day | ORAL | Status: DC

## 2015-05-18 MED ORDER — CLOPIDOGREL BISULFATE 75 MG PO TABS
75.0000 mg | ORAL_TABLET | Freq: Every day | ORAL | Status: DC
  Administered 2015-05-18: 75 mg via ORAL
  Filled 2015-05-18: qty 1

## 2015-05-18 MED ORDER — CLOPIDOGREL BISULFATE 75 MG PO TABS: 75.0000 mg | ORAL_TABLET | Freq: Every day | ORAL | Status: DC

## 2015-05-18 MED ORDER — ASPIRIN EC 81 MG PO TBEC: 81.0000 mg | DELAYED_RELEASE_TABLET | Freq: Every day | ORAL | Status: DC

## 2015-05-18 NOTE — Telephone Encounter (Signed)
I think it would be ok on the 6th of June or later but yes you may

## 2015-05-18 NOTE — Telephone Encounter (Signed)
Pt need a hosp fup may I use [facility]

## 2015-05-18 NOTE — Discharge Summary (Signed)
Physician Discharge Summary  Joyce [name] RUE:[id number] DOB: 1931-01-13 DOA: 05/16/2015  PCP: Tana ConchHUNTER, STEPHEN, MD  Admit date: 05/16/2015 Discharge date: 05/18/2015  Recommendations for Outpatient Follow-up:  1.   Discharge Diagnoses:  Principal Problem:   TIA (transient ischemic attack) Active Problems:   Hypothyroidism   Dyslipidemia   Essential hypertension   CAD (coronary artery disease)   GERD   Lumbar pain with radiation down left leg   Overactive bladder   History of stroke   Discharge Condition: stable  Diet recommendation: heart healthy  Filed Weights   05/16/15 1246  Weight: 67.2 kg (148 lb 2.4 oz)    History of present illness:   Joyce AlarJennie G Dewan is a 81 y.o. female with a history of TIA two hours ago who was talking on the phone at approximately 11:25am 05/16/2015 when she had sudden onset of slurred speech and right sided numbness. She was brought to the ER via EMS where a prominent right facial droop and severe dysarthria were observed. tPA was initially discussed, however the patient had improvement in her symptoms to the point that her symptoms were non-disabling and therefore tPA was not administered d/t rapidly improving symptoms. She was admitted for further evaluation and treatment. Hospital Course:  Admitted to hospitalists. Neurology consulted. MRI negative for anything acute. MRA showed left MCA M1 stenosis, progressed from MRA 12/14 2D echo  05/17/2015 Study Conclusions - Left ventricle: The cavity size was normal. Wall thickness was normal. The estimated ejection fraction was 60%. Wall motion was normal; there were no regional wall motion abnormalities. - Right ventricle: The cavity size was normal. Systolic function was normal. - Impressions: No cardiac source of embolism was identified, but cannot be ruled out on the basis of this examination. Impressions: - No cardiac source of embolism was identified, but cannot be ruled out on  the basis of this examination.  CTA head and neck - No significant carotid or vertebral artery stenosis in the neck. Moderate stenosis left M1 segment. Otherwise no significant intracranial stenosis or vascular malformation  LDL 164, statin intolerant. May discuss with PCP PCSK9 inhibitor   hgb A1C 5.7 Neurology recommends ASA 81 mg and plavix 75 mg for 3 months, then plavix alone  Procedures:  none  Consultations:  neurology  Discharge Exam: Filed Vitals:   05/18/15 0928  BP: 122/44  Pulse: 71  Temp: 97.9 F (36.6 C)  Resp: 18    General: a and o Cardiovascular: RRR Respiratory: CTA Neuro: nonfocal  Discharge Instructions   Discharge Instructions    Activity as tolerated - No restrictions    Complete by:  As directed      Diet - low sodium heart healthy    Complete by:  As directed           Current Discharge Medication List    START taking these medications   Details  aspirin EC 81 MG EC tablet Take 1 tablet (81 mg total) by mouth daily.    clopidogrel (PLAVIX) 75 MG tablet Take 1 tablet (75 mg total) by mouth daily. Qty: 30 tablet, Refills: 2      CONTINUE these medications which have NOT CHANGED   Details  amLODipine (NORVASC) 2.5 MG tablet TAKE ONE TABLET BY MOUTH ONCE DAILY Qty: 90 tablet, Refills: 3    atenolol (TENORMIN) 25 MG tablet TAKE ONE TABLET BY MOUTH TWICE DAILY Qty: 180 tablet, Refills: 3    Biotin 1000 MCG tablet Take 1,000 mcg by mouth daily.  CRANBERRY CONCENTRATE PO Take 1 tablet by mouth daily.    Lactobacillus (ACIDOPHILUS) 10 MG CAPS Take 1 capsule by mouth daily.     levothyroxine (SYNTHROID, LEVOTHROID) 100 MCG tablet Take 1 tablet (100 mcg total) by mouth daily. Qty: 180 tablet, Refills: 3    LORazepam (ATIVAN) 0.5 MG tablet Take 0.5 tablets (0.25 mg total) by mouth at bedtime as needed for anxiety. Qty: 45 tablet, Refills: 1    niacinamide 500 MG tablet Take 500 mg by mouth daily.    nitroGLYCERIN (NITROSTAT)  0.4 MG SL tablet Place 1 tablet (0.4 mg total) under the tongue every 5 (five) minutes as needed for chest pain. Qty: 25 tablet, Refills: 3   Associated Diagnoses: Chest pain, unspecified chest pain type    Omega-3 Fatty Acids (FISH OIL) 1000 MG CAPS Take 1 capsule by mouth daily.    oxybutynin (DITROPAN) 5 MG tablet Take 5 mg by mouth 2 (two) times daily.     Probiotic Product (ALIGN PO) Take 1 capsule by mouth daily.    pyridOXINE (B-6) 50 MG tablet Take 50 mg by mouth daily.    ranitidine (ZANTAC) 150 MG tablet TAKE ONE TABLET BY MOUTH TWICE DAILY AS NEEDED FOR HEARTBURN Qty: 180 tablet, Refills: 3    vitamin B-12 (CYANOCOBALAMIN) 500 MCG tablet Take 500 mcg by mouth daily.     vitamin E 400 UNIT capsule Take 400 Units by mouth daily.      STOP taking these medications     Investigational - Study Medication      INVESTIGATIONAL DRUG SIMPLE RECORD        Allergies  Allergen Reactions  . Loratadine Other (See Comments)    Gi upset  . Adhesive [Tape] Other (See Comments)    Bruising and severe irritation  . Aspirin Other (See Comments)    Gi upset  . Levaquin [Levofloxacin In D5w] Itching  . Levofloxacin Nausea And Vomiting  . Lumigan [Bimatoprost] Other (See Comments)    Severe burning of eyes  . Mobic [Meloxicam] Nausea And Vomiting and Other (See Comments)    hallucinations  . Penicillins Diarrhea and Nausea And Vomiting  . Statins Other (See Comments)    Severe muscle pain      The results of significant diagnostics from this hospitalization (including imaging, microbiology, ancillary and laboratory) are listed below for reference.    Significant Diagnostic Studies: Ct Angio Head W/cm &/or Wo Cm  05/17/2015   CLINICAL DATA:  TIA. Hypertension, dyslipidemia. Coronary artery disease. Sudden onset of slurred speech with right-sided numbness.  EXAM: CT ANGIOGRAPHY HEAD AND NECK  TECHNIQUE: Multidetector CT imaging of the head and neck was performed using the  standard protocol during bolus administration of intravenous contrast. Multiplanar CT image reconstructions and MIPs were obtained to evaluate the vascular anatomy. Carotid stenosis measurements (when applicable) are obtained utilizing NASCET criteria, using the distal internal carotid diameter as the denominator.  CONTRAST:  50mL OMNIPAQUE IOHEXOL 350 MG/ML SOLN  COMPARISON:  MRI 05/16/2015  FINDINGS: CT HEAD  Brain: Moderate atrophy. Negative for acute infarct. Negative for hemorrhage or mass.  Calvarium and skull base: Negative  Paranasal sinuses: Negative  Orbits: Negative  CTA NECK  Aortic arch: Atherosclerotic aortic arch with extensive calcification. Mild stenosis at the origin of the left common carotid artery. Proximal great vessels otherwise without significant stenosis. Apical emphysema.  Right carotid system: Atherosclerotic disease in the carotid bifurcation with calcified plaque in the carotid bulb. No significant carotid stenosis or dissection.  Left carotid system: Atherosclerotic calcification in the carotid bulb without significant stenosis or dissection  Vertebral arteries:Both vertebral arteries are widely patent to the basilar. Left vertebral dominant. Negative for atherosclerotic disease or stenosis.  Skeleton: No focal bony lesion.  Other neck: Negative  CTA HEAD  Anterior circulation: Mild atherosclerotic calcification in the cavernous carotid bilaterally without significant stenosis. Anterior cerebral arteries widely patent. Right middle cerebral artery patent without stenosis. Moderate stenosis distal left M1 segment. Left MCA branches patent.  Posterior circulation: Both vertebral arteries patent to the basilar. PICA patent bilaterally. Basilar widely patent. Superior cerebellar and posterior cerebral arteries patent bilaterally  Venous sinuses: Patent  Anatomic variants: Negative for cerebral aneurysm  Delayed phase: No enhancing lesion  IMPRESSION: No significant carotid or vertebral  artery stenosis in the neck.  Moderate stenosis left M1 segment. Otherwise no significant intracranial stenosis or vascular malformation.   Electronically Signed   By: Marlan Palau M.D.   On: 05/17/2015 15:06   Dg Chest 2 View  05/17/2015   CLINICAL DATA:  Stroke  EXAM: CHEST  2 VIEW  COMPARISON:  08/05/2012  FINDINGS: Chronic cardiomegaly. Negative aortic and hilar contours. Coronary stent noted.  There is no edema, consolidation, effusion, or pneumothorax.  No acute osseous findings.  IMPRESSION: 1. No active cardiopulmonary disease. 2. Chronic cardiomegaly.   Electronically Signed   By: Marnee Spring M.D.   On: 05/17/2015 01:18   Ct Head Wo Contrast  05/16/2015   CLINICAL DATA:  Code stroke. Right facial droop, right arm drift and slurred speech. Patient last seen in arm usual normal state approximately 1 hour 20 min ago. Prior history of stroke involving the junction of the right frontal and parietal lobes.  EXAM: CT HEAD WITHOUT CONTRAST  TECHNIQUE: Contiguous axial images were obtained from the base of the skull through the vertex without intravenous contrast.  COMPARISON:  MRI brain and intracranial MRA 11/25/2013. CT head 11/25/2013, 08/30/2007.  FINDINGS: Moderate cortical atrophy and mild deep atrophy, unchanged. No mass lesion. No midline shift. No acute hemorrhage or hematoma. No extra-axial fluid collections. No evidence of acute infarction. No significant interval change.  No skull fracture or other focal osseous abnormality involving the skull. Visualized paranasal sinuses, bilateral mastoid air cells and bilateral middle ear cavities well-aerated. Bilateral carotid siphon atherosclerosis.  IMPRESSION: 1. No acute intracranial abnormality. 2. Stable moderate cortical atrophy and mild deep atrophy. I telephoned these results at the time of interpretation on 05/16/2015 at 12:50 pm to Dr. Nicholes Calamity of the Neurology Service, who verbally acknowledged these results.   Electronically Signed   By:  Hulan Saas M.D.   On: 05/16/2015 12:51   Ct Angio Neck W/cm &/or Wo/cm  05/17/2015   CLINICAL DATA:  TIA. Hypertension, dyslipidemia. Coronary artery disease. Sudden onset of slurred speech with right-sided numbness.  EXAM: CT ANGIOGRAPHY HEAD AND NECK  TECHNIQUE: Multidetector CT imaging of the head and neck was performed using the standard protocol during bolus administration of intravenous contrast. Multiplanar CT image reconstructions and MIPs were obtained to evaluate the vascular anatomy. Carotid stenosis measurements (when applicable) are obtained utilizing NASCET criteria, using the distal internal carotid diameter as the denominator.  CONTRAST:  50mL OMNIPAQUE IOHEXOL 350 MG/ML SOLN  COMPARISON:  MRI 05/16/2015  FINDINGS: CT HEAD  Brain: Moderate atrophy. Negative for acute infarct. Negative for hemorrhage or mass.  Calvarium and skull base: Negative  Paranasal sinuses: Negative  Orbits: Negative  CTA NECK  Aortic arch: Atherosclerotic aortic arch with extensive  calcification. Mild stenosis at the origin of the left common carotid artery. Proximal great vessels otherwise without significant stenosis. Apical emphysema.  Right carotid system: Atherosclerotic disease in the carotid bifurcation with calcified plaque in the carotid bulb. No significant carotid stenosis or dissection.  Left carotid system: Atherosclerotic calcification in the carotid bulb without significant stenosis or dissection  Vertebral arteries:Both vertebral arteries are widely patent to the basilar. Left vertebral dominant. Negative for atherosclerotic disease or stenosis.  Skeleton: No focal bony lesion.  Other neck: Negative  CTA HEAD  Anterior circulation: Mild atherosclerotic calcification in the cavernous carotid bilaterally without significant stenosis. Anterior cerebral arteries widely patent. Right middle cerebral artery patent without stenosis. Moderate stenosis distal left M1 segment. Left MCA branches patent.   Posterior circulation: Both vertebral arteries patent to the basilar. PICA patent bilaterally. Basilar widely patent. Superior cerebellar and posterior cerebral arteries patent bilaterally  Venous sinuses: Patent  Anatomic variants: Negative for cerebral aneurysm  Delayed phase: No enhancing lesion  IMPRESSION: No significant carotid or vertebral artery stenosis in the neck.  Moderate stenosis left M1 segment. Otherwise no significant intracranial stenosis or vascular malformation.   Electronically Signed   By: Marlan Palau M.D.   On: 05/17/2015 15:06   Mr Maxine Glenn Head Wo Contrast  05/16/2015   CLINICAL DATA:  Sudden onset of slurred speech in RIGHT-sided numbness earlier today. History of cerebral ischemia 2 years ago.  EXAM: MRI HEAD WITHOUT CONTRAST  MRA HEAD WITHOUT CONTRAST  TECHNIQUE: Multiplanar, multiecho pulse sequences of the brain and surrounding structures were obtained without intravenous contrast. Angiographic images of the head were obtained using MRA technique without contrast.  COMPARISON:  CT head earlier today.  FINDINGS: MRI HEAD FINDINGS  No evidence for acute infarction, hemorrhage, mass lesion, hydrocephalus, or extra-axial fluid. Advanced cerebral and cerebellar atrophy. Mild subcortical and periventricular T2 and FLAIR hyperintensities, likely chronic microvascular ischemic change. Pituitary, pineal, and cerebellar tonsils unremarkable. No upper cervical lesions.  Flow voids are maintained throughout the carotid, basilar, and vertebral arteries. There are no areas of chronic hemorrhage. Visualized calvarium, skull base, and upper cervical osseous structures unremarkable. Scalp and extracranial soft tissues, orbits, sinuses, and mastoids show no acute process.  MRA HEAD FINDINGS  Internal carotid arteries are widely patent. Basilar artery widely patent with LEFT vertebral dominant of RIGHT vertebral contributory. No intracranial stenosis or aneurysm.  IMPRESSION: Chronic changes as  described.  No acute intracranial findings.  No proximal flow reducing lesion is evident.   Electronically Signed   By: Davonna Belling M.D.   On: 05/16/2015 15:58   Mr Brain Wo Contrast  05/16/2015   CLINICAL DATA:  Sudden onset of slurred speech in RIGHT-sided numbness earlier today. History of cerebral ischemia 2 years ago.  EXAM: MRI HEAD WITHOUT CONTRAST  MRA HEAD WITHOUT CONTRAST  TECHNIQUE: Multiplanar, multiecho pulse sequences of the brain and surrounding structures were obtained without intravenous contrast. Angiographic images of the head were obtained using MRA technique without contrast.  COMPARISON:  CT head earlier today.  FINDINGS: MRI HEAD FINDINGS  No evidence for acute infarction, hemorrhage, mass lesion, hydrocephalus, or extra-axial fluid. Advanced cerebral and cerebellar atrophy. Mild subcortical and periventricular T2 and FLAIR hyperintensities, likely chronic microvascular ischemic change. Pituitary, pineal, and cerebellar tonsils unremarkable. No upper cervical lesions.  Flow voids are maintained throughout the carotid, basilar, and vertebral arteries. There are no areas of chronic hemorrhage. Visualized calvarium, skull base, and upper cervical osseous structures unremarkable. Scalp and extracranial soft tissues, orbits,  sinuses, and mastoids show no acute process.  MRA HEAD FINDINGS  Internal carotid arteries are widely patent. Basilar artery widely patent with LEFT vertebral dominant of RIGHT vertebral contributory. No intracranial stenosis or aneurysm.  IMPRESSION: Chronic changes as described.  No acute intracranial findings.  No proximal flow reducing lesion is evident.   Electronically Signed   By: Davonna Belling M.D.   On: 05/16/2015 15:58    Microbiology: No results found for this or any previous visit (from the past 240 hour(s)).   Labs: Basic Metabolic Panel:  Recent Labs Lab 05/16/15 1230 05/16/15 1235  NA 137 138  K 4.1 4.0  CL 101 99*  CO2 26  --   GLUCOSE 121*  125*  BUN 9 10  CREATININE 0.62 0.60  CALCIUM 9.3  --    Liver Function Tests:  Recent Labs Lab 05/16/15 1230  AST 28  ALT 23  ALKPHOS 57  BILITOT 1.7*  PROT 7.8  ALBUMIN 3.8   No results for input(s): LIPASE, AMYLASE in the last 168 hours. No results for input(s): AMMONIA in the last 168 hours. CBC:  Recent Labs Lab 05/16/15 1230 05/16/15 1235  WBC 5.4  --   NEUTROABS 2.5  --   HGB 15.5* 17.0*  HCT 44.4 50.0*  MCV 87.1  --   PLT 176  --    Cardiac Enzymes: No results for input(s): CKTOTAL, CKMB, CKMBINDEX, TROPONINI in the last 168 hours. BNP: BNP (last 3 results) No results for input(s): BNP in the last 8760 hours.  ProBNP (last 3 results) No results for input(s): PROBNP in the last 8760 hours.  CBG:  Recent Labs Lab 05/16/15 1244  GLUCAP 126*       Signed:  Jaaziel Peatross L  [facility] 05/18/2015, 12:52 PM

## 2015-05-18 NOTE — Telephone Encounter (Signed)
lmovm to call back and schedule hosp fup

## 2015-05-18 NOTE — Progress Notes (Signed)
SLP Cancellation Note  Patient Details Name: Joyce [name] MRN: [id number] DOB: 05-Jul-1931   Cancelled treatment:       Reason Eval/Treat Not Completed: SLP screened, no needs identified, will sign off   Blenda MountsCouture, [name] [name] 05/18/2015, 9:54 AM

## 2015-05-18 NOTE — Progress Notes (Signed)
STROKE TEAM PROGRESS NOTE   HISTORY Joyce [name] is a 81 y.o. female with a history of TIA two hours ago who was talking on the phone at approximately 11:25am 05/16/2015 when she had sudden onset of slurred speech and right sided numbness. She was brought to the ER via EMS where a prominent right facial droop and severe dysarthria were observed. tPA was initially discussed, however the patient had improvement in her symptoms to the point that her symptoms were non-disabling and therefore tPA was not administered d/t rapidly improving symptoms.  She was admitted for further evaluation and treatment.   SUBJECTIVE (INTERVAL HISTORY) Family members at the bedside. Dr. Roda Shutters reviewed the most recent imaging with the patient and her family. He also discussed risk factors for cerebrovascular disease and made recommendations. The plan is for discharge today. Pt optioned out from clinical trial and will be put on dural antiplatelet for 3 months.    OBJECTIVE Temp:  [97.8 F (36.6 C)-99.3 F (37.4 C)] 97.9 F (36.6 C) (05/27 0928) Pulse Rate:  [51-71] 71 (05/27 0928) Cardiac Rhythm:  [-] Sinus bradycardia (05/26 2037) Resp:  [16-18] 18 (05/27 0928) BP: (122-141)/(38-46) 122/44 mmHg (05/27 0928) SpO2:  [94 %-100 %] 100 % (05/27 0928)   Recent Labs Lab 05/16/15 1244  GLUCAP 126*    Recent Labs Lab 05/16/15 1230 05/16/15 1235  NA 137 138  K 4.1 4.0  CL 101 99*  CO2 26  --   GLUCOSE 121* 125*  BUN 9 10  CREATININE 0.62 0.60  CALCIUM 9.3  --     Recent Labs Lab 05/16/15 1230  AST 28  ALT 23  ALKPHOS 57  BILITOT 1.7*  PROT 7.8  ALBUMIN 3.8    Recent Labs Lab 05/16/15 1230 05/16/15 1235  WBC 5.4  --   NEUTROABS 2.5  --   HGB 15.5* 17.0*  HCT 44.4 50.0*  MCV 87.1  --   PLT 176  --    No results for input(s): CKTOTAL, CKMB, CKMBINDEX, TROPONINI in the last 168 hours.  Recent Labs  05/16/15 1230  LABPROT 13.7  INR 1.03    Recent Labs  05/16/15 1316  COLORURINE  YELLOW  LABSPEC 1.008  PHURINE 8.0  GLUCOSEU NEGATIVE  HGBUR NEGATIVE  BILIRUBINUR NEGATIVE  KETONESUR NEGATIVE  PROTEINUR NEGATIVE  UROBILINOGEN 0.2  NITRITE NEGATIVE  LEUKOCYTESUR NEGATIVE       Component Value Date/Time   CHOL 231* 05/17/2015 0543   TRIG 84 05/17/2015 0543   HDL 50 05/17/2015 0543   CHOLHDL 4.6 05/17/2015 0543   VLDL 17 05/17/2015 0543   LDLCALC 164* 05/17/2015 0543   Lab Results  Component Value Date   HGBA1C 5.7* 05/17/2015      Component Value Date/Time   LABOPIA NONE DETECTED 05/16/2015 1316   COCAINSCRNUR NONE DETECTED 05/16/2015 1316   LABBENZ NONE DETECTED 05/16/2015 1316   AMPHETMU NONE DETECTED 05/16/2015 1316   THCU NONE DETECTED 05/16/2015 1316   LABBARB NONE DETECTED 05/16/2015 1316     Recent Labs Lab 05/16/15 1230  ETH <5   I have personally reviewed the radiological images below and agree with the radiology interpretations. Blue text is Dr. Warren Danes interpretation.  Dg Chest 2 View 05/17/2015   1. No active cardiopulmonary disease. 2. Chronic cardiomegaly.     Ct Head Wo Contrast 05/16/2015   1. No acute intracranial abnormality. 2. Stable moderate cortical atrophy and mild deep atrophy.   Mr Brain Wo Contrast 05/16/2015   Chronic  changes as described.  No acute intracranial findings.    Mr Shirlee LatchMra Head Wo Contrast 05/16/2015   Left MCA M1 stenosis, progressed from MRA done 11/2013.      2D echo  05/17/2015 Study Conclusions - Left ventricle: The cavity size was normal. Wall thickness was normal. The estimated ejection fraction was 60%. Wall motion was normal; there were no regional wall motion abnormalities. - Right ventricle: The cavity size was normal. Systolic function was normal. - Impressions: No cardiac source of embolism was identified, but cannot be ruled out on the basis of this examination. Impressions: - No cardiac source of embolism was identified, but cannot be ruled out on the basis of this  examination.  CTA head and neck - No significant carotid or vertebral artery stenosis in the neck. Moderate stenosis left M1 segment. Otherwise no significant intracranial stenosis or vascular malformation.  PHYSICAL EXAM Physical exam  Temp:  [97.8 F (36.6 C)-99.3 F (37.4 C)] 97.9 F (36.6 C) (05/27 0928) Pulse Rate:  [51-71] 71 (05/27 0928) Resp:  [16-18] 18 (05/27 0928) BP: (122-141)/(38-46) 122/44 mmHg (05/27 0928) SpO2:  [94 %-100 %] 100 % (05/27 0928)  General - Well nourished, well developed, in no apparent distress.  Ophthalmologic - Sharp disc margins OU.   Cardiovascular - Regular rate and rhythm with no murmur.  Mental Status -  Level of arousal and orientation to time, place, and person were intact. Language including expression, naming, repetition, comprehension was assessed and found intact. Fund of Knowledge was assessed and was intact.  Cranial Nerves II - XII - II - Visual field intact OU. III, IV, VI - Extraocular movements intact. V - Facial sensation intact bilaterally. VII - Facial movement intact bilaterally. VIII - Hearing & vestibular intact bilaterally. X - Palate elevates symmetrically. XI - Chin turning & shoulder shrug intact bilaterally. XII - Tongue protrusion intact.  Motor Strength - The patient's strength was normal in all extremities and pronator drift was absent.  Bulk was normal and fasciculations were absent.   Motor Tone - Muscle tone was assessed at the neck and appendages and was normal.  Reflexes - The patient's reflexes were 1+ in all extremities and she had no pathological reflexes.  Sensory - Light touch, temperature/pinprick, vibration and proprioception, and Romberg testing were assessed and were symmetrical.    Coordination - The patient had normal movements in the hands and feet with no ataxia or dysmetria.  Tremor was absent.  Gait and Station - The patient's transfers, posture, gait, station, and turns were observed as  normal.   ASSESSMENT/PLAN Joyce [name] is a 81 y.o. female with history of TIA presenting with slurred speech and right sided numbness. She did not receive IV t-PA due to rapidly improving symptoms.   TIA - likely due to left M1 stenosis which is progressed from 11/2013  Resultant  Neuro deficits resolved  MRI  No acute stroke  MRA  Left M1 stenosis, progressed from 11/2013  CTA head and neck showed left M1 moderate stenosis.  2D Echo  no cardiac source of emboli identified  LDL 164, not at goal  HgbA1c 5.7  SCDs ordered for VTE prophylaxis. Cannot take any heparinoids d/t research study. Diet Heart Room service appropriate?: Yes; Fluid consistency:: Thin Diet - low sodium heart healthy  RE-SPECT ESUS (Pradaxa vs ASA) prior to admission, continued in hospital. As no stroke this time, pt can remain in the study. However, she optioned out from study this time. Recommend  aspirin 81 mg daily with Plavix 75 mg daily for 3 months then Plavix alone.  Patient counseled to be compliant with her study antithrombotic medications  Ongoing aggressive stroke risk factor management  Therapy recommendations:  No PT  Disposition:  Return home  Hypertension  Home meds:   Norvasc, tenormin,   Currently on atenolol  Stable  Hyperlipidemia  Home meds:  Lovaza, niacin, resumed in hospital  LDL 164, goal < 70  No statin due to known intolerance  Pt needs to discuss with PCP regarding PCSK9 inhibitors for risk factor control.  Other Stroke Risk Factors  Advanced age  Hx TIA/stroke  Coronary artery disease  Hx Obstructive sleep apnea, on CPAP until lost weight  Hospital day # 2   Neurology will sign off. Please call with questions. Pt will follow up with Dr. Pearlean Brownie for research follow up and clinical follow up in [facility]. Thanks for the consult.   Marvel Plan, MD PhD Stroke Neurology 05/19/2015 6:38 AM           To contact Stroke Continuity provider, please  refer to [email or web address]. After hours, contact General Neurology

## 2015-05-18 NOTE — Progress Notes (Signed)
RESPECT ESUS STUDY  NOTe I spoke to the patient and her daughter Lafonda MossesDiana regarding her present clinical presentation of left hemispheric TIA with new finding of moderate left MCA stenosis likely symptomatic hence I recommend discontinuing the RESPECT ESUS study medication and changing to aspirin 81 mg plus Plavix 75 mg for 3 months followed by Plavix alone. TIA is not a study end point but at clinical discretion of the principal investigator the study medication is being stopped as patient and family strongly feel they want to try another medicine. Patient will follow-up in the office as per study protocol.  Delia HeadyPramod Sethi, MD

## 2015-05-18 NOTE — Progress Notes (Signed)
Pt is being discharged home. Discharge instructions were given to patient and family 

## 2015-05-19 ENCOUNTER — Other Ambulatory Visit: Payer: Self-pay | Admitting: Neurology

## 2015-05-19 ENCOUNTER — Ambulatory Visit (INDEPENDENT_AMBULATORY_CARE_PROVIDER_SITE_OTHER): Payer: Medicare Other | Admitting: Family Medicine

## 2015-05-19 VITALS — BP 144/64 | HR 75 | Temp 98.1°F | Resp 16 | Ht 58.5 in | Wt 143.0 lb

## 2015-05-19 DIAGNOSIS — L0390 Cellulitis, unspecified: Secondary | ICD-10-CM | POA: Diagnosis not present

## 2015-05-19 DIAGNOSIS — G451 Carotid artery syndrome (hemispheric): Secondary | ICD-10-CM

## 2015-05-19 MED ORDER — CLINDAMYCIN HCL 150 MG PO CAPS: 150.0000 mg | ORAL_CAPSULE | Freq: Four times a day (QID) | ORAL | Status: DC

## 2015-05-19 NOTE — Patient Instructions (Signed)
Apply warm compresses 3-5 times daily  Take the clindamycin 150 mg 4 times daily. If you develop diarrhea discontinue medication and contact us.  In the event of high fever, chills, or just getting sicker from the arm rash go to the emergency room. If the area of redness is increasing and not improving by Monday please return for recheck

## 2015-05-19 NOTE — Progress Notes (Signed)
  Subjective:  Patient ID: Joyce [name], female    DOB: 09-30-1931  Age: 81 y.o. MRN: [id number]  Patient was [facility] with a TIA on Wednesday. She had right facial drooping. She had the various studies done with MRI and MRA and CT and angiography. She was found to have some secondary left vascular disease, but nothing that needed to be opened up. She was treated with aspirin and Plavix. She had an IV in the right antecubital fossa. She has now flared up with a large area of redness and tenderness.   Objective:   Large area of cellulitis right antecubital fossa measuring 14 cm long and 12,.5 cm wide. There is significant induration above the area where the IV actually was inserted. The patient seemed to doing fine with no facial droop at this point.  Assessment & Plan:   Assessment: Cellulitis right forearm secondary to IV site, could be infectious, could be from medication or dye infiltration TIA resolved  Plan: Patient Instructions  Apply warm compresses 3-5 times daily  Take the clindamycin 150 mg 4 times daily. If you develop diarrhea discontinue medication and contact us.  In the event of high fever, chills, or just getting sicker from the arm rash go to the emergency room. If the area of redness is increasing and not improving by Monday please return for recheck     HOPPER,DAVID, MD 05/19/2015

## 2015-05-22 ENCOUNTER — Telehealth: Payer: Self-pay | Admitting: Neurology

## 2015-05-22 NOTE — Telephone Encounter (Signed)
I spoke to the patient to schedule an appointment for the Respect-Esus research study. Appointment was scheduled for Wednesday, [id number] at 14:30h with Dr. Roda ShuttersXu.

## 2015-05-24 ENCOUNTER — Encounter (INDEPENDENT_AMBULATORY_CARE_PROVIDER_SITE_OTHER): Payer: Self-pay | Admitting: Neurology

## 2015-05-24 DIAGNOSIS — Z0289 Encounter for other administrative examinations: Secondary | ICD-10-CM

## 2015-05-31 NOTE — Telephone Encounter (Signed)
Error

## 2015-06-05 ENCOUNTER — Ambulatory Visit (INDEPENDENT_AMBULATORY_CARE_PROVIDER_SITE_OTHER): Payer: Medicare Other | Admitting: Family Medicine

## 2015-06-05 ENCOUNTER — Encounter: Payer: Self-pay | Admitting: Family Medicine

## 2015-06-05 VITALS — BP 140/62 | HR 55 | Temp 98.4°F | Wt 145.0 lb

## 2015-06-05 DIAGNOSIS — E785 Hyperlipidemia, unspecified: Secondary | ICD-10-CM | POA: Diagnosis not present

## 2015-06-05 DIAGNOSIS — M545 Low back pain: Secondary | ICD-10-CM | POA: Diagnosis not present

## 2015-06-05 DIAGNOSIS — M79605 Pain in left leg: Secondary | ICD-10-CM

## 2015-06-05 DIAGNOSIS — G459 Transient cerebral ischemic attack, unspecified: Secondary | ICD-10-CM

## 2015-06-05 NOTE — Assessment & Plan Note (Signed)
Normal neuro exam today. Need to work on lowering lipids. BP hair high but left leg discomfort likely driving up. Continue as neuro advised- asa/plavix 3 months then plavix alone. Has close neuro follow up.

## 2015-06-05 NOTE — Progress Notes (Signed)
Tana Conch, MD  Subjective:  Joyce [name] is a 81 y.o. year old very pleasant female patient who presents with:  TIA hospital follow up -hospitalized for TIA from 5/25-5/27/16. Presented with dysarthria and right sided numbness, right facial droop. Considered tPA btu symptoms quickly improved within a few hours and tPA not given. Complete resolution within 24 hours. MRI negative for acute findings but did have left MCA M1 stenosis that did not require intervention. 2d echo EF 60% otherwise largely normal with no cardiac source found. Patient was discharged on asa and plavix together for 3 months then transition to plavix alone. She is in a research study as well. a1c mildly high at 5.7 but no DM. Carotid Dopplers not performed that I can see. 2014 1-39% stenosis ROS- no dysarthria, dysphagia, facial droop, paresthesias  Hyperlipidemia-poor control as statin intolerant  Lab Results  Component Value Date   LDLCALC 164* 05/17/2015  ROS- no chest pain or shortness of breath. No myalgias  Left radiculopathy-recent worsening History radiculopathy with pain worsened while in hospital. Left low back with numbness tingling down whole left leg.  ROS- no fecal or urinary incontinence or leg weakness  Past Medical History- hypothyroidism, GERD, history CVA  Medications- reviewed and updated Current Outpatient Prescriptions  Medication Sig Dispense Refill  . amLODipine (NORVASC) 2.5 MG tablet TAKE ONE TABLET BY MOUTH ONCE DAILY 90 tablet 3  . aspirin EC 81 MG EC tablet Take 1 tablet (81 mg total) by mouth daily.    Marland Kitchen atenolol (TENORMIN) 25 MG tablet TAKE ONE TABLET BY MOUTH TWICE DAILY 180 tablet 3  . Biotin 1000 MCG tablet Take 1,000 mcg by mouth daily.    . clopidogrel (PLAVIX) 75 MG tablet Take 1 tablet (75 mg total) by mouth daily. 30 tablet 2  . CRANBERRY CONCENTRATE PO Take 1 tablet by mouth daily.    . Lactobacillus (ACIDOPHILUS) 10 MG CAPS Take 1 capsule by mouth daily.     Marland Kitchen  levothyroxine (SYNTHROID, LEVOTHROID) 100 MCG tablet Take 1 tablet (100 mcg total) by mouth daily. 180 tablet 3  . niacinamide 500 MG tablet Take 500 mg by mouth daily.    . Omega-3 Fatty Acids (FISH OIL) 1000 MG CAPS Take 1 capsule by mouth daily.    Marland Kitchen oxybutynin (DITROPAN) 5 MG tablet Take 5 mg by mouth 2 (two) times daily.     . Probiotic Product (ALIGN PO) Take 1 capsule by mouth daily.    Marland Kitchen pyridOXINE (B-6) 50 MG tablet Take 50 mg by mouth daily.    . ranitidine (ZANTAC) 150 MG tablet TAKE ONE TABLET BY MOUTH TWICE DAILY AS NEEDED FOR HEARTBURN (Patient taking differently: Take 150 mg by mouth 2 (two) times daily. TAKE ONE TABLET BY MOUTH TWICE DAILY AS NEEDED FOR HEARTBURN) 180 tablet 3  . vitamin B-12 (CYANOCOBALAMIN) 500 MCG tablet Take 500 mcg by mouth daily.     . vitamin E 400 UNIT capsule Take 400 Units by mouth daily.    Marland Kitchen LORazepam (ATIVAN) 0.5 MG tablet Take 0.5 tablets (0.25 mg total) by mouth at bedtime as needed for anxiety. (Patient not taking: Reported on 06/05/2015) 45 tablet 1  . nitroGLYCERIN (NITROSTAT) 0.4 MG SL tablet Place 1 tablet (0.4 mg total) under the tongue every 5 (five) minutes as needed for chest pain. (Patient not taking: Reported on 06/05/2015) 25 tablet 3   No current facility-administered medications for this visit.    Objective: BP 140/62 mmHg  Pulse 55  Temp(Src)  98.4 F (36.9 C)  Wt 145 lb (65.772 kg)  LMP  (LMP Unknown) Gen: NAD, some discomfort due to L sided back and leg pain CV: RRR no murmurs rubs or gallops Lungs: CTAB no crackles, wheeze, rhonchi Abdomen: soft/nontender/nondistended/normal bowel sounds.  Ext: no edema Skin: warm, dry Neuro: CN II-XII intact, sensation and reflexes normal throughout, 5/5 muscle strength in bilateral upper and lower extremities (no left leg weakness though some reported). Normal finger to nose.   Assessment/Plan:  TIA (transient ischemic attack) Normal neuro exam today. Need to work on lowering lipids. BP  hair high but left leg discomfort likely driving up. Continue as neuro advised- asa/plavix 3 months then plavix alone. Has close neuro follow up.   Dyslipidemia I will reach out to Dr. Excell Seltzer her cardiologist and see if we can get patient set up for PCSK9 inhibitor. With CAD and CVA history with recent TIA, think we should push for this  Lumbar pain with radiation down left leg Last seen orthopedics many years ago per Dr. Lovell Sheehan notes- doesn't remember which group. Daughter sees Dr. Claria Dice of [facility] and asks for referral to that office which was provided. i think this was worsened by being in hospital bed for 2 days.    Orders Placed This Encounter  Procedures  . Ambulatory referral to Orthopedic Surgery    Referral Priority:  Routine    Referral Type:  Surgical    Referral Reason:  Specialty Services Required    Requested Specialty:  Orthopedic Surgery    Number of Visits Requested:  1

## 2015-06-05 NOTE — Assessment & Plan Note (Signed)
Last seen orthopedics many years ago per Dr. Lovell Sheehan notes- doesn't remember which group. Daughter sees Dr. Claria Dice of [facility] and asks for referral to that office which was provided. i think this was worsened by being in hospital bed for 2 days.

## 2015-06-05 NOTE — Assessment & Plan Note (Signed)
I will reach out to Dr. Excell Seltzer her cardiologist and see if we can get patient set up for PCSK9 inhibitor. With CAD and CVA history with recent TIA, think we should push for this

## 2015-06-05 NOTE — Patient Instructions (Signed)
Continue plavix and aspirin. Continue plavix permanently. Stop aspirin on 08/18/15  I will reach out to Dr. Excell Seltzer and hopefully get back to you by sometime next week about injectable cholesterol medicine  We will call you within a week about your referral to [facility] orthopedics. If you do not hear within 2 weeks, give [place] a call.

## 2015-06-06 ENCOUNTER — Ambulatory Visit: Payer: Medicare Other | Admitting: Family Medicine

## 2015-06-07 ENCOUNTER — Ambulatory Visit: Payer: Medicare Other | Admitting: Family Medicine

## 2015-06-15 ENCOUNTER — Telehealth: Payer: Self-pay | Admitting: Family Medicine

## 2015-06-15 NOTE — Telephone Encounter (Signed)
Can cancel. How about we follow up in about 3 months? Thanks

## 2015-06-15 NOTE — Telephone Encounter (Signed)
Pt has a visit on 6/14. Pt has a previous appt scheduled for 7/11.  Pt states Everything got settled and she does not fell like she needs to keep this appt.. So soes she need to keep this apt or pls advise when does pt to fu?

## 2015-06-15 NOTE — Telephone Encounter (Signed)
Does pt need to keep 07/11 OV?

## 2015-06-21 ENCOUNTER — Ambulatory Visit (INDEPENDENT_AMBULATORY_CARE_PROVIDER_SITE_OTHER): Payer: Medicare Other | Admitting: Pharmacist

## 2015-06-21 DIAGNOSIS — E785 Hyperlipidemia, unspecified: Secondary | ICD-10-CM | POA: Diagnosis not present

## 2015-06-21 NOTE — Progress Notes (Signed)
HPI:  [name] is an 81 year old woman patient of Dr. Excell Seltzerooper who was referred to the lipid clinic for evaluation for PCSK-9.  She has a PMH significant for CAD s/p PCI in 2002, hypertension, and stroke in 11/2013 and 04/2015.   She has a long history of statin intolerance and states she "tried everything on the market"  She tried Crestor 10mg  on Monday and Friday in 2010 and had muscle pains in legs and stomach.  She failed naicin due to flushing.  She tried Welchol from 2012-2013 but this was stopped because it was ineffective at controlling her cholesterol.  She is currently taking niacinamide and fish oil.    RF: age, CAD, CVA, HTN Goals: LDL goal<70, non-HDL <100 Current Therapy: fish oil  Intolerances: Crestor 10mg  twice a week (2010)- myalgias, Niacin 250mg  daily (04/11/93-09/22/96)- flushing, Welchol 1250mg  TID (09/08/00-02/10/09)- ineffective, lovastatin 20mg  daily (04/11/93-?)- myalgias and ineffective, pravastatin 40mg  (1/15-01-02/18/00)- myalgias, Pravastatin 20mg  daily (03/17/00-08/29/00)- myalgias, Lipitor 10mg  daily (05/31/02-6/22-04)- mylagias   Labs:  04/2015: TC 231, TG 84, HDL 50, LDL 164 (fish oil)   Outpatient Encounter Prescriptions as of 06/21/2015  Medication Sig  . amLODipine (NORVASC) 2.5 MG tablet TAKE ONE TABLET BY MOUTH ONCE DAILY  . aspirin EC 81 MG EC tablet Take 1 tablet (81 mg total) by mouth daily.  Marland Kitchen. atenolol (TENORMIN) 25 MG tablet TAKE ONE TABLET BY MOUTH TWICE DAILY  . Biotin 1000 MCG tablet Take 1,000 mcg by mouth daily.  . clopidogrel (PLAVIX) 75 MG tablet Take 1 tablet (75 mg total) by mouth daily.  Marland Kitchen. CRANBERRY CONCENTRATE PO Take 1 tablet by mouth daily.  . Lactobacillus (ACIDOPHILUS) 10 MG CAPS Take 1 capsule by mouth daily.   Marland Kitchen. levothyroxine (SYNTHROID, LEVOTHROID) 100 MCG tablet Take 1 tablet (100 mcg total) by mouth daily.  Marland Kitchen. LORazepam (ATIVAN) 0.5 MG tablet Take 0.5 tablets (0.25 mg total) by mouth at bedtime as needed for anxiety. (Patient not taking:  Reported on 06/05/2015)  . niacinamide 500 MG tablet Take 500 mg by mouth daily.  . nitroGLYCERIN (NITROSTAT) 0.4 MG SL tablet Place 1 tablet (0.4 mg total) under the tongue every 5 (five) minutes as needed for chest pain. (Patient not taking: Reported on 06/05/2015)  . Omega-3 Fatty Acids (FISH OIL) 1000 MG CAPS Take 1 capsule by mouth daily.  Marland Kitchen. oxybutynin (DITROPAN) 5 MG tablet Take 5 mg by mouth 2 (two) times daily.   . Probiotic Product (ALIGN PO) Take 1 capsule by mouth daily.  Marland Kitchen. pyridOXINE (B-6) 50 MG tablet Take 50 mg by mouth daily.  . ranitidine (ZANTAC) 150 MG tablet TAKE ONE TABLET BY MOUTH TWICE DAILY AS NEEDED FOR HEARTBURN (Patient taking differently: Take 150 mg by mouth 2 (two) times daily. TAKE ONE TABLET BY MOUTH TWICE DAILY AS NEEDED FOR HEARTBURN)  . vitamin B-12 (CYANOCOBALAMIN) 500 MCG tablet Take 500 mcg by mouth daily.   . vitamin E 400 UNIT capsule Take 400 Units by mouth daily.   No facility-administered encounter medications on file as of 06/21/2015.    Allergies  Allergen Reactions  . Loratadine Other (See Comments)    Gi upset  . Adhesive [Tape] Other (See Comments)    Bruising and severe irritation  . Aspirin Other (See Comments)    Gi upset  . Levaquin [Levofloxacin In D5w] Itching  . Levofloxacin Nausea And Vomiting  . Lumigan [Bimatoprost] Other (See Comments)    Severe burning of eyes  . Mobic [Meloxicam] Nausea And Vomiting  and Other (See Comments)    hallucinations  . Penicillins Diarrhea and Nausea And Vomiting  . Statins Other (See Comments)    Severe muscle pain      ASSESSMENT AND PLAN: 1. Hyperlipidemia- Stop niacinamide given no cholesterol benefits.  Pt has failed practically every orally available medication to treat cholesterol.  Given she continues to have atherosclerotic events, she would like to continue to be aggressive with therapy.  Discussed role of PCSK-9.  She is agreeable to try.  She is aware of the potential cost associated  with therapy.  Will start benefits investigation for patient.   Evie Lacks 06/21/2015 3:57 PM

## 2015-06-26 ENCOUNTER — Ambulatory Visit: Payer: Medicare Other | Admitting: Family Medicine

## 2015-07-02 ENCOUNTER — Encounter: Payer: Self-pay | Admitting: Neurology

## 2015-07-02 ENCOUNTER — Ambulatory Visit (INDEPENDENT_AMBULATORY_CARE_PROVIDER_SITE_OTHER): Payer: Medicare Other | Admitting: Neurology

## 2015-07-02 VITALS — BP 165/60 | HR 49 | Ht 58.5 in | Wt 145.8 lb

## 2015-07-02 DIAGNOSIS — G451 Carotid artery syndrome (hemispheric): Secondary | ICD-10-CM

## 2015-07-02 DIAGNOSIS — I10 Essential (primary) hypertension: Secondary | ICD-10-CM | POA: Diagnosis not present

## 2015-07-02 DIAGNOSIS — M5410 Radiculopathy, site unspecified: Secondary | ICD-10-CM | POA: Diagnosis not present

## 2015-07-02 DIAGNOSIS — E785 Hyperlipidemia, unspecified: Secondary | ICD-10-CM | POA: Insufficient documentation

## 2015-07-02 DIAGNOSIS — I679 Cerebrovascular disease, unspecified: Secondary | ICD-10-CM

## 2015-07-02 HISTORY — DX: Cerebrovascular disease, unspecified: I67.9

## 2015-07-02 NOTE — Patient Instructions (Addendum)
-   continue ASA and plavix for another 2 months and then plavix after - check BP at home for 2 weeks and record and bring over to PCP - continue follow up with the injectable medication for high cholesterol - healthy diet and regular exercise - Follow up with your primary care physician for stroke risk factor modification. Recommend maintain blood pressure goal <130/80, diabetes with hemoglobin A1c goal below 6.5% and lipids with LDL cholesterol goal below 70 mg/dL.  - follow up with Dr. Pearlean BrownieSethi on 09/26/15

## 2015-07-02 NOTE — Progress Notes (Signed)
STROKE NEUROLOGY FOLLOW UP NOTE  NAME: Joyce [name] DOB: Apr 17, 1931  REASON FOR VISIT: stroke follow up HISTORY FROM: pt and chart  Today we had the pleasure of seeing Joyce [name] in follow-up at our Neurology Clinic. Pt was accompanied by daughter.   History Summary Joyce [name] is a 81 y.o. female with history of acute small right frontal-parietal cortical stroke in 11/2013 and enrolled in POINT trial at that time. Later on, she was also enrolled to RESPECT ESUS trial with Dr. Pearlean BrownieSethi. However, she was admitted on 05/16/15 for slurry speech and right sided numbness which rapidly improved and resolved. She was not given tPA. MRI showed no stroke but MRA showed left MCA M1 stenosed with more progression comparing with the MRA in 2014. Repeat CT confirmed the stenosis. In light of this progression, pt optioned out of the RESPECT ESUS trial and was put on dural antiplatelets. Her LDL still high at 160, but intolerant with statins. She was recommend to discuss with PCP for PCSK9 inhibitors.  Interval History During the interval time, the patient has been doing well. No recurrent symptoms. She is working with PCP and cardiologist to get PCSK9 inhibitors. She is on dural antiplatelets. Her BP is high today 165/60 and she is not checking BP at home but on BP meds.   REVIEW OF SYSTEMS: Full 14 system review of systems performed and notable only for those listed below and in HPI above, all others are negative:  Constitutional:   Cardiovascular:  Ear/Nose/Throat:   Skin:  Eyes:   Respiratory:   Gastroitestinal:   Genitourinary:  Hematology/Lymphatic:   Endocrine:  Musculoskeletal:   Allergy/Immunology:   Neurological:   Psychiatric:  Sleep:   The following represents the patient's updated allergies and side effects list: Allergies  Allergen Reactions  . Loratadine Other (See Comments)    Gi upset  . Adhesive [Tape] Other (See Comments)    Bruising and severe irritation  .  Aspirin Other (See Comments)    Gi upset  . Levaquin [Levofloxacin In D5w] Itching  . Levofloxacin Nausea And Vomiting  . Lumigan [Bimatoprost] Other (See Comments)    Severe burning of eyes  . Mobic [Meloxicam] Nausea And Vomiting and Other (See Comments)    hallucinations  . Penicillins Diarrhea and Nausea And Vomiting  . Statins Other (See Comments)    Severe muscle pain    The neurologically relevant items on the patient's problem list were reviewed on today's visit.  Neurologic Examination  A problem focused neurological exam (12 or more points of the single system neurologic examination, vital signs counts as 1 point, cranial nerves count for 8 points) was performed.  Blood pressure 165/60, pulse 49, height 4' 10.5" (1.486 m), weight 145 lb 12.8 oz (66.134 kg).  General - Well nourished, well developed, in no apparent distress.  Ophthalmologic - Sharp disc margins OU.   Cardiovascular - Regular rate and rhythm with no murmur.  Mental Status -  Level of arousal and orientation to time, place, and person were intact. Language including expression, naming, repetition, comprehension was assessed and found intact. Fund of Knowledge was assessed and was intact.  Cranial Nerves II - XII - II - Visual field intact OU. III, IV, VI - Extraocular movements intact. V - Facial sensation intact bilaterally. VII - Facial movement intact bilaterally. VIII - Hearing & vestibular intact bilaterally. X - Palate elevates symmetrically. XI - Chin turning & shoulder shrug intact bilaterally. XII - Tongue  protrusion intact.  Motor Strength - The patient's strength was normal in all extremities and pronator drift was absent.  Bulk was normal and fasciculations were absent.   Motor Tone - Muscle tone was assessed at the neck and appendages and was normal.  Reflexes - The patient's reflexes were 1+ in all extremities and she had no pathological reflexes.  Sensory - Light touch,  temperature/pinprick, vibration and proprioception, and Romberg testing were assessed and were normal.    Coordination - The patient had normal movements in the hands and feet with no ataxia or dysmetria.  Tremor was absent.  Gait and Station - The patient's transfers, posture, gait, station, and turns were observed as normal.  Data reviewed: I personally reviewed the images and agree with the radiology interpretations.  Dg Chest 2 View 05/17/2015 1. No active cardiopulmonary disease. 2. Chronic cardiomegaly.   Ct Head Wo Contrast 05/16/2015 1. No acute intracranial abnormality. 2. Stable moderate cortical atrophy and mild deep atrophy.   Mri and Mra Brain Wo Contrast 05/16/2015  Chronic changes as described. No acute intracranial findings.  Left MCA M1 stenosis, progressed from MRA done 11/2013.   11/25/13  Acute small nonhemorrhagic infarct at the junction of the right frontal -parietal lobe. Tiny aneurysm superior aspect of the right internal carotid artery cavernous segment suspected.  2D echo  05/17/2015 Study Conclusions - Left ventricle: The cavity size was normal. Wall thickness was normal. The estimated ejection fraction was 60%. Wall motion was normal; there were no regional wall motion abnormalities. - Right ventricle: The cavity size was normal. Systolic function was normal. - Impressions: No cardiac source of embolism was identified, but cannot be ruled out on the basis of this examination. Impressions: - No cardiac source of embolism was identified, but cannot be ruled out on the basis of this examination.  CTA head and neck - No significant carotid or vertebral artery stenosis in the neck. Moderate stenosis left M1 segment. Otherwise no significant intracranial stenosis or vascular malformation.  Component     Latest Ref Rng 11/25/2013 11/26/2013 05/17/2015  Cholesterol     0 - 200 mg/dL  161 (H) 096 (H)  Triglycerides     <150 mg/dL   67 84  HDL Cholesterol     >40 mg/dL  55 50  Total CHOL/HDL Ratio       3.7 4.6  VLDL     0 - 40 mg/dL  13 17  LDL (calc)     0 - 99 mg/dL  045 (H) 409 (H)  Hemoglobin A1C     4.8 - 5.6 % 5.6  5.7 (H)  Mean Plasma Glucose      114  117    Assessment: As you may recall, she is a 81 y.o. Caucasian female with PMH of stroke 2014 on RESPECT ESUS trial, HDL, HTN was admitted on 05/16/15 for TIA. MRI no stroke but MRA showed progression of left M1 stenosis. Pt optioned out of RESPECT ESUS trial and put on dural antiplatelets. Also LDL 164 not tolerating statin, currently working on PCSK9 inhibitors. BP not in good control but clinically doing well.   Plan:  - continue ASA and plavix for total 3 months and then plavix alone - check BP at home for 2 weeks and record and bring over to PCP - continue follow up with PCSK9 inhibitor for high cholesterol - healthy diet and regular exercise - Follow up with your primary care physician for stroke risk factor modification. Recommend  maintain blood pressure goal <130/80, diabetes with hemoglobin A1c goal below 6.5% and lipids with LDL cholesterol goal below 70 mg/dL.  - follow up as scheduled with Dr. Pearlean Brownie on 09/26/15  No orders of the defined types were placed in this encounter.    No orders of the defined types were placed in this encounter.    Patient Instructions  - continue ASA and plavix for another 2 months and then plavix after - check BP at home for 2 weeks and record and bring over to PCP - continue follow up with the injectable medication for high cholesterol - healthy diet and regular exercise - Follow up with your primary care physician for stroke risk factor modification. Recommend maintain blood pressure goal <130/80, diabetes with hemoglobin A1c goal below 6.5% and lipids with LDL cholesterol goal below 70 mg/dL.  - follow up with Dr. Pearlean Brownie on 09/26/15   Marvel Plan, MD PhD [facility] [address], [address] [place], [place] [address] [phone]

## 2015-07-13 DIAGNOSIS — M545 Low back pain: Secondary | ICD-10-CM | POA: Diagnosis not present

## 2015-07-16 ENCOUNTER — Telehealth: Payer: Self-pay | Admitting: Pharmacist

## 2015-07-16 DIAGNOSIS — E785 Hyperlipidemia, unspecified: Secondary | ICD-10-CM

## 2015-07-16 NOTE — Telephone Encounter (Signed)
Called patient re: insurance rejection of Praluent. Per [facility], patient needs recent lipid panel within the last 30 days. Scheduled patient for lipid panel on Friday, 07/20/15. Will follow up with Praluent paperwork afterwards.

## 2015-07-20 ENCOUNTER — Other Ambulatory Visit (INDEPENDENT_AMBULATORY_CARE_PROVIDER_SITE_OTHER): Payer: Medicare Other | Admitting: *Deleted

## 2015-07-20 DIAGNOSIS — M5416 Radiculopathy, lumbar region: Secondary | ICD-10-CM | POA: Diagnosis not present

## 2015-07-20 DIAGNOSIS — E785 Hyperlipidemia, unspecified: Secondary | ICD-10-CM | POA: Diagnosis not present

## 2015-07-20 LAB — HEPATIC FUNCTION PANEL
ALBUMIN: 4.1 g/dL (ref 3.5–5.2)
ALK PHOS: 50 U/L (ref 39–117)
ALT: 18 U/L (ref 0–35)
AST: 23 U/L (ref 0–37)
Bilirubin, Direct: 0.3 mg/dL (ref 0.0–0.3)
TOTAL PROTEIN: 7.1 g/dL (ref 6.0–8.3)
Total Bilirubin: 1.6 mg/dL — ABNORMAL HIGH (ref 0.2–1.2)

## 2015-07-20 LAB — LIPID PANEL
Cholesterol: 209 mg/dL — ABNORMAL HIGH (ref 0–200)
HDL: 51.5 mg/dL (ref >39.00)
LDL CALC: 137 mg/dL — AB (ref 0–99)
NONHDL: 157.61
Total CHOL/HDL Ratio: 4
Triglycerides: 103 mg/dL (ref 0.0–149.0)
VLDL: 20.6 mg/dL (ref 0.0–40.0)

## 2015-07-20 NOTE — Addendum Note (Signed)
Addended by: Tonita Phoenix on: 07/20/2015 09:19 AM   Modules accepted: Orders

## 2015-07-21 NOTE — Progress Notes (Signed)
lipids reviewed - working on [employer]

## 2015-08-02 ENCOUNTER — Telehealth: Payer: Self-pay | Admitting: Pharmacist

## 2015-08-02 MED ORDER — ALIROCUMAB 75 MG/ML ~~LOC~~ SOPN: 75.0000 mg | PEN_INJECTOR | SUBCUTANEOUS | Status: DC

## 2015-08-02 NOTE — Telephone Encounter (Signed)
Received letter from [facility] that pt is approved for Praluent from 07/05/15-01/24/16.  Spoke with pt.  Will send rx to pharmacy and she will call when she receives it to instruct her on injection technique

## 2015-08-10 ENCOUNTER — Telehealth: Payer: Self-pay | Admitting: *Deleted

## 2015-08-10 MED ORDER — CLOPIDOGREL BISULFATE 75 MG PO TABS: 75.0000 mg | ORAL_TABLET | Freq: Every day | ORAL | Status: DC

## 2015-08-10 NOTE — Telephone Encounter (Signed)
[facility] requests a refill on clopidogrel  for the patient. The fax has Crista Curb as the prescriber. Ok to refill? Please advise. Thanks, MI

## 2015-08-10 NOTE — Telephone Encounter (Signed)
Prescription sent to the pharmacy.

## 2015-08-16 ENCOUNTER — Telehealth: Payer: Self-pay | Admitting: Pharmacist

## 2015-08-16 DIAGNOSIS — E785 Hyperlipidemia, unspecified: Secondary | ICD-10-CM

## 2015-08-16 NOTE — Telephone Encounter (Signed)
Patient received first shipment of Praluent this morning. Came to clinic for first injection - self administered successfully in R outer thigh on 08/16/15. Patient was given Praluent samples as well, and will begin injections every 14 days. Follow up lipid panel scheduled in 3 months.

## 2015-08-28 ENCOUNTER — Encounter: Payer: Self-pay | Admitting: Family Medicine

## 2015-08-28 ENCOUNTER — Ambulatory Visit (INDEPENDENT_AMBULATORY_CARE_PROVIDER_SITE_OTHER): Payer: Medicare Other | Admitting: Family Medicine

## 2015-08-28 VITALS — BP 142/60 | HR 54 | Temp 98.1°F | Wt 149.0 lb

## 2015-08-28 DIAGNOSIS — I10 Essential (primary) hypertension: Secondary | ICD-10-CM

## 2015-08-28 DIAGNOSIS — G451 Carotid artery syndrome (hemispheric): Secondary | ICD-10-CM

## 2015-08-28 DIAGNOSIS — R739 Hyperglycemia, unspecified: Secondary | ICD-10-CM | POA: Insufficient documentation

## 2015-08-28 DIAGNOSIS — E785 Hyperlipidemia, unspecified: Secondary | ICD-10-CM

## 2015-08-28 MED ORDER — AMLODIPINE BESYLATE 5 MG PO TABS: ORAL_TABLET | ORAL | Status: DC

## 2015-08-28 MED ORDER — CLOPIDOGREL BISULFATE 75 MG PO TABS: 75.0000 mg | ORAL_TABLET | Freq: Every day | ORAL | Status: DC

## 2015-08-28 NOTE — Assessment & Plan Note (Signed)
S: poorly controlled. On atenolol  BID and amlodipine 2.5mg . Home readings in 140s BP Readings from Last 3 Encounters:  08/28/15 142/60  07/02/15 165/60  06/05/15 140/62  A/P:Continue current meds but titrate amlodipine to . Phone call 2 weeks and may go to . Follow up in office 1 month.

## 2015-08-28 NOTE — Assessment & Plan Note (Signed)
S: no recurrence. Compliant with plavix- did 3 months on asa/plavix combo. Needs tighter BP control. Lipids hopefully improving on injectable A/P: no change in therapy today- remove aspirin from list and continue plavix alone. Working on improved BP.

## 2015-08-28 NOTE — Patient Instructions (Signed)
Blood pressure goal <140/90.  Increase amlodipine to  (sent this to [facility] but until you pick it up you can take 2 of your 2.5mg  pills) Call me in 2 weeks and give me an update on your blood pressure.  May go up to  amlodipine.  Let's plan on seeing each other in 1 month as we really want to make sure this blood pressure is controlled with your stroke history

## 2015-08-28 NOTE — Progress Notes (Signed)
Tana Conch, MD  Subjective:  Joyce [name] is a 81 y.o. year old very pleasant female patient who presents with:  See problem oriented charting ROS- no chest pain, shortness of breath, extremity or facial weakness. No headache or blurry or doubel visoin.   Past Medical History-  Patient Active Problem List   Diagnosis Date Noted  . History of stroke     Priority: High  . TIA (transient ischemic attack) 05/16/2015    Priority: High  . CAD (coronary artery disease) 04/05/2008    Priority: High  . Hyperglycemia 08/28/2015    Priority: Medium  . Overactive bladder 08/14/2014    Priority: Medium  . Lumbar pain with radiation down left leg 02/21/2011    Priority: Medium  . Essential hypertension 01/17/2010    Priority: Medium  . Hyperlipidemia 01/15/2009    Priority: Medium  . ADJUSTMENT DISORDER WITH ANXIOUS MOOD 01/15/2009    Priority: Medium  . Hypothyroidism 04/05/2008    Priority: Medium  . IRRITABLE BOWEL SYNDROME, HX OF 04/05/2008    Priority: Medium  . CERUMEN IMPACTION, BILATERAL 11/27/2010    Priority: Low  . GERD 04/05/2008    Priority: Low  . INTERNAL HEMORRHOIDS 06/29/2002    Priority: Low  . Intracranial vascular stenosis 07/02/2015   Medications- reviewed and updated Current Outpatient Prescriptions  Medication Sig Dispense Refill  . Alirocumab (PRALUENT) 75 MG/ML SOPN Inject 75 mg into the skin every 14 (fourteen) days. 2 pen 3  . amLODipine (NORVASC) 2.5 MG tablet TAKE ONE TABLET BY MOUTH ONCE DAILY 90 tablet 3  . aspirin EC 81 MG EC tablet Take 1 tablet (81 mg total) by mouth daily.    Marland Kitchen atenolol (TENORMIN) 25 MG tablet TAKE ONE TABLET BY MOUTH TWICE DAILY 180 tablet 3  . Biotin 1000 MCG tablet Take 1,000 mcg by mouth daily.    . clopidogrel (PLAVIX) 75 MG tablet Take 1 tablet (75 mg total) by mouth daily. 30 tablet 3  . CRANBERRY CONCENTRATE PO Take 1 tablet by mouth daily.    Marland Kitchen gabapentin (NEURONTIN) 300 MG capsule Take 300 mg by mouth 3 (three)  times daily.    . Lactobacillus (ACIDOPHILUS) 10 MG CAPS Take 1 capsule by mouth daily.     Marland Kitchen levothyroxine (SYNTHROID, LEVOTHROID) 100 MCG tablet Take 1 tablet (100 mcg total) by mouth daily. 180 tablet 3  . Omega-3 Fatty Acids (FISH OIL) 1000 MG CAPS Take 1 capsule by mouth daily.    Marland Kitchen oxybutynin (DITROPAN) 5 MG tablet Take 5 mg by mouth 2 (two) times daily.     . Probiotic Product (ALIGN PO) Take 1 capsule by mouth daily.    Marland Kitchen pyridOXINE (B-6) 50 MG tablet Take 50 mg by mouth daily.    . ranitidine (ZANTAC) 150 MG tablet TAKE ONE TABLET BY MOUTH TWICE DAILY AS NEEDED FOR HEARTBURN (Patient taking differently: Take 150 mg by mouth 2 (two) times daily. TAKE ONE TABLET BY MOUTH TWICE DAILY AS NEEDED FOR HEARTBURN) 180 tablet 3  . vitamin B-12 (CYANOCOBALAMIN) 500 MCG tablet Take 500 mcg by mouth daily.     . vitamin E 400 UNIT capsule Take 400 Units by mouth daily.    Marland Kitchen LORazepam (ATIVAN) 0.5 MG tablet Take 0.5 tablets (0.25 mg total) by mouth at bedtime as needed for anxiety. (Patient not taking: Reported on 06/05/2015) 45 tablet 1  . nitroGLYCERIN (NITROSTAT) 0.4 MG SL tablet Place 1 tablet (0.4 mg total) under the tongue every 5 (five) minutes  as needed for chest pain. (Patient not taking: Reported on 06/05/2015) 25 tablet 3   Objective: BP 142/60 mmHg  Pulse 54  Temp(Src) 98.1 F (36.7 C)  Wt 149 lb (67.586 kg)  LMP  (LMP Unknown) Gen: NAD, resting comfortably CV: RRR no murmurs rubs or gallops Lungs: CTAB no crackles, wheeze, rhonchi Abdomen: soft/nontender/nondistended/normal bowel sounds. No rebound or guarding.  Ext: trace edema Skin: warm, dry Neuro: grossly normal, moves all extremities, normal gait   Assessment/Plan:  Essential hypertension S: poorly controlled. On atenolol 25mg  BID and amlodipine 2.5mg . Home readings in 140s BP Readings from Last 3 Encounters:  08/28/15 142/60  07/02/15 165/60  06/05/15 140/62  A/P:Continue current meds but titrate amlodipine to 5mg .  Phone call 2 weeks and may go to 10mg . Follow up in office 1 month.    Hyperlipidemia S: very important to control with stroke/TIA history. Intolerant of statins. Trialing praluent. Has taken 1st shot, considering given 2nd to herself at home.  A/P:we held off on repeating lipids as has only had 1 dose of medicine, consider 3 months out from injection 1st.    TIA (transient ischemic attack) S: no recurrence. Compliant with plavix- did 3 months on asa/plavix combo. Needs tighter BP control. Lipids hopefully improving on injectable A/P: no change in therapy today- remove aspirin from list and continue plavix alone. Working on improved BP.    1 month in office BP reevaluation  Meds ordered this encounter  Medications  . gabapentin (NEURONTIN) 300 MG capsule    Sig: Take 300 mg by mouth 3 (three) times daily.  . clopidogrel (PLAVIX) 75 MG tablet    Sig: Take 1 tablet (75 mg total) by mouth daily.    Dispense:  90 tablet    Refill:  3  . amLODipine (NORVASC) 5 MG tablet    Sig: TAKE ONE TABLET BY MOUTH ONCE DAILY    Dispense:  30 tablet    Refill:  1

## 2015-08-28 NOTE — Assessment & Plan Note (Signed)
S: very important to control with stroke/TIA history. Intolerant of statins. Trialing praluent. Has taken 1st shot, considering given 2nd to herself at home.  A/P:we held off on repeating lipids as has only had 1 dose of medicine, consider 3 months out from injection 1st.

## 2015-09-10 ENCOUNTER — Ambulatory Visit: Payer: Medicare Other | Admitting: Family Medicine

## 2015-09-17 ENCOUNTER — Telehealth: Payer: Self-pay | Admitting: Family Medicine

## 2015-09-17 NOTE — Telephone Encounter (Signed)
Pt is calling to report bp readings 08-29-15 131/56, 08-30-15 130/66, 09-01-15 134-62, 09-02-15 133/65. 09-03-15 133/62, 09-04-15 133/64, 09-05-15 137/63. 09-06-15 127/63, 09-07-15 128/57, 09-08-15 123/64, 09-09-15 126/62, 09-10-15 122/62 and 09-11-15 130/61 and last date 09-12-15 118/58. Pt took bp around 830 am and pulse around 50

## 2015-09-18 NOTE — Telephone Encounter (Signed)
Overall this looks pretty good.  Continue On atenolol  BID and amlodipine  We will need to monitor her pulse at follow up within a month Its ok as long as no dizziness, ligthheadedness

## 2015-09-18 NOTE — Telephone Encounter (Signed)
Pt.notified

## 2015-09-18 NOTE — Telephone Encounter (Signed)
See below

## 2015-09-25 ENCOUNTER — Ambulatory Visit: Payer: Medicare Other | Admitting: Family Medicine

## 2015-09-26 ENCOUNTER — Encounter (INDEPENDENT_AMBULATORY_CARE_PROVIDER_SITE_OTHER): Payer: Self-pay

## 2015-09-26 DIAGNOSIS — Z0289 Encounter for other administrative examinations: Secondary | ICD-10-CM

## 2015-09-27 LAB — HEPATIC FUNCTION PANEL
ALT: 18 U/L (ref 7–35)
AST: 23 U/L (ref 13–35)
Alkaline Phosphatase: 62 U/L (ref 25–125)
BILIRUBIN, TOTAL: 1.2 mg/dL

## 2015-09-27 LAB — CBC AND DIFFERENTIAL
HEMATOCRIT: 41 % (ref 36–46)
HEMOGLOBIN: 13.6 g/dL (ref 12.0–16.0)
PLATELETS: 211 10*3/uL (ref 150–399)
WBC: 5.2 10*3/mL

## 2015-09-27 LAB — BASIC METABOLIC PANEL
BUN: 14 mg/dL (ref 4–21)
Creatinine: 0.6 mg/dL (ref 0.5–1.1)
Potassium: 4.9 mmol/L (ref 3.4–5.3)
Sodium: 143 mmol/L (ref 137–147)

## 2015-10-01 ENCOUNTER — Encounter: Payer: Self-pay | Admitting: Family Medicine

## 2015-10-02 ENCOUNTER — Ambulatory Visit (INDEPENDENT_AMBULATORY_CARE_PROVIDER_SITE_OTHER): Payer: Medicare Other | Admitting: Family Medicine

## 2015-10-02 ENCOUNTER — Encounter: Payer: Self-pay | Admitting: Family Medicine

## 2015-10-02 VITALS — BP 138/64 | HR 60 | Temp 98.5°F | Wt 149.0 lb

## 2015-10-02 DIAGNOSIS — Z23 Encounter for immunization: Secondary | ICD-10-CM | POA: Diagnosis not present

## 2015-10-02 DIAGNOSIS — I10 Essential (primary) hypertension: Secondary | ICD-10-CM

## 2015-10-02 DIAGNOSIS — G451 Carotid artery syndrome (hemispheric): Secondary | ICD-10-CM

## 2015-10-02 NOTE — Assessment & Plan Note (Signed)
S: improved control on amlodipine  now in addition to prior atenolol. home- all #s below 140/60, usually 120s or 130s BP Readings from Last 3 Encounters:  10/02/15 138/64  08/28/15 142/60  07/02/15 165/60  A/P: continue current rx. . Controlled in office, even better control at home

## 2015-10-02 NOTE — Assessment & Plan Note (Signed)
S: compliant with plavix. Compliant with injections for psk9 inhibitor. BP now at goal A/P: continue plavix and BP meds- now at goal. Has upcoming lipid panel- hopefully improved since statin intolerant.

## 2015-10-02 NOTE — Progress Notes (Signed)
Tana Conch, MD  Subjective:  Joyce [name] is a 81 y.o. year old very pleasant female patient who presents for/with See problem oriented charting ROS- no chest pain or shortness of breath or dyspnea on exertion or headache or blurry vision or lightheadedness  Past Medical History-  Patient Active Problem List   Diagnosis Date Noted  . History of stroke     Priority: High  . TIA (transient ischemic attack) 05/16/2015    Priority: High  . CAD (coronary artery disease) 04/05/2008    Priority: High  . Hyperglycemia 08/28/2015    Priority: Medium  . Overactive bladder 08/14/2014    Priority: Medium  . Lumbar pain with radiation down left leg 02/21/2011    Priority: Medium  . Essential hypertension 01/17/2010    Priority: Medium  . Hyperlipidemia 01/15/2009    Priority: Medium  . ADJUSTMENT DISORDER WITH ANXIOUS MOOD 01/15/2009    Priority: Medium  . Hypothyroidism 04/05/2008    Priority: Medium  . IRRITABLE BOWEL SYNDROME, HX OF 04/05/2008    Priority: Medium  . CERUMEN IMPACTION, BILATERAL 11/27/2010    Priority: Low  . GERD 04/05/2008    Priority: Low  . INTERNAL HEMORRHOIDS 06/29/2002    Priority: Low  . Intracranial vascular stenosis 07/02/2015    Medications- reviewed and updated Current Outpatient Prescriptions  Medication Sig Dispense Refill  . Alirocumab (PRALUENT) 75 MG/ML SOPN Inject 75 mg into the skin every 14 (fourteen) days. 2 pen 3  . amLODipine (NORVASC) 5 MG tablet TAKE ONE TABLET BY MOUTH ONCE DAILY 30 tablet 1  . atenolol (TENORMIN) 25 MG tablet TAKE ONE TABLET BY MOUTH TWICE DAILY 180 tablet 3  . Biotin 1000 MCG tablet Take 1,000 mcg by mouth daily.    . clopidogrel (PLAVIX) 75 MG tablet Take 1 tablet (75 mg total) by mouth daily. 90 tablet 3  . CRANBERRY CONCENTRATE PO Take 1 tablet by mouth daily.    . Lactobacillus (ACIDOPHILUS) 10 MG CAPS Take 1 capsule by mouth daily.     Marland Kitchen levothyroxine (SYNTHROID, LEVOTHROID) 100 MCG tablet Take 1 tablet  (100 mcg total) by mouth daily. 180 tablet 3  . LORazepam (ATIVAN) 0.5 MG tablet Take 0.5 tablets (0.25 mg total) by mouth at bedtime as needed for anxiety. (Patient not taking: Reported on 06/05/2015) 45 tablet 1  . nitroGLYCERIN (NITROSTAT) 0.4 MG SL tablet Place 1 tablet (0.4 mg total) under the tongue every 5 (five) minutes as needed for chest pain. (Patient not taking: Reported on 06/05/2015) 25 tablet 3  . Omega-3 Fatty Acids (FISH OIL) 1000 MG CAPS Take 1 capsule by mouth daily.    Marland Kitchen oxybutynin (DITROPAN) 5 MG tablet Take 5 mg by mouth 2 (two) times daily.     . Probiotic Product (ALIGN PO) Take 1 capsule by mouth daily.    Marland Kitchen pyridOXINE (B-6) 50 MG tablet Take 50 mg by mouth daily.    . ranitidine (ZANTAC) 150 MG tablet TAKE ONE TABLET BY MOUTH TWICE DAILY AS NEEDED FOR HEARTBURN (Patient taking differently: Take 150 mg by mouth 2 (two) times daily. TAKE ONE TABLET BY MOUTH TWICE DAILY AS NEEDED FOR HEARTBURN) 180 tablet 3  . vitamin B-12 (CYANOCOBALAMIN) 500 MCG tablet Take 500 mcg by mouth daily.     . vitamin E 400 UNIT capsule Take 400 Units by mouth daily.     No current facility-administered medications for this visit.    Objective: BP 138/64 mmHg  Pulse 60  Temp(Src) 98.5 F (  36.9 C)  Wt 149 lb (67.586 kg)  LMP  (LMP Unknown) Gen: NAD, resting comfortably CV: RRR no murmurs rubs or gallops Lungs: CTAB no crackles, wheeze, rhonchi Abdomen: soft/nontender/nondistended/normal bowel sounds. No rebound or guarding.  Ext: trace edema under stockings Skin: warm, dry Neuro: grossly normal, moves all extremities, normal gait  Assessment/Plan:  Essential hypertension S: improved control on amlodipine  now in addition to prior atenolol. home- all #s below 140/60, usually 120s or 130s BP Readings from Last 3 Encounters:  10/02/15 138/64  08/28/15 142/60  07/02/15 165/60  A/P: continue current rx. . Controlled in office, even better control at home  TIA (transient ischemic  attack) S: compliant with plavix. Compliant with injections for psk9 inhibitor. BP now at goal A/P: continue plavix and BP meds- now at goal. Has upcoming lipid panel- hopefully improved since statin intolerant.   3-4 month f/u  Orders Placed This Encounter  Procedures  . Flu Vaccine QUAD 36+ mos IM

## 2015-10-02 NOTE — Patient Instructions (Addendum)
Flu shot received today.  Blood pressure looks muh better. Let's continue current medicines  When you get your labs- get lipid panel only. You had hepatic function test with your research- do not have them repeat that.   3 months follow up- sometime mid to late January

## 2015-10-12 ENCOUNTER — Other Ambulatory Visit: Payer: Self-pay | Admitting: Pharmacist

## 2015-10-12 MED ORDER — ALIROCUMAB 75 MG/ML ~~LOC~~ SOPN: 75.0000 mg | PEN_INJECTOR | SUBCUTANEOUS | Status: DC

## 2015-10-16 ENCOUNTER — Other Ambulatory Visit: Payer: Self-pay | Admitting: Pharmacist

## 2015-10-16 MED ORDER — ALIROCUMAB 75 MG/ML ~~LOC~~ SOPN: 75.0000 mg | PEN_INJECTOR | SUBCUTANEOUS | Status: DC

## 2015-11-14 ENCOUNTER — Other Ambulatory Visit (INDEPENDENT_AMBULATORY_CARE_PROVIDER_SITE_OTHER): Payer: Medicare Other | Admitting: *Deleted

## 2015-11-14 DIAGNOSIS — E785 Hyperlipidemia, unspecified: Secondary | ICD-10-CM

## 2015-11-14 LAB — LIPID PANEL
CHOL/HDL RATIO: 2 ratio (ref <=5.0)
Cholesterol: 112 mg/dL — ABNORMAL LOW (ref 125–200)
HDL: 56 mg/dL (ref >=46)
LDL Cholesterol: 43 mg/dL (ref <130)
Triglycerides: 64 mg/dL (ref <150)
VLDL: 13 mg/dL (ref <30)

## 2015-11-14 LAB — HEPATIC FUNCTION PANEL
ALBUMIN: 4 g/dL (ref 3.6–5.1)
ALT: 17 U/L (ref 6–29)
AST: 25 U/L (ref 10–35)
Alkaline Phosphatase: 49 U/L (ref 33–130)
BILIRUBIN INDIRECT: 1 mg/dL (ref 0.2–1.2)
Bilirubin, Direct: 0.3 mg/dL — ABNORMAL HIGH (ref <=0.2)
TOTAL PROTEIN: 6.7 g/dL (ref 6.1–8.1)
Total Bilirubin: 1.3 mg/dL — ABNORMAL HIGH (ref 0.2–1.2)

## 2015-11-14 NOTE — Addendum Note (Signed)
Addended by: Tonita PhoenixBOWDEN, Terril Chestnut K on: 11/14/2015 08:00 AM   Modules accepted: Orders

## 2015-11-14 NOTE — Addendum Note (Signed)
Addended by: Della Homan K on: 11/14/2015 07:38 AM   Modules accepted: Orders  

## 2015-11-14 NOTE — Addendum Note (Signed)
Addended by: Tonita PhoenixBOWDEN, Saphyre Cillo K on: 11/14/2015 07:38 AM   Modules accepted: Orders

## 2015-11-19 ENCOUNTER — Ambulatory Visit: Payer: Medicare Other | Admitting: Physician Assistant

## 2015-12-03 ENCOUNTER — Encounter: Payer: Self-pay | Admitting: Cardiovascular Disease

## 2015-12-03 ENCOUNTER — Ambulatory Visit: Payer: Medicare Other | Admitting: Cardiovascular Disease

## 2015-12-03 ENCOUNTER — Ambulatory Visit (INDEPENDENT_AMBULATORY_CARE_PROVIDER_SITE_OTHER): Payer: Medicare Other | Admitting: Cardiovascular Disease

## 2015-12-03 VITALS — BP 142/62 | HR 53 | Ht 59.0 in | Wt 145.8 lb

## 2015-12-03 DIAGNOSIS — I10 Essential (primary) hypertension: Secondary | ICD-10-CM | POA: Diagnosis not present

## 2015-12-03 DIAGNOSIS — I25118 Atherosclerotic heart disease of native coronary artery with other forms of angina pectoris: Secondary | ICD-10-CM | POA: Diagnosis not present

## 2015-12-03 DIAGNOSIS — E785 Hyperlipidemia, unspecified: Secondary | ICD-10-CM

## 2015-12-03 DIAGNOSIS — I2510 Atherosclerotic heart disease of native coronary artery without angina pectoris: Secondary | ICD-10-CM

## 2015-12-03 MED ORDER — ATENOLOL 25 MG PO TABS: ORAL_TABLET | ORAL | Status: DC

## 2015-12-03 MED ORDER — CLOPIDOGREL BISULFATE 75 MG PO TABS: 75.0000 mg | ORAL_TABLET | Freq: Every day | ORAL | Status: DC

## 2015-12-03 MED ORDER — AMLODIPINE BESYLATE 5 MG PO TABS: ORAL_TABLET | ORAL | Status: DC

## 2015-12-03 NOTE — Progress Notes (Signed)
Cardiology Office Note Date:  12/03/2015   ID:  Joyce [name], DOB 06-14-1931, MRN [id number]  PCP:  Tana ConchStephen Hunter, MD  Cardiologist:  Tonny Bollmanooper, Bruna Dills, MD    Chief Complaint  Patient presents with  . Annual Exam     History of Present Illness: Joyce [name] is a 81 y.o. female who presents for follow-up of CAD, hypertension, and stroke. She underwent remote PCI of the left circumflex with stenting back in 2002. She was hospitalized in December 2014 with sudden onset of right leg weakness. She was diagnosed with an ischemic infarct.  Complains of a tightness in her jaw that occurs with anxiety. Nor related to exertion. Occasional chest pressure and shortness of breath, also feels this is stress-related.  She's had a lot of trouble with anxiety since her husband's death. She has been able to stay active and she is involved at her church and also in a lot of social activities. She feels like she's over-scheduled sometimes.  She denies any change in her symptoms. She denies orthopnea, PND, or leg swelling.  Past Medical History  Diagnosis Date  . Unspecified essential hypertension   . Hyperlipidemia   . GERD (gastroesophageal reflux disease)   . Candidiasis of skin and nails   . Internal hemorrhoids without mention of complication   . Hypothyroidism   . IBS (irritable bowel syndrome)   . Coronary atherosclerosis of unspecified type of vessel, native or graft [facility]    DENIES S & S  . Borderline glaucoma   . Adjustment disorder with anxiety STRESS CHEST DISCOMFORT    PT RECENTLY PUT HUSBAND IN NURSING HOME  . History of sleep apnea YRS AGO ON CPAP UNTIL LOST WT  . Pinched nerve LUMBAR    RESIDUAL LEFT FOOT NUMBNESS/ TINGLING  . Scoliosis   . Numbness and tingling of foot LEFT -- SECONDARY TO PINCHED LUMBAR NERVE  . Nocturia   . Stress incontinence   . Lesion of bladder   . Sinusitis 09/29/2013    Past Surgical History  Procedure Laterality Date  .  Vaginal hysterectomy  1968  . Cholecystectomy  1976  . Cataract extraction w/ intraocular lens  implant, bilateral    . Cardiac catheterization  01-10-2003   DR Chrissie NoaWILLIAM DOWNEY    PATENT CIRCUMFLEX STENT/ MODERATE CAD ELSEWHERE  . Coronary angioplasty with stent placement  08-31-2001   DR BRUCE BRODIE    STENTING OF PROXIMAL CIRCUMFLEX (75% TO 0%)   . Cardiovascular stress test  07-27-2012    NORMAL NUCLEAR STUDY/ LVEF 81%  . Cystoscopy with biopsy  08/02/2012    Procedure: CYSTOSCOPY WITH BIOPSY;  Surgeon: Milford Cageaniel Young Woodruff, MD;  [facility];  Service: Urology;  Laterality: N/A;    Current Outpatient Prescriptions  Medication Sig Dispense Refill  . Alirocumab (PRALUENT) 75 MG/ML SOPN Inject 75 mg into the skin every 14 (fourteen) days. 2 pen 6  . amLODipine (NORVASC) 5 MG tablet TAKE ONE TABLET BY MOUTH ONCE DAILY 90 tablet 3  . atenolol (TENORMIN) 25 MG tablet TAKE ONE TABLET BY MOUTH TWICE DAILY 180 tablet 3  . Biotin 1000 MCG tablet Take 1,000 mcg by mouth daily.    . clopidogrel (PLAVIX) 75 MG tablet Take 1 tablet (75 mg total) by mouth daily. 90 tablet 3  . CRANBERRY CONCENTRATE PO Take 1 tablet by mouth daily.    . Lactobacillus (ACIDOPHILUS) 10 MG CAPS Take 1 capsule by mouth daily.     .Marland Kitchen  levothyroxine (SYNTHROID, LEVOTHROID) 100 MCG tablet Take 1 tablet (100 mcg total) by mouth daily. 180 tablet 3  . LORazepam (ATIVAN) 0.5 MG tablet Take 0.5 tablets (0.25 mg total) by mouth at bedtime as needed for anxiety. 45 tablet 1  . nitroGLYCERIN (NITROSTAT) 0.4 MG SL tablet Place 0.4 mg under the tongue every 5 (five) minutes as needed for chest pain. Up to 3 doses    . Omega-3 Fatty Acids (FISH OIL) 1000 MG CAPS Take 1 capsule by mouth daily.    Marland Kitchen oxybutynin (DITROPAN) 5 MG tablet Take 5 mg by mouth 2 (two) times daily.     . Probiotic Product (ALIGN PO) Take 1 capsule by mouth daily.    Marland Kitchen pyridOXINE (B-6) 50 MG tablet Take 50 mg by mouth daily.    . ranitidine  (ZANTAC) 150 MG capsule Take 150 mg by mouth 2 (two) times daily.    . vitamin B-12 (CYANOCOBALAMIN) 500 MCG tablet Take 500 mcg by mouth daily.     . vitamin E 400 UNIT capsule Take 400 Units by mouth daily.     No current facility-administered medications for this visit.    Allergies:   Loratadine; Adhesive; Aspirin; Levaquin; Levofloxacin; Lumigan; Mobic; Penicillins; and Statins   Social History:  The patient  reports that she quit smoking about 32 years ago. Her smoking use included Cigarettes. She has a 30 pack-year smoking history. She has never used smokeless tobacco. She reports that she does not drink alcohol or use illicit drugs.   Family History:  The patient's  family history includes CAD in her father; Heart disease in her mother.    ROS:  Please see the history of present illness.  Otherwise, review of systems is positive for rash.  All other systems are reviewed and negative.    PHYSICAL EXAM: VS:  BP 142/62 mmHg  Pulse 53  Ht  (1.499 m)  Wt 145 lb 12.8 oz (66.134 kg)  BMI 29.43 kg/m2  LMP  (LMP Unknown) , BMI Body mass index is 29.43 kg/(m^2). GEN: Well nourished, well developed, pleasant elderly woman in no acute distress HEENT: normal Neck: no JVD, no masses. No carotid bruits Cardiac: RRR without murmur or gallop                Respiratory:  clear to auscultation bilaterally, normal work of breathing GI: soft, nontender, nondistended, + BS MS: no deformity or atrophy Ext: no pretibial edema, pedal pulses 2+= bilaterally Skin: warm and dry, no rash Neuro:  Strength and sensation are intact Psych: euthymic mood, full affect  EKG:  EKG is ordered today. The ekg ordered today shows  Sinus bradycardia 53 bpm, left axis deviation , T wave abnormality consider inferolateral ischemia , septal infarct age undetermined.  Recent Labs: 03/27/2015: TSH 0.62 09/27/2015: BUN 14; Creatinine 0.6; Hemoglobin 13.6; Platelets 211; Potassium 4.9; Sodium 143 11/14/2015: ALT  17   Lipid Panel     Component Value Date/Time   CHOL 112* 11/14/2015 0801   TRIG 64 11/14/2015 0801   HDL 56 11/14/2015 0801   CHOLHDL 2.0 11/14/2015 0801   VLDL 13 11/14/2015 0801   LDLCALC 43 11/14/2015 0801   LDLDIRECT 161.4 06/19/2011 0939      Wt Readings from Last 3 Encounters:  12/03/15 145 lb 12.8 oz (66.134 kg)  10/02/15 149 lb (67.586 kg)  08/28/15 149 lb (67.586 kg)     Cardiac Studies Reviewed: 2D Echo 05/17/2015: Study Conclusions  - Left ventricle: The cavity  size was normal. Wall thickness was normal. The estimated ejection fraction was 60%. Wall motion was normal; there were no regional wall motion abnormalities. - Right ventricle: The cavity size was normal. Systolic function was normal. - Impressions: No cardiac source of embolism was identified, but cannot be ruled out on the basis of this examination.  Impressions:  - No cardiac source of embolism was identified, but cannot be ruled out on the basis of this examination.  Nuclear Stress Test 8.31.2016: Impression Exercise Capacity: Lexiscan with no exercise. BP Response: Normal blood pressure response. Clinical Symptoms: Mild chest pain/dyspnea. ECG Impression: Mild 0.5 mm ST depressions in the inferolateral leads. Comparison with Prior Nuclear Study: No images to compare  Overall Impression: Low risk stress nuclear study with a small area mild severity ischemia in the distal LAD (SDS 2). .  LV Ejection Fraction: 71%. LV Wall Motion: NL LV Function; NL Wall Motion  ASSESSMENT AND PLAN: 1. Coronary artery disease, native vessel.   Symptoms are unchanged and she underwent a stress Myoview scan last year with similar complaints. We'll continue current medical program as her stress study was low risk last year.  2. TIA.   Continues with Plavix monotherapy. Neurology notes reviewed.  3. Hypertension.  Blood pressure is well controlled on her current medical program.  4.  Hyperlipidemia. The patient is statin intolerant. She's treated with a PCSK9-inhibitor.  Had marked improvement in her lipids since treatment was started. Most recent labs reviewed as above.  Current medicines are reviewed with the patient today.  The patient does not have concerns regarding medicines.  Labs/ tests ordered today include:   Orders Placed This Encounter  Procedures  . EKG 12-Lead    Disposition:   FU one year  Signed, Tonny Bollman, MD  12/03/2015 2:01 PM    [facility] [address] [place], [place], [place]  [address] Phone: [phone]; Fax: [phone]

## 2015-12-03 NOTE — Patient Instructions (Signed)

## 2015-12-06 ENCOUNTER — Other Ambulatory Visit: Payer: Self-pay | Admitting: Cardiovascular Disease

## 2015-12-06 ENCOUNTER — Other Ambulatory Visit: Payer: Self-pay | Admitting: Family Medicine

## 2015-12-11 ENCOUNTER — Other Ambulatory Visit: Payer: Self-pay

## 2015-12-11 ENCOUNTER — Telehealth: Payer: Self-pay | Admitting: Family Medicine

## 2015-12-11 MED ORDER — LORAZEPAM 0.5 MG PO TABS: 0.2500 mg | ORAL_TABLET | Freq: Every evening | ORAL | Status: DC | PRN

## 2015-12-11 NOTE — Telephone Encounter (Signed)
Medication called in 

## 2015-12-11 NOTE — Telephone Encounter (Signed)
Pt requesting LORAZEPAM  Pt last visit 10/02/15 Pt last refill 01/16/15 #45 with one refill   Please advise

## 2015-12-11 NOTE — Telephone Encounter (Signed)
Yes thanks may fill 

## 2016-01-03 ENCOUNTER — Telehealth: Payer: Self-pay | Admitting: Pharmacist

## 2016-01-03 DIAGNOSIS — L232 Allergic contact dermatitis due to cosmetics: Secondary | ICD-10-CM | POA: Diagnosis not present

## 2016-01-03 NOTE — Telephone Encounter (Signed)
Pt called to report that she was giving her Praluent injection in her leg today and the pen slipped and she lost most of the drug. She needs to pick up a sample pen to replace her lost dose. Pt reports that she does not drive so her daughter will stop by on 1/13 to pick up a sample pen.

## 2016-01-29 ENCOUNTER — Ambulatory Visit: Payer: Medicare Other | Admitting: Family Medicine

## 2016-01-29 ENCOUNTER — Other Ambulatory Visit: Payer: Self-pay | Admitting: Family Medicine

## 2016-02-12 ENCOUNTER — Ambulatory Visit (INDEPENDENT_AMBULATORY_CARE_PROVIDER_SITE_OTHER): Payer: Medicare Other | Admitting: Family Medicine

## 2016-02-12 ENCOUNTER — Encounter: Payer: Self-pay | Admitting: Family Medicine

## 2016-02-12 VITALS — BP 122/60 | HR 54 | Temp 98.1°F | Wt 150.0 lb

## 2016-02-12 DIAGNOSIS — E034 Atrophy of thyroid (acquired): Secondary | ICD-10-CM

## 2016-02-12 DIAGNOSIS — E038 Other specified hypothyroidism: Secondary | ICD-10-CM

## 2016-02-12 DIAGNOSIS — I10 Essential (primary) hypertension: Secondary | ICD-10-CM

## 2016-02-12 DIAGNOSIS — I25118 Atherosclerotic heart disease of native coronary artery with other forms of angina pectoris: Secondary | ICD-10-CM | POA: Diagnosis not present

## 2016-02-12 DIAGNOSIS — E785 Hyperlipidemia, unspecified: Secondary | ICD-10-CM | POA: Diagnosis not present

## 2016-02-12 DIAGNOSIS — F4322 Adjustment disorder with anxiety: Secondary | ICD-10-CM

## 2016-02-12 MED ORDER — AMLODIPINE BESYLATE 5 MG PO TABS: ORAL_TABLET | ORAL | Status: DC

## 2016-02-12 MED ORDER — LORAZEPAM 0.5 MG PO TABS: 0.2500 mg | ORAL_TABLET | Freq: Every evening | ORAL | Status: DC | PRN

## 2016-02-12 MED ORDER — CLOPIDOGREL BISULFATE 75 MG PO TABS: 75.0000 mg | ORAL_TABLET | Freq: Every day | ORAL | Status: DC

## 2016-02-12 NOTE — Assessment & Plan Note (Signed)
S: remains asymptomatc. Compliant with synthroid . TSH has been stable Lab Results  Component Value Date   TSH 0.62 03/27/2015  A/P: repeat TSH at follow up in 6 months. Deferred so could be with more full set of bloodwork

## 2016-02-12 NOTE — Progress Notes (Signed)
Tana Conch, MD  Subjective:  Joyce [name] is a 82 y.o. year old very pleasant female patient who presents for/with See problem oriented charting ROS- No new chest pain (has some intermittent non exertional issues that cardiology is aware of). denies shortness of breath. No headache or blurry vision. No falls.   Past Medical History-  Patient Active Problem List   Diagnosis Date Noted  . History of stroke     Priority: High  . TIA (transient ischemic attack) 05/16/2015    Priority: High  . CAD (coronary artery disease) 04/05/2008    Priority: High  . Hyperglycemia 08/28/2015    Priority: Medium  . Overactive bladder 08/14/2014    Priority: Medium  . Lumbar pain with radiation down left leg 02/21/2011    Priority: Medium  . Essential hypertension 01/17/2010    Priority: Medium  . Hyperlipidemia 01/15/2009    Priority: Medium  . ADJUSTMENT DISORDER WITH ANXIOUS MOOD 01/15/2009    Priority: Medium  . Hypothyroidism 04/05/2008    Priority: Medium  . IRRITABLE BOWEL SYNDROME, HX OF 04/05/2008    Priority: Medium  . CERUMEN IMPACTION, BILATERAL 11/27/2010    Priority: Low  . GERD 04/05/2008    Priority: Low  . INTERNAL HEMORRHOIDS 06/29/2002    Priority: Low  . Intracranial vascular stenosis 07/02/2015    Medications- reviewed and updated Current Outpatient Prescriptions  Medication Sig Dispense Refill  . Alirocumab (PRALUENT) 75 MG/ML SOPN Inject 75 mg into the skin every 14 (fourteen) days. 2 pen 6  . amLODipine (NORVASC) 5 MG tablet TAKE ONE TABLET BY MOUTH ONCE DAILY 90 tablet 3  . atenolol (TENORMIN) 25 MG tablet TAKE ONE TABLET BY MOUTH TWICE DAILY 180 tablet 3  . Biotin 1000 MCG tablet Take 1,000 mcg by mouth daily.    . clopidogrel (PLAVIX) 75 MG tablet Take 1 tablet (75 mg total) by mouth daily. 90 tablet 3  . CRANBERRY CONCENTRATE PO Take 1 tablet by mouth daily.    . Lactobacillus (ACIDOPHILUS) 10 MG CAPS Take 1 capsule by mouth daily.     Marland Kitchen LORazepam  (ATIVAN) 0.5 MG tablet Take 0.5 tablets (0.25 mg total) by mouth at bedtime as needed for anxiety. 45 tablet 1  . nitroGLYCERIN (NITROSTAT) 0.4 MG SL tablet Place 0.4 mg under the tongue every 5 (five) minutes as needed for chest pain. Reported on 02/12/2016    . Omega-3 Fatty Acids (FISH OIL) 1000 MG CAPS Take 1 capsule by mouth daily.    Marland Kitchen oxybutynin (DITROPAN) 5 MG tablet Take 5 mg by mouth 2 (two) times daily.     . Probiotic Product (ALIGN PO) Take 1 capsule by mouth daily.    Marland Kitchen pyridOXINE (B-6) 50 MG tablet Take 50 mg by mouth daily.    . ranitidine (ZANTAC) 150 MG capsule Take 150 mg by mouth 2 (two) times daily.    Marland Kitchen SYNTHROID 100 MCG tablet Take 1 tablet by mouth  daily 90 tablet 0  . vitamin B-12 (CYANOCOBALAMIN) 500 MCG tablet Take 500 mcg by mouth daily.     . vitamin E 400 UNIT capsule Take 400 Units by mouth daily.     No current facility-administered medications for this visit.    Objective: BP 122/60 mmHg  Pulse 54  Temp(Src) 98.1 F (36.7 C)  Wt 150 lb (68.04 kg)  LMP  (LMP Unknown) Gen: NAD, resting comfortably, appears stated age CV: RRR no murmurs rubs or gallops Lungs: CTAB no crackles, wheeze, rhonchi Abdomen:  soft/nontender/nondistended/normal bowel sounds.  Ext: trace edema on left under compression stockings per baseline Skin: warm, dry Neuro: grossly normal, moves all extremities, normal gait and no assistive device  Assessment/Plan:  Hyperlipidemia S: Statin intolerant. On repatha controlled with LDL <70. No myalgias.  Lab Results  Component Value Date   CHOL 112* 11/14/2015   HDL 56 11/14/2015   LDLCALC 43 11/14/2015   LDLDIRECT 161.4 06/19/2011   TRIG 64 11/14/2015   CHOLHDL 2.0 11/14/2015   A/P: patient has concerns about being able to afford medicine- was in a program that reduced cost last year. Now cost over $400 a month. I encouraged her to follow up with lipid clinic to see if she could be reenrolled into prior program. At current cost would  be unsustainable and would have to come off the repatha    Essential hypertension S: controlled. atenolol  BID and amlodipine  BP Readings from Last 3 Encounters:  02/12/16 122/60  12/03/15 142/62  10/02/15 138/64  A/P:Continue current medications- goal <130/90 with prior CVA as long as not orthostatic   Hypothyroidism S: remains asymptomatc. Compliant with synthroid . TSH has been stable Lab Results  Component Value Date   TSH 0.62 03/27/2015  A/P: repeat TSH at follow up in 6 months. Deferred so could be with more full set of bloodwork   ADJUSTMENT DISORDER WITH ANXIOUS MOOD Trying to wean down Lorazepam 1/8th pill now and this is adequate for sleep   Return in about 6 months (around 08/11/2016) for annual wellness visit. Return precautions advised.   Meds ordered this encounter  Medications  . LORazepam (ATIVAN) 0.5 MG tablet    Sig: Take 0.5 tablets (0.25 mg total) by mouth at bedtime as needed for anxiety.    Dispense:  45 tablet    Refill:  1  . clopidogrel (PLAVIX) 75 MG tablet    Sig: Take 1 tablet (75 mg total) by mouth daily.    Dispense:  90 tablet    Refill:  3  . amLODipine (NORVASC) 5 MG tablet    Sig: TAKE ONE TABLET BY MOUTH ONCE DAILY    Dispense:  90 tablet    Refill:  3

## 2016-02-12 NOTE — Assessment & Plan Note (Signed)
S: controlled. atenolol  BID and amlodipine  BP Readings from Last 3 Encounters:  02/12/16 122/60  12/03/15 142/62  10/02/15 138/64  A/P:Continue current medications- goal <130/90 with prior CVA as long as not orthostatic

## 2016-02-12 NOTE — Assessment & Plan Note (Signed)
S: Statin intolerant. On repatha controlled with LDL <70. No myalgias.  Lab Results  Component Value Date   CHOL 112* 11/14/2015   HDL 56 11/14/2015   LDLCALC 43 11/14/2015   LDLDIRECT 161.4 06/19/2011   TRIG 64 11/14/2015   CHOLHDL 2.0 11/14/2015   A/P: patient has concerns about being able to afford medicine- was in a program that reduced cost last year. Now cost over $400 a month. I encouraged her to follow up with lipid clinic to see if she could be reenrolled into prior program. At current cost would be unsustainable and would have to come off the repatha

## 2016-02-12 NOTE — Patient Instructions (Addendum)
No changes today  Refilled medication that you requested  I think you are doing well- blood pressure looks great

## 2016-02-12 NOTE — Assessment & Plan Note (Signed)
Trying to wean down Lorazepam 1/8th pill now and this is adequate for sleep

## 2016-02-18 ENCOUNTER — Other Ambulatory Visit: Payer: Self-pay | Admitting: Pharmacist

## 2016-02-18 MED ORDER — ALIROCUMAB 75 MG/ML ~~LOC~~ SOPN: 75.0000 mg | PEN_INJECTOR | SUBCUTANEOUS | Status: DC

## 2016-03-05 ENCOUNTER — Encounter (INDEPENDENT_AMBULATORY_CARE_PROVIDER_SITE_OTHER): Payer: Self-pay

## 2016-03-05 DIAGNOSIS — Z0289 Encounter for other administrative examinations: Secondary | ICD-10-CM

## 2016-03-24 ENCOUNTER — Ambulatory Visit (INDEPENDENT_AMBULATORY_CARE_PROVIDER_SITE_OTHER): Payer: Medicare Other | Admitting: Family Medicine

## 2016-03-24 ENCOUNTER — Encounter: Payer: Self-pay | Admitting: Family Medicine

## 2016-03-24 ENCOUNTER — Telehealth: Payer: Self-pay | Admitting: Family Medicine

## 2016-03-24 VITALS — BP 154/60 | HR 56 | Temp 98.5°F | Wt 149.0 lb

## 2016-03-24 DIAGNOSIS — N390 Urinary tract infection, site not specified: Secondary | ICD-10-CM

## 2016-03-24 DIAGNOSIS — R300 Dysuria: Secondary | ICD-10-CM

## 2016-03-24 LAB — POC URINALSYSI DIPSTICK (AUTOMATED)
Bilirubin, UA: NEGATIVE
Blood, UA: NEGATIVE
Glucose, UA: NEGATIVE
Ketones, UA: NEGATIVE
NITRITE UA: NEGATIVE
PH UA: 6
PROTEIN UA: NEGATIVE
Spec Grav, UA: 1.01
Urobilinogen, UA: 0.2

## 2016-03-24 MED ORDER — CEPHALEXIN 500 MG PO CAPS: 500.0000 mg | ORAL_CAPSULE | Freq: Three times a day (TID) | ORAL | Status: DC

## 2016-03-24 NOTE — Telephone Encounter (Signed)
FYI

## 2016-03-24 NOTE — Progress Notes (Signed)
Joyce ConchStephen Hunter, MD  Subjective:  Joyce [name] is a 82 y.o. year old very pleasant female patient who presents for/with See problem oriented charting ROS- see ros below  Past Medical History-  Patient Active Problem List   Diagnosis Date Noted  . History of stroke     Priority: High  . TIA (transient ischemic attack) 05/16/2015    Priority: High  . CAD (coronary artery disease) 04/05/2008    Priority: High  . Hyperglycemia 08/28/2015    Priority: Medium  . Overactive bladder 08/14/2014    Priority: Medium  . Lumbar pain with radiation down left leg 02/21/2011    Priority: Medium  . Essential hypertension 01/17/2010    Priority: Medium  . Hyperlipidemia 01/15/2009    Priority: Medium  . ADJUSTMENT DISORDER WITH ANXIOUS MOOD 01/15/2009    Priority: Medium  . Hypothyroidism 04/05/2008    Priority: Medium  . IRRITABLE BOWEL SYNDROME, HX OF 04/05/2008    Priority: Medium  . CERUMEN IMPACTION, BILATERAL 11/27/2010    Priority: Low  . GERD 04/05/2008    Priority: Low  . INTERNAL HEMORRHOIDS 06/29/2002    Priority: Low  . Intracranial vascular stenosis 07/02/2015    Medications- reviewed and updated Current Outpatient Prescriptions  Medication Sig Dispense Refill  . Alirocumab (PRALUENT) 75 MG/ML SOPN Inject 75 mg into the skin every 14 (fourteen) days. 2 pen 6  . amLODipine (NORVASC) 5 MG tablet TAKE ONE TABLET BY MOUTH ONCE DAILY 90 tablet 3  . atenolol (TENORMIN) 25 MG tablet TAKE ONE TABLET BY MOUTH TWICE DAILY 180 tablet 3  . Biotin 1000 MCG tablet Take 1,000 mcg by mouth daily.    . clopidogrel (PLAVIX) 75 MG tablet Take 1 tablet (75 mg total) by mouth daily. 90 tablet 3  . CRANBERRY CONCENTRATE PO Take 1 tablet by mouth daily.    . Lactobacillus (ACIDOPHILUS) 10 MG CAPS Take 1 capsule by mouth daily.     . Omega-3 Fatty Acids (FISH OIL) 1000 MG CAPS Take 1 capsule by mouth daily.    Marland Kitchen. oxybutynin (DITROPAN) 5 MG tablet Take 5 mg by mouth 2 (two) times daily.     .  Probiotic Product (ALIGN PO) Take 1 capsule by mouth daily.    Marland Kitchen. pyridOXINE (B-6) 50 MG tablet Take 50 mg by mouth daily.    . ranitidine (ZANTAC) 150 MG capsule Take 150 mg by mouth 2 (two) times daily.    Marland Kitchen. SYNTHROID 100 MCG tablet Take 1 tablet by mouth  daily 90 tablet 0  . vitamin B-12 (CYANOCOBALAMIN) 500 MCG tablet Take 500 mcg by mouth daily.     . vitamin E 400 UNIT capsule Take 400 Units by mouth daily.    . cephALEXin (KEFLEX) 500 MG capsule Take 1 capsule (500 mg total) by mouth 3 (three) times daily. 21 capsule 0  . LORazepam (ATIVAN) 0.5 MG tablet Take 0.5 tablets (0.25 mg total) by mouth at bedtime as needed for anxiety. (Patient not taking: Reported on 03/24/2016) 45 tablet 1  . nitroGLYCERIN (NITROSTAT) 0.4 MG SL tablet Place 0.4 mg under the tongue every 5 (five) minutes as needed for chest pain. Reported on 03/24/2016     No current facility-administered medications for this visit.    Objective: BP 154/60 mmHg  Pulse 56  Temp(Src) 98.5 F (36.9 C)  Wt 149 lb (67.586 kg)  LMP  (LMP Unknown) Gen: NAD, resting comfortably CV: RRR no murmurs rubs or gallops Lungs: CTAB no crackles, wheeze, rhonchi  Abdomen: soft/nontender including no suprapubic pain/nondistended/normal bowel sounds. No rebound or guarding.  No cva pain Ext: trace edema under compression stockings Skin: warm, dry Neuro: grossly normal, moves all extremities  Results for orders placed or performed in visit on 03/24/16 (from the past 24 hour(s))  POCT Urinalysis Dipstick (Automated)     Status: Abnormal   Collection Time: 03/24/16  4:24 PM  Result Value Ref Range   Color, UA yellow    Clarity, UA clear    Glucose, UA n    Bilirubin, UA n    Ketones, UA n    Spec Grav, UA 1.010    Blood, UA n    pH, UA 6.0    Protein, UA n    Urobilinogen, UA 0.2    Nitrite, UA n    Leukocytes, UA Trace (A) Negative   Assessment/Plan:  Dysuria/polyuria S: started last night, with end of stream had moderate  level burning. Took some cranberry juice andd "cranactin". Got up 3x to pee, usually gets up once. This morning was a little less but started getting worse. Some polyuria but not as severe in the past. Last sepisode was 2 years ago.   Ros- no fever, chills, nausea, vomiting. No vaginal discharge. No CVA tenderness A/P: complicated UTI given age and in high risk patient with history of CAD and stroke. UA not particularly impressive but symptoms are very bothersome to patient. She asks for Uribel refill but we opted to treat alone instead with keflex  TID.   Return precautions advised. Will also monitor BP- controlled last visit. If persists elevated at follow up may need medication adjustments.   Orders Placed This Encounter  Procedures  . Urine culture    [facility]  . POCT Urinalysis Dipstick (Automated)    Meds ordered this encounter  Medications  . cephALEXin (KEFLEX) 500 MG capsule    Sig: Take 1 capsule (500 mg total) by mouth 3 (three) times daily.    Dispense:  21 capsule    Refill:  0

## 2016-03-24 NOTE — Patient Instructions (Signed)
Treat with cephalexin/keflex 3x a day for 7 days If you have fever, worsening symptoms please let me know  Urinary Tract Infection Urinary tract infections (UTIs) can develop anywhere along your urinary tract. Your urinary tract is your body's drainage system for removing wastes and extra water. Your urinary tract includes two kidneys, two ureters, a bladder, and a urethra. Your kidneys are a pair of bean-shaped organs. Each kidney is about the size of your fist. They are located below your ribs, one on each side of your spine. CAUSES Infections are caused by microbes, which are microscopic organisms, including fungi, viruses, and bacteria. These organisms are so small that they can only be seen through a microscope. Bacteria are the microbes that most commonly cause UTIs. SYMPTOMS  Symptoms of UTIs may vary by age and gender of the patient and by the location of the infection. Symptoms in young women typically include a frequent and intense urge to urinate and a painful, burning feeling in the bladder or urethra during urination. Older women and men are more likely to be tired, shaky, and weak and have muscle aches and abdominal pain. A fever may mean the infection is in your kidneys. Other symptoms of a kidney infection include pain in your back or sides below the ribs, nausea, and vomiting. DIAGNOSIS To diagnose a UTI, your caregiver will ask you about your symptoms. Your caregiver will also ask you to provide a urine sample. The urine sample will be tested for bacteria and white blood cells. White blood cells are made by your body to help fight infection. TREATMENT  Typically, UTIs can be treated with medication. Because most UTIs are caused by a bacterial infection, they usually can be treated with the use of antibiotics. The choice of antibiotic and length of treatment depend on your symptoms and the type of bacteria causing your infection. HOME CARE INSTRUCTIONS  If you were prescribed  antibiotics, take them exactly as your caregiver instructs you. Finish the medication even if you feel better after you have only taken some of the medication.  Drink enough water and fluids to keep your urine clear or pale yellow.  Avoid caffeine, tea, and carbonated beverages. They tend to irritate your bladder.  Empty your bladder often. Avoid holding urine for long periods of time.  Empty your bladder before and after sexual intercourse.  After a bowel movement, women should cleanse from front to back. Use each tissue only once. SEEK MEDICAL CARE IF:   You have back pain.  You develop a fever.  Your symptoms do not begin to resolve within 3 days. SEEK IMMEDIATE MEDICAL CARE IF:   You have severe back pain or lower abdominal pain.  You develop chills.  You have nausea or vomiting.  You have continued burning or discomfort with urination. MAKE SURE YOU:   Understand these instructions.  Will watch your condition.  Will get help right away if you are not doing well or get worse.   This information is not intended to replace advice given to you by your health care provider. Make sure you discuss any questions you have with your health care provider.   Document Released: 09/17/2005 Document Revised: 08/29/2015 Document Reviewed: 01/16/2012 Elsevier Interactive Patient Education [employer]2016 [employer]

## 2016-03-24 NOTE — Telephone Encounter (Signed)
[facility] - Client TELEPHONE ADVICE RECORD [facility]  Patient Name: Joyce BeechamJENNIE Buford  DOB: 1931/12/21    Initial Comment Caller states having bladder issues, her rx is out, burning with urination, urine clear   Nurse Assessment  Nurse: Laural BenesJohnson, RN, Dondra SpryGail Date/Time (Eastern Time): 03/24/2016 9:58:31 AM  Confirm and document reason for call. If symptomatic, describe symptoms. You must click the next button to save text entered. ---Tinnie GensJennie has burning with urination no blood frequency and urgency -- is out of urobel.  Has the patient traveled out of the country within the last 30 days? ---No  Does the patient have any new or worsening symptoms? ---Yes  Will a triage be completed? ---Yes  Related visit to physician within the last 2 weeks? ---No  Does the PT have any chronic conditions? (i.e. diabetes, asthma, etc.) ---Unknown  Is this a behavioral health or substance abuse call? ---No     Guidelines    Guideline Title Affirmed Question Affirmed Notes  Urinary Symptoms Urinating more frequently than usual (i.e., frequency)    Final Disposition User   See Physician within 24 Hours Laural BenesJohnson, RN, Dondra SpryGail    Comments  Note; 03/24/16 415pm w/ arrival time of 400pm with Aldine ContesStephen O. Hunter MD r/o UTI   Referrals  REFERRED TO PCP OFFICE   Disagree/Comply: Comply

## 2016-03-25 LAB — URINE CULTURE
COLONY COUNT: NO GROWTH
Organism ID, Bacteria: NO GROWTH

## 2016-03-27 ENCOUNTER — Other Ambulatory Visit: Payer: Self-pay | Admitting: Family Medicine

## 2016-08-11 ENCOUNTER — Other Ambulatory Visit: Payer: Self-pay | Admitting: Pharmacist

## 2016-08-11 MED ORDER — ALIROCUMAB 75 MG/ML ~~LOC~~ SOPN: 75.0000 mg | PEN_INJECTOR | SUBCUTANEOUS | 11 refills | Status: DC

## 2016-08-14 ENCOUNTER — Encounter: Payer: Self-pay | Admitting: Family Medicine

## 2016-08-14 ENCOUNTER — Ambulatory Visit (INDEPENDENT_AMBULATORY_CARE_PROVIDER_SITE_OTHER): Payer: Medicare Other | Admitting: Family Medicine

## 2016-08-14 ENCOUNTER — Other Ambulatory Visit: Payer: Self-pay | Admitting: Pharmacist

## 2016-08-14 VITALS — BP 140/72 | HR 54 | Temp 97.8°F | Wt 144.0 lb

## 2016-08-14 DIAGNOSIS — E034 Atrophy of thyroid (acquired): Secondary | ICD-10-CM | POA: Diagnosis not present

## 2016-08-14 DIAGNOSIS — E038 Other specified hypothyroidism: Secondary | ICD-10-CM | POA: Diagnosis not present

## 2016-08-14 DIAGNOSIS — F4322 Adjustment disorder with anxiety: Secondary | ICD-10-CM

## 2016-08-14 DIAGNOSIS — I10 Essential (primary) hypertension: Secondary | ICD-10-CM | POA: Diagnosis not present

## 2016-08-14 DIAGNOSIS — Z23 Encounter for immunization: Secondary | ICD-10-CM

## 2016-08-14 DIAGNOSIS — I2510 Atherosclerotic heart disease of native coronary artery without angina pectoris: Secondary | ICD-10-CM | POA: Diagnosis not present

## 2016-08-14 DIAGNOSIS — E785 Hyperlipidemia, unspecified: Secondary | ICD-10-CM | POA: Diagnosis not present

## 2016-08-14 DIAGNOSIS — Z780 Asymptomatic menopausal state: Secondary | ICD-10-CM

## 2016-08-14 DIAGNOSIS — N390 Urinary tract infection, site not specified: Secondary | ICD-10-CM | POA: Diagnosis not present

## 2016-08-14 LAB — COMPREHENSIVE METABOLIC PANEL
ALBUMIN: 4.2 g/dL (ref 3.5–5.2)
ALK PHOS: 51 U/L (ref 39–117)
ALT: 16 U/L (ref 0–35)
AST: 20 U/L (ref 0–37)
BUN: 14 mg/dL (ref 6–23)
CALCIUM: 9.2 mg/dL (ref 8.4–10.5)
CO2: 32 mEq/L (ref 19–32)
CREATININE: 0.58 mg/dL (ref 0.40–1.20)
Chloride: 101 mEq/L (ref 96–112)
GFR: 105.37 mL/min (ref >60.00)
Glucose, Bld: 92 mg/dL (ref 70–99)
POTASSIUM: 4.8 meq/L (ref 3.5–5.1)
SODIUM: 140 meq/L (ref 135–145)
TOTAL PROTEIN: 6.9 g/dL (ref 6.0–8.3)
Total Bilirubin: 1.5 mg/dL — ABNORMAL HIGH (ref 0.2–1.2)

## 2016-08-14 LAB — CBC
HEMATOCRIT: 42.9 % (ref 36.0–46.0)
Hemoglobin: 14.7 g/dL (ref 12.0–15.0)
MCHC: 34.3 g/dL (ref 30.0–36.0)
MCV: 87.8 fl (ref 78.0–100.0)
PLATELETS: 228 10*3/uL (ref 150.0–400.0)
RBC: 4.88 Mil/uL (ref 3.87–5.11)
RDW: 13.7 % (ref 11.5–15.5)
WBC: 5.2 10*3/uL (ref 4.0–10.5)

## 2016-08-14 LAB — POC URINALSYSI DIPSTICK (AUTOMATED)
Bilirubin, UA: NEGATIVE
Blood, UA: NEGATIVE
Glucose, UA: NEGATIVE
Ketones, UA: NEGATIVE
LEUKOCYTES UA: NEGATIVE
NITRITE UA: NEGATIVE
PH UA: 6.5
PROTEIN UA: NEGATIVE
Spec Grav, UA: <=1.005
UROBILINOGEN UA: 0.2

## 2016-08-14 LAB — LDL CHOLESTEROL, DIRECT: LDL DIRECT: 52 mg/dL

## 2016-08-14 LAB — TSH: TSH: 2.07 u[IU]/mL (ref 0.35–4.50)

## 2016-08-14 MED ORDER — LORAZEPAM 0.5 MG PO TABS: 0.2500 mg | ORAL_TABLET | Freq: Every evening | ORAL | 1 refills | Status: DC | PRN

## 2016-08-14 MED ORDER — ALIROCUMAB 75 MG/ML ~~LOC~~ SOPN: 75.0000 mg | PEN_INJECTOR | SUBCUTANEOUS | 11 refills | Status: DC

## 2016-08-14 NOTE — Assessment & Plan Note (Signed)
S: well controlled on synthroid 100mcg Lab Results  Component Value Date   TSH 0.62 03/27/2015  ROS-No hair or nail changes. Occasional hot flash- no night sweats. No constipation or diarrhea. Denies shakiness or anxiety.  A/P: update tsh today, suspect controlled

## 2016-08-14 NOTE — Addendum Note (Signed)
Addended by: Baldwin CrownJOHNSON, Nuel Dejaynes D on: 08/14/2016 12:51 PM   Modules accepted: Orders

## 2016-08-14 NOTE — Patient Instructions (Addendum)
You need your pneumovax 23 and flu shot   Urine culture- will send this before potentially treating you for UTI  Your blood pressure trend concerns me. I would like for you to use a home cuff to check at least 4x a week. Your goal is <140/90 (below 130 even better). If you note in the next few weeks that it is higher than our goal, see me sooner. Otherwise, see me in 4-6 weeks (or do this at your annual wellness visit) Bring your home cuff and your log of blood pressures with you to visit.   Labs before you leave  Schedule your bone density test at check out desk  I would also like for you to sign up for an annual wellness visit on a Friday with our nurse Darl PikesSusan before you see me again. This is a free benefit under medicare that may help us find additional ways to help you.

## 2016-08-14 NOTE — Progress Notes (Signed)
Subjective:  Joyce [name] is a 82 y.o. year old very pleasant female patient who presents for/with See problem oriented charting ROS- dysuria, polyuria for a week. No chest pain or shortness of breath. Low confidence so does not drive. .see any ROS included in HPI as well.   Past Medical History-  Patient Active Problem List   Diagnosis Date Noted  . History of stroke     Priority: High  . TIA (transient ischemic attack) 05/16/2015    Priority: High  . CAD (coronary artery disease) 04/05/2008    Priority: High  . Hyperglycemia 08/28/2015    Priority: Medium  . Overactive bladder 08/14/2014    Priority: Medium  . Lumbar pain with radiation down left leg 02/21/2011    Priority: Medium  . Essential hypertension 01/17/2010    Priority: Medium  . Hyperlipidemia 01/15/2009    Priority: Medium  . ADJUSTMENT DISORDER WITH ANXIOUS MOOD 01/15/2009    Priority: Medium  . Hypothyroidism 04/05/2008    Priority: Medium  . IRRITABLE BOWEL SYNDROME, HX OF 04/05/2008    Priority: Medium  . CERUMEN IMPACTION, BILATERAL 11/27/2010    Priority: Low  . GERD 04/05/2008    Priority: Low  . INTERNAL HEMORRHOIDS 06/29/2002    Priority: Low  . Intracranial vascular stenosis 07/02/2015    Medications- reviewed and updated Current Outpatient Prescriptions  Medication Sig Dispense Refill  . Alirocumab (PRALUENT) 75 MG/ML SOPN Inject 75 mg into the skin every 14 (fourteen) days. 2 pen 11  . amLODipine (NORVASC) 5 MG tablet TAKE ONE TABLET BY MOUTH ONCE DAILY 90 tablet 3  . atenolol (TENORMIN) 25 MG tablet TAKE ONE TABLET BY MOUTH TWICE DAILY 180 tablet 3  . Biotin 1000 MCG tablet Take 1,000 mcg by mouth daily.    . clopidogrel (PLAVIX) 75 MG tablet Take 1 tablet (75 mg total) by mouth daily. 90 tablet 3  . CRANBERRY CONCENTRATE PO Take 1 tablet by mouth daily.    . Lactobacillus (ACIDOPHILUS) 10 MG CAPS Take 1 capsule by mouth daily.     Marland Kitchen LORazepam (ATIVAN) 0.5 MG tablet Take 0.5 tablets (0.25  mg total) by mouth at bedtime as needed for anxiety. 45 tablet 1  . nitroGLYCERIN (NITROSTAT) 0.4 MG SL tablet Place 0.4 mg under the tongue every 5 (five) minutes as needed for chest pain. Reported on 03/24/2016    . Omega-3 Fatty Acids (FISH OIL) 1000 MG CAPS Take 1 capsule by mouth daily.    Marland Kitchen oxybutynin (DITROPAN) 5 MG tablet Take 5 mg by mouth 2 (two) times daily.     . Probiotic Product (ALIGN PO) Take 1 capsule by mouth daily.    Marland Kitchen pyridOXINE (B-6) 50 MG tablet Take 50 mg by mouth daily.    . ranitidine (ZANTAC) 150 MG capsule Take 150 mg by mouth 2 (two) times daily.    Marland Kitchen SYNTHROID 100 MCG tablet Take 1 tablet by mouth  daily 90 tablet 3  . vitamin B-12 (CYANOCOBALAMIN) 500 MCG tablet Take 500 mcg by mouth daily.     . vitamin E 400 UNIT capsule Take 400 Units by mouth daily.     No current facility-administered medications for this visit.     Objective: BP 140/72   Pulse (!) 54   Temp 97.8 F (36.6 C) (Oral)   Wt 144 lb (65.3 kg)   LMP  (LMP Unknown)   SpO2 96%   BMI 29.08 kg/m  Gen: NAD, resting comfortably, appears stated age CV: RRR  no murmurs rubs or gallops. Rate >60 on exam Lungs: CTAB no crackles, wheeze, rhonchi Abdomen: soft/nontender/nondistended/normal bowel sounds. No rebound or guarding.  Ext: no edema Skin: warm, dry, no rash Neuro: grossly normal, moves all extremities, walks without assistive device  Assessment/Plan:  Dysuria S: and frequency of urination for 1 week.  A/P: Urine culture last visit was negative- was treated beforehand. Will get culture before treating this time. UA still pending- was not obvious UTI last time.   Postmenopausal- get first DEXA  Hyperlipidemia S: well controlled on praluent- statin intolerant. LDL goal under 70 No myalgias.  Lab Results  Component Value Date   CHOL 112 (L) 11/14/2015   HDL 56 11/14/2015   LDLCALC 43 11/14/2015   LDLDIRECT 161.4 06/19/2011   TRIG 64 11/14/2015   CHOLHDL 2.0 11/14/2015   A/P: update  ldl today  Essential hypertension S: poorly controlled on atenolol 25mg  BID and amlodipine 5mg . Prior CVA so goal <140/90. Brings house call note and was 140/78. Water aerobics twice a week BP Readings from Last 3 Encounters:  08/14/16 140/72  03/24/16 (!) 154/60  02/12/16 122/60  A/P:Continue current meds for now. Discussed she is above goal- she had hectic morning with car breaking down. She requests home monitoring before adjustments- see Avs- plan to recheck within 6 weeks. Considering starting lisinopril 2.5 or 5mg  as next step  Hypothyroidism S: well controlled on synthroid 100mcg Lab Results  Component Value Date   TSH 0.62 03/27/2015  ROS-No hair or nail changes. Occasional hot flash- no night sweats. No constipation or diarrhea. Denies shakiness or anxiety.  A/P: update tsh today, suspect controlled  1 month awv or see me for BP 6 months follow up  Orders Placed This Encounter  Procedures  . Urine culture  . DG Bone Density    Standing Status:   Future    Standing Expiration Date:   10/14/2017    Order Specific Question:   Reason for Exam (SYMPTOM  OR DIAGNOSIS REQUIRED)    Answer:   postmenopausal    Order Specific Question:   Preferred imaging location?    Answer:   [facility]  . Flu Vaccine QUAD 36+ mos IM  . Pneumococcal polysaccharide vaccine 23-valent greater than or equal to 2yo subcutaneous/IM  . CBC    [place]  . Comprehensive metabolic panel    [place]  . LDL cholesterol, direct    [place]  . TSH      . POCT Urinalysis Dipstick (Automated)   Viewed urine- unremarkable dipstick- will remove UA order as cannot close chart and has not yet been entered by lab For anxiety Meds ordered this encounter  Medications  . LORazepam (ATIVAN) 0.5 MG tablet    Sig: Take 0.5 tablets (0.25 mg total) by mouth at bedtime as needed for anxiety.    Dispense:  45 tablet    Refill:  1   Return precautions advised.  Tana ConchStephen Vince Ainsley, MD

## 2016-08-14 NOTE — Assessment & Plan Note (Signed)
S: well controlled on praluent- statin intolerant. LDL goal under 70 No myalgias.  Lab Results  Component Value Date   CHOL 112 (L) 11/14/2015   HDL 56 11/14/2015   LDLCALC 43 11/14/2015   LDLDIRECT 161.4 06/19/2011   TRIG 64 11/14/2015   CHOLHDL 2.0 11/14/2015   A/P: update ldl today

## 2016-08-14 NOTE — Progress Notes (Signed)
Pre visit review using our clinic review tool, if applicable. No additional management support is needed unless otherwise documented below in the visit note. 

## 2016-08-14 NOTE — Assessment & Plan Note (Signed)
S: poorly controlled on atenolol 25mg  BID and amlodipine 5mg . Prior CVA so goal <140/90. Brings house call note and was 140/78. Water aerobics twice a week BP Readings from Last 3 Encounters:  08/14/16 140/72  03/24/16 (!) 154/60  02/12/16 122/60  A/P:Continue current meds for now. Discussed she is above goal- she had hectic morning with car breaking down. She requests home monitoring before adjustments- see Avs- plan to recheck within 6 weeks. Considering starting lisinopril 2.5 or 5mg  as next step

## 2016-08-16 LAB — URINE CULTURE: Organism ID, Bacteria: 10000

## 2016-08-18 ENCOUNTER — Other Ambulatory Visit: Payer: Self-pay

## 2016-08-18 DIAGNOSIS — R300 Dysuria: Secondary | ICD-10-CM

## 2016-08-18 DIAGNOSIS — R350 Frequency of micturition: Secondary | ICD-10-CM

## 2016-09-02 ENCOUNTER — Ambulatory Visit (INDEPENDENT_AMBULATORY_CARE_PROVIDER_SITE_OTHER)
Admission: RE | Admit: 2016-09-02 | Discharge: 2016-09-02 | Disposition: A | Discharge status: Home / Self Care | Payer: Medicare Other | Source: Ambulatory Visit | Attending: Family Medicine | Admitting: Family Medicine

## 2016-09-02 DIAGNOSIS — Z780 Asymptomatic menopausal state: Secondary | ICD-10-CM | POA: Diagnosis not present

## 2016-09-05 ENCOUNTER — Other Ambulatory Visit: Payer: Self-pay

## 2016-09-05 DIAGNOSIS — M810 Age-related osteoporosis without current pathological fracture: Secondary | ICD-10-CM

## 2016-09-05 MED ORDER — ALENDRONATE SODIUM 70 MG PO TABS: 70.0000 mg | ORAL_TABLET | ORAL | 11 refills | Status: DC

## 2016-09-23 DIAGNOSIS — N952 Postmenopausal atrophic vaginitis: Secondary | ICD-10-CM | POA: Diagnosis not present

## 2016-09-23 DIAGNOSIS — R300 Dysuria: Secondary | ICD-10-CM | POA: Diagnosis not present

## 2016-09-23 DIAGNOSIS — R310 Gross hematuria: Secondary | ICD-10-CM | POA: Diagnosis not present

## 2016-09-25 ENCOUNTER — Other Ambulatory Visit: Payer: Self-pay | Admitting: Cardiovascular Disease

## 2016-09-25 DIAGNOSIS — I25118 Atherosclerotic heart disease of native coronary artery with other forms of angina pectoris: Secondary | ICD-10-CM

## 2016-09-25 DIAGNOSIS — I10 Essential (primary) hypertension: Secondary | ICD-10-CM

## 2016-09-25 DIAGNOSIS — E785 Hyperlipidemia, unspecified: Secondary | ICD-10-CM

## 2016-10-14 ENCOUNTER — Encounter: Payer: Self-pay | Admitting: Family Medicine

## 2016-10-14 ENCOUNTER — Ambulatory Visit (INDEPENDENT_AMBULATORY_CARE_PROVIDER_SITE_OTHER): Payer: Medicare Other | Admitting: Family Medicine

## 2016-10-14 VITALS — BP 140/60 | HR 58 | Temp 97.7°F | Wt 145.8 lb

## 2016-10-14 DIAGNOSIS — I10 Essential (primary) hypertension: Secondary | ICD-10-CM | POA: Diagnosis not present

## 2016-10-14 DIAGNOSIS — J0110 Acute frontal sinusitis, unspecified: Secondary | ICD-10-CM | POA: Diagnosis not present

## 2016-10-14 MED ORDER — DOXYCYCLINE HYCLATE 100 MG PO TABS: 100.0000 mg | ORAL_TABLET | Freq: Two times a day (BID) | ORAL | 0 refills | Status: DC

## 2016-10-14 NOTE — Patient Instructions (Signed)
Sinsusitis Viral based on <10 days, , lack of severity of symptoms in first 3 days. I am concerned that symptoms are worsening in last day- we discussed that if she continues to worsen tomorrow or if symptoms last through 3 more days- to pick up doxycycline antibiotic. She can also use plain mucinex to help loosen congestion. Has allegy to PCN (avoid augmentin as first step) and levaquin so go with doxycycline. Blood pressure hair high but ill today- if up when no longer ill likely start liisnopril 2.5mg   Finally, we reviewed reasons to return to care including if symptoms worsen or persist or new concerns arise (especially fever or shortness of breath).  Meds ordered this encounter  Medications  . doxycycline (VIBRA-TABS) 100 MG tablet    Sig: Take 1 tablet (100 mg total) by mouth 2 (two) times daily.    Dispense:  14 tablet    Refill:  0

## 2016-10-14 NOTE — Progress Notes (Signed)
BP Readings from Last 3 Encounters:  10/14/16 140/60  08/14/16 140/72  03/24/16 (!) 154/60   Recheck  1 wk, nasal- yellow, green, brown. Not getting any better-. Cough doing better. Frontal sinus pressure getting worse. Decreased voice.   Left maxillar   neti pot, steam.   Doxy  pcn- diarrhea, nausea, vomiting Itching levaquin  PCP: Tana ConchStephen Brynnlee Cumpian, MD  Subjective:  Joyce [name] is a 82 y.o. year old very pleasant female patient who presents with sinusitis symptoms including nasal congestion, sinus tenderness, loss of voice. Has green/yellow discharge. Pressure in bilateral frontal sinuses, some in left maxillary -other symptoms include: no sore throat -day of illness:7 -Symptoms are worsening -previous treatments: neti pot, steam  ROS-denies fever, SOB, NVD, tooth pain  Pertinent Past Medical History-  Patient Active Problem List   Diagnosis Date Noted  . History of stroke     Priority: High  . TIA (transient ischemic attack) 05/16/2015    Priority: High  . CAD (coronary artery disease) 04/05/2008    Priority: High  . Hyperglycemia 08/28/2015    Priority: Medium  . Overactive bladder 08/14/2014    Priority: Medium  . Lumbar pain with radiation down left leg 02/21/2011    Priority: Medium  . Essential hypertension 01/17/2010    Priority: Medium  . Hyperlipidemia 01/15/2009    Priority: Medium  . ADJUSTMENT DISORDER WITH ANXIOUS MOOD 01/15/2009    Priority: Medium  . Hypothyroidism 04/05/2008    Priority: Medium  . IRRITABLE BOWEL SYNDROME, HX OF 04/05/2008    Priority: Medium  . CERUMEN IMPACTION, BILATERAL 11/27/2010    Priority: Low  . GERD 04/05/2008    Priority: Low  . INTERNAL HEMORRHOIDS 06/29/2002    Priority: Low  . Intracranial vascular stenosis 07/02/2015    Medications- reviewed  Current Outpatient Prescriptions  Medication Sig Dispense Refill  . alendronate (FOSAMAX) 70 MG tablet Take 1 tablet (70 mg total) by mouth every 7 (seven) days.  Take with a full glass of water on an empty stomach. 4 tablet 11  . Alirocumab (PRALUENT) 75 MG/ML SOPN Inject 75 mg into the skin every 14 (fourteen) days. 2 pen 11  . amLODipine (NORVASC) 5 MG tablet TAKE ONE TABLET BY MOUTH ONCE DAILY 90 tablet 3  . atenolol (TENORMIN) 25 MG tablet Take 1 tablet (25 mg total) by mouth 2 (two) times daily. Please call and schedule a one year follow up appointment 180 tablet 0  . Biotin 1000 MCG tablet Take 1,000 mcg by mouth daily.    . clopidogrel (PLAVIX) 75 MG tablet Take 1 tablet (75 mg total) by mouth daily. 90 tablet 3  . CRANBERRY CONCENTRATE PO Take 1 tablet by mouth daily.    . Lactobacillus (ACIDOPHILUS) 10 MG CAPS Take 1 capsule by mouth daily.     Marland Kitchen. LORazepam (ATIVAN) 0.5 MG tablet Take 0.5 tablets (0.25 mg total) by mouth at bedtime as needed for anxiety. 45 tablet 1  . nitroGLYCERIN (NITROSTAT) 0.4 MG SL tablet Place 0.4 mg under the tongue every 5 (five) minutes as needed for chest pain. Reported on 03/24/2016    . Omega-3 Fatty Acids (FISH OIL) 1000 MG CAPS Take 1 capsule by mouth daily.    Marland Kitchen. oxybutynin (DITROPAN) 5 MG tablet Take 5 mg by mouth 2 (two) times daily.     . Probiotic Product (ALIGN PO) Take 1 capsule by mouth daily.    Marland Kitchen. pyridOXINE (B-6) 50 MG tablet Take 50 mg by mouth daily.    .Marland Kitchen  ranitidine (ZANTAC) 150 MG capsule Take 150 mg by mouth 2 (two) times daily.    Marland Kitchen SYNTHROID 100 MCG tablet Take 1 tablet by mouth  daily 90 tablet 3  . vitamin B-12 (CYANOCOBALAMIN) 500 MCG tablet Take 500 mcg by mouth daily.     . vitamin E 400 UNIT capsule Take 400 Units by mouth daily.     No current facility-administered medications for this visit.     Objective: BP 140/60 (BP Location: Left Arm, Patient Position: Sitting, Cuff Size: Normal)   Pulse (!) 58   Temp 97.7 F (36.5 C) (Oral)   Wt 145 lb 12.8 oz (66.1 kg)   LMP  (LMP Unknown)   SpO2 95%   BMI 29.45 kg/m  Gen: NAD, resting comfortably HEENT: Turbinates erythematous with  yellow/green drainage, TM normal, pharynx mildly erythematous with no tonsilar exudate or edema, left maxillary sinus tenderness CV: RRR no murmurs rubs or gallops Lungs: CTAB no crackles, wheeze, rhonchi Abdomen: soft/nontender/nondistended/normal bowel sounds. No rebound or guarding.  Ext: no edema Skin: warm, dry, no rash Neuro: grossly normal, moves all extremities  Assessment/Plan:  Sinsusitis Viral based on <10 days, , lack of severity of symptoms in first 3 days. I am concerned that symptoms are worsening in last day- we discussed that if she continues to worsen tomorrow or if symptoms last through 3 more days- to pick up doxycycline antibiotic. She can also use plain mucinex to help loosen congestion. Has allegy to PCN (avoid augmentin as first step) and levaquin so go with doxycycline. Blood pressure hair high but ill today- if up when no longer ill likely start liisnopril 2.5mg   Finally, we reviewed reasons to return to care including if symptoms worsen or persist or new concerns arise (especially fever or shortness of breath).  Meds ordered this encounter  Medications  . doxycycline (VIBRA-TABS) 100 MG tablet    Sig: Take 1 tablet (100 mg total) by mouth 2 (two) times daily.    Dispense:  14 tablet    Refill:  0    Tana Conch, MD

## 2016-10-14 NOTE — Progress Notes (Signed)
Pre visit review using our clinic review tool, if applicable. No additional management support is needed unless otherwise documented below in the visit note. 

## 2016-10-16 NOTE — Progress Notes (Signed)
Subjective:   Joyce [name] is a 82 y.o. female who presents for   Medicare Annual (Subsequent) preventive examination. The Patient was informed that the wellness visit is to identify future health risk and educate and initiate measures that can reduce risk for increased disease through the lifespan.    NO ROS; Medicare Wellness Visit  Describes health as good, fair or great? Fair  Will start antibiotics today as she is still symptomatic for cough  from last visit with Dr. Durene CalHunter on 10/24  Preventive Screening -Counseling & Management   Current smoking/ tobacco status/ former; quit 84 30 pack years;  ETOH no  Psychosocial; lives alone since spouse died x 523 yo Stays actively engaged  She currently coordinates drivers for  [employer] Also volunteers at [employer] She uses [employer] as she has never driven    RISK FACTORS Mammogram; 07/2014/  Will consider getting one; undecided   Dexa 08/2016 (femur -2.7 and -2.5) Currently taking Fosamax; tolerating well;    Regular exercise: water aerobics Monday and Wed Diet: Breakfast; oatmeal; egg and toast; fruit Lunch; peanut butter and jelly; soup and salad Supper; steak; fish wed and friday Drinks water at every meal; Cranberry juice at every meal   Fall risk Numbness in toes;  no falls Mobility of Functional changes this year? no  Meds; states she does not have issues with her meds. Gives herself a shot for chol medicine  States she is very careful and lays her meds out each day.  ;   Cardiac Risk Factors:  Hx of Coronary angioplasty with stent; 2002 Advanced aged  18>65 in women Hyperlipidemia medically controlled  Has NTG but denies chest pain  Family History (mother had HD; father had CAD)    Depression Screen PhQ 2: negative  Activities of Daily Living - See functional screen   Hearing Difficulty: right 2000; left 1000; may have had perforated eardrum as a child. Also have a cold  today  Ophthalmology Exam:  Every year; Dr. Blima LedgerSally Miller    Cognitive testing; Ad8 score; 0 or less than 2  MMSE deferred or completed if AD8 + 2 issues Has dtr who lives a mile from; is her [employer]ny Pique[place]CPOA  Pays her bills   Long Term Plan Dtr would like for her to move in with her.  Thinking about her options  Remains on 2st floor townhouse; no failures; Has walk in shower;  Came from a family of 14 children;  May be happier in a place where people are around all the time INvolved in the church Support group  Advanced Directives YES; copy requested  List the name of Physicians or other Practitioners you currently use:   Immunization History  Administered Date(s) Administered  . Influenza Split 09/15/2011, 10/05/2012  . Influenza Whole 10/26/2009, 09/21/2010  . Influenza,inj,Quad PF,36+ Mos 10/25/2013, 09/25/2014, 10/02/2015, 08/14/2016  . Pneumococcal Conjugate-13 03/27/2015  . Pneumococcal Polysaccharide-23 08/14/2016  . Tdap 07/22/2012  . Zoster 06/09/2011   Required Immunizations needed today  Screening test up to date or reviewed for plan of completion Health Maintenance Due  Topic Date Due  . TETANUS/TDAP  01/12/1950   TDAP; stated she had a tetanus Pulled out a form with all of her history and it was noted she had a tetanus in August 2013; Epic was updated       Objective:     Vitals: BP 126/60   Pulse (!) 54   Ht 4\' 11"  (1.499 m)  Wt 145 lb 2 oz (65.8 kg)   LMP  (LMP Unknown)   SpO2 97%   BMI 29.31 kg/m   Body mass index is 29.31 kg/m.   Tobacco History  Smoking Status  . Former Smoker  . Packs/day: 1.50  . Years: 20.00  . Types: Cigarettes  . Quit date: 07/31/1983  Smokeless Tobacco  . Never Used     Counseling given: Yes   Past Medical History:  Diagnosis Date  . Adjustment disorder with anxiety STRESS CHEST DISCOMFORT   PT RECENTLY PUT HUSBAND IN NURSING HOME  . Borderline glaucoma   . Candidiasis of skin and nails   . Coronary  atherosclerosis of unspecified type of vessel, native or graft CARDIOLOGIST- DR [facility]   DENIES S & S  . GERD (gastroesophageal reflux disease)   . History of sleep apnea YRS AGO ON CPAP UNTIL LOST WT  . Hyperlipidemia   . Hypothyroidism   . IBS (irritable bowel syndrome)   . Internal hemorrhoids without mention of complication   . Lesion of bladder   . Nocturia   . Numbness and tingling of foot LEFT -- SECONDARY TO PINCHED LUMBAR NERVE  . Pinched nerve LUMBAR   RESIDUAL LEFT FOOT NUMBNESS/ TINGLING  . Scoliosis   . Sinusitis 09/29/2013  . Stress incontinence   . Unspecified essential hypertension    Past Surgical History:  Procedure Laterality Date  . CARDIAC CATHETERIZATION  01-10-2003   DR Chrissie Noa DOWNEY   PATENT CIRCUMFLEX STENT/ MODERATE CAD ELSEWHERE  . CARDIOVASCULAR STRESS TEST  07-27-2012   NORMAL NUCLEAR STUDY/ LVEF 81%  . CATARACT EXTRACTION W/ INTRAOCULAR LENS  IMPLANT, BILATERAL    . CHOLECYSTECTOMY  1976  . CORONARY ANGIOPLASTY WITH STENT PLACEMENT  08-31-2001   DR Smitty Cords BRODIE   STENTING OF PROXIMAL CIRCUMFLEX (75% TO 0%)   . CYSTOSCOPY WITH BIOPSY  08/02/2012   Procedure: CYSTOSCOPY WITH BIOPSY;  Surgeon: Milford Cage, MD;  Location: [facility];  Service: Urology;  Laterality: N/A;  . VAGINAL HYSTERECTOMY  1968   Family History  Problem Relation Age of Onset  . Heart disease Mother     per patient died of old age at 41  . CAD Father     massive MI age 95   History  Sexual Activity  . Sexual activity: No    Outpatient Encounter Prescriptions as of 10/17/2016  Medication Sig  . alendronate (FOSAMAX) 70 MG tablet Take 1 tablet (70 mg total) by mouth every 7 (seven) days. Take with a full glass of water on an empty stomach.  . Alirocumab (PRALUENT) 75 MG/ML SOPN Inject 75 mg into the skin every 14 (fourteen) days.  Marland Kitchen amLODipine (NORVASC) 5 MG tablet TAKE ONE TABLET BY MOUTH ONCE DAILY  . atenolol (TENORMIN) 25 MG tablet Take 1  tablet (25 mg total) by mouth 2 (two) times daily. Please call and schedule a one year follow up appointment  . Biotin 1000 MCG tablet Take 1,000 mcg by mouth daily.  . clopidogrel (PLAVIX) 75 MG tablet Take 1 tablet (75 mg total) by mouth daily.  Marland Kitchen CRANBERRY CONCENTRATE PO Take 1 tablet by mouth daily.  Marland Kitchen doxycycline (VIBRA-TABS) 100 MG tablet Take 1 tablet (100 mg total) by mouth 2 (two) times daily.  . Lactobacillus (ACIDOPHILUS) 10 MG CAPS Take 1 capsule by mouth daily.   Marland Kitchen LORazepam (ATIVAN) 0.5 MG tablet Take 0.5 tablets (0.25 mg total) by mouth at bedtime as needed for anxiety.  Marland Kitchen  nitroGLYCERIN (NITROSTAT) 0.4 MG SL tablet Place 0.4 mg under the tongue every 5 (five) minutes as needed for chest pain. Reported on 03/24/2016  . Omega-3 Fatty Acids (FISH OIL) 1000 MG CAPS Take 1 capsule by mouth daily.  Marland Kitchen oxybutynin (DITROPAN) 5 MG tablet Take 5 mg by mouth 2 (two) times daily.   . Probiotic Product (ALIGN PO) Take 1 capsule by mouth daily.  Marland Kitchen pyridOXINE (B-6) 50 MG tablet Take 50 mg by mouth daily.  . ranitidine (ZANTAC) 150 MG capsule Take 150 mg by mouth 2 (two) times daily.  Marland Kitchen SYNTHROID 100 MCG tablet Take 1 tablet by mouth  daily  . vitamin B-12 (CYANOCOBALAMIN) 500 MCG tablet Take 500 mcg by mouth daily.   . vitamin E 400 UNIT capsule Take 400 Units by mouth daily.   No facility-administered encounter medications on file as of 10/17/2016.     Activities of Daily Living In your present state of health, do you have any difficulty performing the following activities: 10/17/2016  Hearing? N  Vision? N  Walking or climbing stairs? Y  Dressing or bathing? N  Doing errands, shopping? N  Preparing Food and eating ? N  Using the Toilet? N  In the past six months, have you accidently leaked urine? Y  Do you have problems with loss of bowel control? N  Managing your Medications? N  Managing your Finances? N  Housekeeping or managing your Housekeeping? N  Some recent data might be  hidden    Patient Care Team: Shelva Majestic, MD as PCP - General (Family Medicine) Tonny Bollman, MD as Attending Physician (Cardiology)    Assessment:    Discussed Recommended screenings and documented any personalized health advice and referrals for preventive counseling.  See AVS for patient instructions;   Exercise Activities and Dietary recommendations Current Exercise Habits: Home exercise routine;Structured exercise class, Time (Minutes): 60, Intensity: Intense  Goals    . Exercise 150 minutes per week (moderate activity)          Continue the water aerobics Drink more water   Use an exercise band at home / start low and slow; if it makes shoulders worse stop      Fall Risk Fall Risk  10/17/2016 08/14/2016 03/27/2015  Falls in the past year? No No No   Depression Screen PHQ 2/9 Scores 10/17/2016 08/14/2016 05/19/2015 03/27/2015  PHQ - 2 Score 0 0 0 0  PHQ- 9 Score - - - -     Cognitive Function MMSE - Mini Mental State Exam 10/17/2016  Not completed: (No Data)    Ad8 score 0     Immunization History  Administered Date(s) Administered  . Influenza Split 09/15/2011, 10/05/2012  . Influenza Whole 10/26/2009, 09/21/2010  . Influenza,inj,Quad PF,36+ Mos 10/25/2013, 09/25/2014, 10/02/2015, 08/14/2016  . Pneumococcal Conjugate-13 03/27/2015  . Pneumococcal Polysaccharide-23 08/14/2016  . Tdap 07/22/2012  . Zoster 06/09/2011   Screening Tests Health Maintenance  Topic Date Due  . TETANUS/TDAP  01/12/1950  . INFLUENZA VACCINE  Completed  . DEXA SCAN  Completed  . ZOSTAVAX  Completed  . PNA vac Low Risk Adult  Completed      Plan:   Still c/o of cough; will start her antibiotics as advised last OV  Educated regarding Tetanus and had confirmed through her notes that tetanus was taken 07/2012;  Considering plans for her on long term care No failures at independent living. Does not drive; uses and volunteers for [employer]   May consider  a  mammogram;   No issues verbalized getting or taking her medicine. Could verbalize what she was taking and included her injections;   During the course of the visit the patient was educated and counseled about the following appropriate screening and preventive services:   Vaccines to include Pneumoccal, Influenza, Hepatitis B, Td, Zostavax, HCV  Electrocardiogram  Cardiovascular Disease  Colorectal cancer screening aged out  Bone density screening- just completed(on med)   Diabetes screening neg  Glaucoma screening annual eye exams  Mammography/ will consider;   Nutrition counseling   Patient Instructions (the written plan) was given to the patient.   Montine Circle, RN  10/17/2016

## 2016-10-17 ENCOUNTER — Ambulatory Visit (INDEPENDENT_AMBULATORY_CARE_PROVIDER_SITE_OTHER): Payer: Medicare Other

## 2016-10-17 VITALS — BP 126/60 | HR 54 | Ht 59.0 in | Wt 145.1 lb

## 2016-10-17 DIAGNOSIS — Z0000 Encounter for general adult medical examination without abnormal findings: Secondary | ICD-10-CM | POA: Diagnosis not present

## 2016-10-17 NOTE — Progress Notes (Signed)
I have reviewed and agree with note, evaluation, plan.   Darl PikesSusan- can you please elaborate on tetanus shot 07/2012? If you can confirm this date- can you please abstract this in? Thanks.   Tana ConchStephen Ruie Sendejo, MD

## 2016-10-17 NOTE — Patient Instructions (Addendum)
[name] , Thank you for taking time to come for your Medicare Wellness Visit. I appreciate your ongoing commitment to your health goals. Please review the following plan we discussed and let me know if I can assist you in the future.   Will consider mammogram   Will stay active!  Entered Tetanus for 07/2012   Will start antibiotics as directed by Dr. Yong Channel   These are the goals we discussed: Goals    . Exercise 150 minutes per week (moderate activity)          Continue the water aerobics Drink more water   Use an exercise band at home / start low and slow; if it makes shoulders worse stop       This is a list of the screening recommended for you and due dates:  Health Maintenance  Topic Date Due  . Tetanus Vaccine  01/12/1950  . Flu Shot  Completed  . DEXA scan (bone density measurement)  Completed  . Shingles Vaccine  Completed  . Pneumonia vaccines  Completed      Fall Prevention in the Home  Falls can cause injuries. They can happen to people of all ages. There are many things you can do to make your home safe and to help prevent falls.  WHAT CAN I DO ON THE OUTSIDE OF MY HOME?  Regularly fix the edges of walkways and driveways and fix any cracks.  Remove anything that might make you trip as you walk through a door, such as a raised step or threshold.  Trim any bushes or trees on the path to your home.  Use bright outdoor lighting.  Clear any walking paths of anything that might make someone trip, such as rocks or tools.  Regularly check to see if handrails are loose or broken. Make sure that both sides of any steps have handrails.  Any raised decks and porches should have guardrails on the edges.  Have any leaves, snow, or ice cleared regularly.  Use sand or salt on walking paths during winter.  Clean up any spills in your garage right away. This includes oil or grease spills. WHAT CAN I DO IN THE BATHROOM?   Use night lights.  Install grab bars  by the toilet and in the tub and shower. Do not use towel bars as grab bars.  Use non-skid mats or decals in the tub or shower.  If you need to sit down in the shower, use a plastic, non-slip stool.  Keep the floor dry. Clean up any water that spills on the floor as soon as it happens.  Remove soap buildup in the tub or shower regularly.  Attach bath mats securely with double-sided non-slip rug tape.  Do not have throw rugs and other things on the floor that can make you trip. WHAT CAN I DO IN THE BEDROOM?  Use night lights.  Make sure that you have a light by your bed that is easy to reach.  Do not use any sheets or blankets that are too big for your bed. They should not hang down onto the floor.  Have a firm chair that has side arms. You can use this for support while you get dressed.  Do not have throw rugs and other things on the floor that can make you trip. WHAT CAN I DO IN THE KITCHEN?  Clean up any spills right away.  Avoid walking on wet floors.  Keep items that you use a lot in  easy-to-reach places.  If you need to reach something above you, use a strong step stool that has a grab bar.  Keep electrical cords out of the way.  Do not use floor polish or wax that makes floors slippery. If you must use wax, use non-skid floor wax.  Do not have throw rugs and other things on the floor that can make you trip. WHAT CAN I DO WITH MY STAIRS?  Do not leave any items on the stairs.  Make sure that there are handrails on both sides of the stairs and use them. Fix handrails that are broken or loose. Make sure that handrails are as long as the stairways.  Check any carpeting to make sure that it is firmly attached to the stairs. Fix any carpet that is loose or worn.  Avoid having throw rugs at the top or bottom of the stairs. If you do have throw rugs, attach them to the floor with carpet tape.  Make sure that you have a light switch at the top of the stairs and the  bottom of the stairs. If you do not have them, ask someone to add them for you. WHAT ELSE CAN I DO TO HELP PREVENT FALLS?  Wear shoes that:  Do not have high heels.  Have rubber bottoms.  Are comfortable and fit you well.  Are closed at the toe. Do not wear sandals.  If you use a stepladder:  Make sure that it is fully opened. Do not climb a closed stepladder.  Make sure that both sides of the stepladder are locked into place.  Ask someone to hold it for you, if possible.  Clearly mark and make sure that you can see:  Any grab bars or handrails.  First and last steps.  Where the edge of each step is.  Use tools that help you move around (mobility aids) if they are needed. These include:  Canes.  Walkers.  Scooters.  Crutches.  Turn on the lights when you go into a dark area. Replace any light bulbs as soon as they burn out.  Set up your furniture so you have a clear path. Avoid moving your furniture around.  If any of your floors are uneven, fix them.  If there are any pets around you, be aware of where they are.  Review your medicines with your doctor. Some medicines can make you feel dizzy. This can increase your chance of falling. Ask your doctor what other things that you can do to help prevent falls.   This information is not intended to replace advice given to you by your health care provider. Make sure you discuss any questions you have with your health care provider.   Document Released: 10/04/2009 Document Revised: 04/24/2015 Document Reviewed: 01/12/2015 Elsevier Interactive Patient Education 2016 [place] Maintenance, Female Adopting a healthy lifestyle and getting preventive care can go a long way to promote health and wellness. Talk with your health care provider about what schedule of regular examinations is right for you. This is a good chance for you to check in with your provider about disease prevention and staying healthy. In  between checkups, there are plenty of things you can do on your own. Experts have done a lot of research about which lifestyle changes and preventive measures are most likely to keep you healthy. Ask your health care provider for more information. WEIGHT AND DIET  Eat a healthy diet  Be sure to include plenty of vegetables,  fruits, low-fat dairy products, and lean protein.  Do not eat a lot of foods high in solid fats, added sugars, or salt.  Get regular exercise. This is one of the most important things you can do for your health.  Most adults should exercise for at least 150 minutes each week. The exercise should increase your heart rate and make you sweat (moderate-intensity exercise).  Most adults should also do strengthening exercises at least twice a week. This is in addition to the moderate-intensity exercise.  Maintain a healthy weight  Body mass index (BMI) is a measurement that can be used to identify possible weight problems. It estimates body fat based on height and weight. Your health care provider can help determine your BMI and help you achieve or maintain a healthy weight.  For females 58 years of age and older:   A BMI below 18.5 is considered underweight.  A BMI of 18.5 to 24.9 is normal.  A BMI of 25 to 29.9 is considered overweight.  A BMI of 30 and above is considered obese.  Watch levels of cholesterol and blood lipids  You should start having your blood tested for lipids and cholesterol at 80 years of age, then have this test every 5 years.  You may need to have your cholesterol levels checked more often if:  Your lipid or cholesterol levels are high.  You are older than 80 years of age.  You are at high risk for heart disease.  CANCER SCREENING   Lung Cancer  Lung cancer screening is recommended for adults 1-19 years old who are at high risk for lung cancer because of a history of smoking.  A yearly low-dose CT scan of the lungs is recommended  for people who:  Currently smoke.  Have quit within the past 15 years.  Have at least a 30-pack-year history of smoking. A pack year is smoking an average of one pack of cigarettes a day for 1 year.  Yearly screening should continue until it has been 15 years since you quit.  Yearly screening should stop if you develop a health problem that would prevent you from having lung cancer treatment.  Breast Cancer  Practice breast self-awareness. This means understanding how your breasts normally appear and feel.  It also means doing regular breast self-exams. Let your health care provider know about any changes, no matter how small.  If you are in your 20s or 30s, you should have a clinical breast exam (CBE) by a health care provider every 1-3 years as part of a regular health exam.  If you are 27 or older, have a CBE every year. Also consider having a breast X-ray (mammogram) every year.  If you have a family history of breast cancer, talk to your health care provider about genetic screening.  If you are at high risk for breast cancer, talk to your health care provider about having an MRI and a mammogram every year.  Breast cancer gene (BRCA) assessment is recommended for women who have family members with BRCA-related cancers. BRCA-related cancers include:  Breast.  Ovarian.  Tubal.  Peritoneal cancers.  Results of the assessment will determine the need for genetic counseling and BRCA1 and BRCA2 testing. Cervical Cancer Your health care provider may recommend that you be screened regularly for cancer of the pelvic organs (ovaries, uterus, and vagina). This screening involves a pelvic examination, including checking for microscopic changes to the surface of your cervix (Pap test). You may be encouraged to  have this screening done every 3 years, beginning at age 39.  For women ages 16-65, health care providers may recommend pelvic exams and Pap testing every 3 years, or they may  recommend the Pap and pelvic exam, combined with testing for human papilloma virus (HPV), every 5 years. Some types of HPV increase your risk of cervical cancer. Testing for HPV may also be done on women of any age with unclear Pap test results.  Other health care providers may not recommend any screening for nonpregnant women who are considered low risk for pelvic cancer and who do not have symptoms. Ask your health care provider if a screening pelvic exam is right for you.  If you have had past treatment for cervical cancer or a condition that could lead to cancer, you need Pap tests and screening for cancer for at least 20 years after your treatment. If Pap tests have been discontinued, your risk factors (such as having a new sexual partner) need to be reassessed to determine if screening should resume. Some women have medical problems that increase the chance of getting cervical cancer. In these cases, your health care provider may recommend more frequent screening and Pap tests. Colorectal Cancer  This type of cancer can be detected and often prevented.  Routine colorectal cancer screening usually begins at 80 years of age and continues through 80 years of age.  Your health care provider may recommend screening at an earlier age if you have risk factors for colon cancer.  Your health care provider may also recommend using home test kits to check for hidden blood in the stool.  A small camera at the end of a tube can be used to examine your colon directly (sigmoidoscopy or colonoscopy). This is done to check for the earliest forms of colorectal cancer.  Routine screening usually begins at age 31.  Direct examination of the colon should be repeated every 5-10 years through 80 years of age. However, you may need to be screened more often if early forms of precancerous polyps or small growths are found. Skin Cancer  Check your skin from head to toe regularly.  Tell your health care provider  about any new moles or changes in moles, especially if there is a change in a mole's shape or color.  Also tell your health care provider if you have a mole that is larger than the size of a pencil eraser.  Always use sunscreen. Apply sunscreen liberally and repeatedly throughout the day.  Protect yourself by wearing long sleeves, pants, a wide-brimmed hat, and sunglasses whenever you are outside. HEART DISEASE, DIABETES, AND HIGH BLOOD PRESSURE   High blood pressure causes heart disease and increases the risk of stroke. High blood pressure is more likely to develop in:  People who have blood pressure in the high end of the normal range (130-139/85-89 mm Hg).  People who are overweight or obese.  People who are African American.  If you are 1-67 years of age, have your blood pressure checked every 3-5 years. If you are 82 years of age or older, have your blood pressure checked every year. You should have your blood pressure measured twice--once when you are at a hospital or clinic, and once when you are not at a hospital or clinic. Record the average of the two measurements. To check your blood pressure when you are not at a hospital or clinic, you can use:  An automated blood pressure machine at a pharmacy.  A home  blood pressure monitor.  If you are between 28 years and 21 years old, ask your health care provider if you should take aspirin to prevent strokes.  Have regular diabetes screenings. This involves taking a blood sample to check your fasting blood sugar level.  If you are at a normal weight and have a low risk for diabetes, have this test once every three years after 80 years of age.  If you are overweight and have a high risk for diabetes, consider being tested at a younger age or more often. PREVENTING INFECTION  Hepatitis B  If you have a higher risk for hepatitis B, you should be screened for this virus. You are considered at high risk for hepatitis B if:  You were  born in a country where hepatitis B is common. Ask your health care provider which countries are considered high risk.  Your parents were born in a high-risk country, and you have not been immunized against hepatitis B (hepatitis B vaccine).  You have HIV or AIDS.  You use needles to inject street drugs.  You live with someone who has hepatitis B.  You have had sex with someone who has hepatitis B.  You get hemodialysis treatment.  You take certain medicines for conditions, including cancer, organ transplantation, and autoimmune conditions. Hepatitis C  Blood testing is recommended for:  Everyone born from [birth year] through 1963.  Anyone with known risk factors for hepatitis C. Sexually transmitted infections (STIs)  You should be screened for sexually transmitted infections (STIs) including gonorrhea and chlamydia if:  You are sexually active and are younger than 80 years of age.  You are older than 80 years of age and your health care provider tells you that you are at risk for this type of infection.  Your sexual activity has changed since you were last screened and you are at an increased risk for chlamydia or gonorrhea. Ask your health care provider if you are at risk.  If you do not have HIV, but are at risk, it may be recommended that you take a prescription medicine daily to prevent HIV infection. This is called pre-exposure prophylaxis (PrEP). You are considered at risk if:  You are sexually active and do not regularly use condoms or know the HIV status of your partner(s).  You take drugs by injection.  You are sexually active with a partner who has HIV. Talk with your health care provider about whether you are at high risk of being infected with HIV. If you choose to begin PrEP, you should first be tested for HIV. You should then be tested every 3 months for as long as you are taking PrEP.  PREGNANCY   If you are premenopausal and you may become pregnant, ask your  health care provider about preconception counseling.  If you may become pregnant, take 400 to 800 micrograms (mcg) of folic acid every day.  If you want to prevent pregnancy, talk to your health care provider about birth control (contraception). OSTEOPOROSIS AND MENOPAUSE   Osteoporosis is a disease in which the bones lose minerals and strength with aging. This can result in serious bone fractures. Your risk for osteoporosis can be identified using a bone density scan.  If you are 45 years of age or older, or if you are at risk for osteoporosis and fractures, ask your health care provider if you should be screened.  Ask your health care provider whether you should take a calcium or vitamin D supplement to  lower your risk for osteoporosis.  Menopause may have certain physical symptoms and risks.  Hormone replacement therapy may reduce some of these symptoms and risks. Talk to your health care provider about whether hormone replacement therapy is right for you.  HOME CARE INSTRUCTIONS   Schedule regular health, dental, and eye exams.  Stay current with your immunizations.   Do not use any tobacco products including cigarettes, chewing tobacco, or electronic cigarettes.  If you are pregnant, do not drink alcohol.  If you are breastfeeding, limit how much and how often you drink alcohol.  Limit alcohol intake to no more than 1 drink per day for nonpregnant women. One drink equals 12 ounces of beer, 5 ounces of wine, or 1 ounces of hard liquor.  Do not use street drugs.  Do not share needles.  Ask your health care provider for help if you need support or information about quitting drugs.  Tell your health care provider if you often feel depressed.  Tell your health care provider if you have ever been abused or do not feel safe at home.   This information is not intended to replace advice given to you by your health care provider. Make sure you discuss any questions you have with  your health care provider.   Document Released: 06/23/2011 Document Revised: 12/29/2014 Document Reviewed: 11/09/2013 Elsevier Interactive Patient Education 2016 [place] A mammogram is an X-ray of the breasts that is done to check for changes that are not normal. This test can screen for and find any changes that may suggest breast cancer. This test can also help to find other changes and variations in the breast. BEFORE THE PROCEDURE  Have this test done about 1-2 weeks after your period. This is usually when your breasts are the least tender.  If you are visiting a new doctor or clinic, send any past mammogram images to your new doctor's office.  Wash your breasts and under your arms the day of the test.  Do not use deodorants, perfumes, lotions, or powders on the day of the test.  Take off any jewelry from your neck.  Wear clothes that you can change into and out of easily. PROCEDURE  You will undress from the waist up. You will put on a gown.  You will stand in front of the X-ray machine.  Each breast will be placed between two plastic or glass plates. The plates will press down on your breast for a few seconds. Try to stay as relaxed as possible. This does not cause any harm to your breasts. Any discomfort you feel will be very brief.  X-rays will be taken from different angles of each breast. The procedure may vary among doctors and hospitals. AFTER THE PROCEDURE  The mammogram will be looked at by a specialist (radiologist).  You may need to do certain parts of the test again. This depends on the quality of the images.  Ask when your test results will be ready. Make sure you get your test results.  You may go back to your normal activities.   This information is not intended to replace advice given to you by your health care provider. Make sure you discuss any questions you have with your health care provider.   Document Released: 03/01/2012  Document Revised: 08/29/2015 Document Reviewed: 02/16/2015 Elsevier Interactive Patient Education [employer].

## 2016-10-22 LAB — TETANUS VACCINE IM

## 2016-10-28 ENCOUNTER — Other Ambulatory Visit: Payer: Self-pay

## 2016-10-28 ENCOUNTER — Telehealth: Payer: Self-pay | Admitting: Family Medicine

## 2016-10-28 MED ORDER — NEBIVOLOL HCL 5 MG PO TABS: 5.0000 mg | ORAL_TABLET | Freq: Every day | ORAL | 5 refills | Status: DC

## 2016-10-28 NOTE — Telephone Encounter (Signed)
Pt wanted to let you know that her Rx atenolol is no longer available and would like to know what you wanted her to do.  Pt state you can give her a call if needed.

## 2016-10-28 NOTE — Telephone Encounter (Signed)
Sent to the pharmacy as requested 

## 2016-10-28 NOTE — Telephone Encounter (Signed)
May send in nebivolol 5mg  daily  #30 with 5 refills- can change back to atenolol when runs out

## 2016-11-05 ENCOUNTER — Telehealth: Payer: Self-pay | Admitting: Family Medicine

## 2016-11-05 NOTE — Telephone Encounter (Signed)
Error/njr °

## 2016-11-27 ENCOUNTER — Other Ambulatory Visit: Payer: Self-pay | Admitting: Family Medicine

## 2016-11-27 DIAGNOSIS — E785 Hyperlipidemia, unspecified: Secondary | ICD-10-CM

## 2016-11-27 DIAGNOSIS — I25118 Atherosclerotic heart disease of native coronary artery with other forms of angina pectoris: Secondary | ICD-10-CM

## 2016-11-27 DIAGNOSIS — I10 Essential (primary) hypertension: Secondary | ICD-10-CM

## 2017-02-17 ENCOUNTER — Ambulatory Visit (INDEPENDENT_AMBULATORY_CARE_PROVIDER_SITE_OTHER): Payer: Medicare Other | Admitting: Family Medicine

## 2017-02-17 ENCOUNTER — Encounter: Payer: Self-pay | Admitting: Family Medicine

## 2017-02-17 DIAGNOSIS — R17 Unspecified jaundice: Secondary | ICD-10-CM | POA: Insufficient documentation

## 2017-02-17 DIAGNOSIS — K580 Irritable bowel syndrome with diarrhea: Secondary | ICD-10-CM | POA: Diagnosis not present

## 2017-02-17 DIAGNOSIS — I10 Essential (primary) hypertension: Secondary | ICD-10-CM

## 2017-02-17 DIAGNOSIS — F4322 Adjustment disorder with anxiety: Secondary | ICD-10-CM | POA: Diagnosis not present

## 2017-02-17 DIAGNOSIS — E034 Atrophy of thyroid (acquired): Secondary | ICD-10-CM | POA: Diagnosis not present

## 2017-02-17 DIAGNOSIS — E785 Hyperlipidemia, unspecified: Secondary | ICD-10-CM

## 2017-02-17 DIAGNOSIS — I2510 Atherosclerotic heart disease of native coronary artery without angina pectoris: Secondary | ICD-10-CM

## 2017-02-17 LAB — COMPREHENSIVE METABOLIC PANEL
ALT: 18 U/L (ref 0–35)
AST: 21 U/L (ref 0–37)
Albumin: 4.4 g/dL (ref 3.5–5.2)
Alkaline Phosphatase: 41 U/L (ref 39–117)
BILIRUBIN TOTAL: 1.8 mg/dL — AB (ref 0.2–1.2)
BUN: 10 mg/dL (ref 6–23)
CO2: 34 meq/L — AB (ref 19–32)
CREATININE: 0.6 mg/dL (ref 0.40–1.20)
Calcium: 10.3 mg/dL (ref 8.4–10.5)
Chloride: 99 mEq/L (ref 96–112)
GFR: 101.2 mL/min (ref >60.00)
Glucose, Bld: 97 mg/dL (ref 70–99)
Potassium: 3.9 mEq/L (ref 3.5–5.1)
Sodium: 141 mEq/L (ref 135–145)
Total Protein: 7.2 g/dL (ref 6.0–8.3)

## 2017-02-17 LAB — CBC WITH DIFFERENTIAL/PLATELET
Basophils Absolute: 0 10*3/uL (ref 0.0–0.1)
Basophils Relative: 0.6 % (ref 0.0–3.0)
Eosinophils Absolute: 0.1 10*3/uL (ref 0.0–0.7)
Eosinophils Relative: 1.1 % (ref 0.0–5.0)
HCT: 45.2 % (ref 36.0–46.0)
Hemoglobin: 15.4 g/dL — ABNORMAL HIGH (ref 12.0–15.0)
Lymphocytes Relative: 28.2 % (ref 12.0–46.0)
Lymphs Abs: 1.8 10*3/uL (ref 0.7–4.0)
MCHC: 34 g/dL (ref 30.0–36.0)
MCV: 88.8 fl (ref 78.0–100.0)
Monocytes Absolute: 0.5 10*3/uL (ref 0.1–1.0)
Monocytes Relative: 7.4 % (ref 3.0–12.0)
Neutro Abs: 4.1 10*3/uL (ref 1.4–7.7)
Neutrophils Relative %: 62.7 % (ref 43.0–77.0)
Platelets: 237 10*3/uL (ref 150.0–400.0)
RBC: 5.09 Mil/uL (ref 3.87–5.11)
RDW: 13.7 % (ref 11.5–15.5)
WBC: 6.5 10*3/uL (ref 4.0–10.5)

## 2017-02-17 LAB — LDL CHOLESTEROL, DIRECT: Direct LDL: 45 mg/dL

## 2017-02-17 LAB — TSH: TSH: 2.16 u[IU]/mL (ref 0.35–4.50)

## 2017-02-17 NOTE — Assessment & Plan Note (Signed)
S: well controlled on praluent with LDL under 70. No myalgias.  A/P: update LDL today

## 2017-02-17 NOTE — Progress Notes (Signed)
Pre visit review using our clinic review tool, if applicable. No additional management support is needed unless otherwise documented below in the visit note. 

## 2017-02-17 NOTE — Progress Notes (Signed)
Subjective:  Joyce [name] is a 83 y.o. year old very pleasant female patient who presents for/with See problem oriented charting ROS-  Occasional hot flashes at night. No night sweats. No unintentional weight loss. No chest pain. Winded at timeswith activity but other times no issues at all. Toe pain as noted below   Past Medical History-  Patient Active Problem List   Diagnosis Date Noted  . History of stroke     Priority: High  . History of transient ischemic attack (TIA) 05/16/2015    Priority: High  . CAD (coronary artery disease) 04/05/2008    Priority: High  . Hyperglycemia 08/28/2015    Priority: Medium  . Overactive bladder 08/14/2014    Priority: Medium  . Lumbar pain with radiation down left leg 02/21/2011    Priority: Medium  . Essential hypertension 01/17/2010    Priority: Medium  . Hyperlipidemia 01/15/2009    Priority: Medium  . ADJUSTMENT DISORDER WITH ANXIOUS MOOD 01/15/2009    Priority: Medium  . Hypothyroidism 04/05/2008    Priority: Medium  . IBS (irritable bowel syndrome) 04/05/2008    Priority: Medium  . CERUMEN IMPACTION, BILATERAL 11/27/2010    Priority: Low  . GERD 04/05/2008    Priority: Low  . INTERNAL HEMORRHOIDS 06/29/2002    Priority: Low  . High bilirubin 02/17/2017  . Intracranial vascular stenosis 07/02/2015    Medications- reviewed and updated Current Outpatient Prescriptions  Medication Sig Dispense Refill  . alendronate (FOSAMAX) 70 MG tablet Take 1 tablet (70 mg total) by mouth every 7 (seven) days. Take with a full glass of water on an empty stomach. 4 tablet 11  . Alirocumab (PRALUENT) 75 MG/ML SOPN Inject 75 mg into the skin every 14 (fourteen) days. 2 pen 11  . amLODipine (NORVASC) 5 MG tablet TAKE 1 TABLET BY MOUTH ONCE DAILY 90 tablet 1  . atenolol (TENORMIN) 25 MG tablet Take 1 tablet (25 mg total) by mouth 2 (two) times daily. Please call and schedule a one year follow up appointment 180 tablet 0  . Biotin 1000 MCG tablet  Take 1,000 mcg by mouth daily.    . Calcium Carb-Cholecalciferol (CALCIUM-VITAMIN D3) 600-400 MG-UNIT TABS Take 1 tablet by mouth daily.    . clopidogrel (PLAVIX) 75 MG tablet TAKE 1 TABLET BY MOUTH  DAILY 90 tablet 1  . CRANBERRY CONCENTRATE PO Take 1 tablet by mouth daily.    . Lactobacillus (ACIDOPHILUS) 10 MG CAPS Take 1 capsule by mouth daily.     Marland Kitchen LORazepam (ATIVAN) 0.5 MG tablet Take 0.5 tablets (0.25 mg total) by mouth at bedtime as needed for anxiety. 45 tablet 1  . nebivolol (BYSTOLIC) 5 MG tablet Take 1 tablet (5 mg total) by mouth daily. 30 tablet 5  . nitroGLYCERIN (NITROSTAT) 0.4 MG SL tablet Place 0.4 mg under the tongue every 5 (five) minutes as needed for chest pain. Reported on 03/24/2016    . Omega-3 Fatty Acids (FISH OIL) 1000 MG CAPS Take 1 capsule by mouth daily.    Marland Kitchen oxybutynin (DITROPAN) 5 MG tablet Take 5 mg by mouth 2 (two) times daily.     . Probiotic Product (ALIGN PO) Take 1 capsule by mouth daily.    Marland Kitchen pyridOXINE (B-6) 50 MG tablet Take 50 mg by mouth daily.    . ranitidine (ZANTAC) 150 MG capsule Take 150 mg by mouth 2 (two) times daily.    Marland Kitchen SYNTHROID 100 MCG tablet Take 1 tablet by mouth  daily 90 tablet  3  . vitamin B-12 (CYANOCOBALAMIN) 500 MCG tablet Take 500 mcg by mouth daily.     . vitamin E 400 UNIT capsule Take 400 Units by mouth daily.     No current facility-administered medications for this visit.     Objective: BP 132/78   Pulse (!) 56   Temp 97.8 F (36.6 C) (Oral)   Ht 4\' 11"  (1.499 m)   Wt 144 lb 3.2 oz (65.4 kg)   LMP  (LMP Unknown)   SpO2 96%   BMI 29.12 kg/m  Gen: NAD, resting comfortably, appears stated age CV: mildly bradycardic but regular rhythm Lungs: CTAB no crackles, wheeze, rhonchi Abdomen: soft/nontender/nondistended/normal bowel sounds. No rebound or guarding.  Ext: no edema Skin: warm, dry, no rash  Assessment/Plan:  Essential hypertension S: controlled on atenolol 25mg  BID (on nebivolol 5mg  due to shortage- about  to start as has not run out of atenolol yet) and amlodipine 5mg .  BP Readings from Last 3 Encounters:  02/17/17 132/78  10/17/16 126/60  10/14/16 140/60  A/P:Continue current meds:  With history of CVA would prefer under 130 if possible, but definitely want under 150. Improved on repeat  Hyperlipidemia S: well controlled on praluent with LDL under 70. No myalgias.  A/P: update LDL today  Hypothyroidism S: asymptomatic. Compliant with synthroid 100mcg. Some hot flashes, some loose stools- need to make sure thyroid still controlled Lab Results  Component Value Date   TSH 2.07 08/14/2016  A/P: update tsh today  ADJUSTMENT DISORDER WITH ANXIOUS MOOD Using very sparing ativan for when anxiety high. Also notes. feeling lonelier at home. Sometimes lonelier in crowd. No SI.  We discussed my concern for depression as cause of symptoms. She agrees this is possible. Discussed doing behavioral health visit for meds- she decline sboth. Wants to watch for now. Agrees to 3 month follow up in 6th and to contact us if has worsening symptoms or absolutely if thoughs of hurting herself.   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 And gets worked up with tasks she has at home living alone, with activities, and helping others.  With loose stools at times with known IBS- we discussed stress reduction may help  Mild to moderate toe pain twice a month. No meds tried. Ice 10 mins 3x a day. Or icy hot or vicks vaporub. ? Gout but would guess would be more severe  Return in about 3 months (around 05/17/2017).  Orders Placed This Encounter  Procedures  . CBC with Differential/Platelet  . Comprehensive metabolic panel    [place]  . TSH    [place]  . LDL cholesterol, direct    [place]  . Ambulatory referral to Cardiology    Referral Priority:   Routine    Referral Type:   Consultation    Referral Reason:   Specialty Services Required    Requested Specialty:    Cardiology    Number of Visits Requested:   1    Meds ordered this encounter  Medications  . Calcium Carb-Cholecalciferol (CALCIUM-VITAMIN D3) 600-400 MG-UNIT TABS    Sig: Take 1 tablet by mouth daily.    Return precautions advised.  Tana ConchStephen Hunter, MD

## 2017-02-17 NOTE — Assessment & Plan Note (Signed)
S: controlled on atenolol 25mg  BID (on nebivolol 5mg  due to shortage- about to start as has not run out of atenolol yet) and amlodipine 5mg .  BP Readings from Last 3 Encounters:  02/17/17 132/78  10/17/16 126/60  10/14/16 140/60  A/P:Continue current meds:  With history of CVA would prefer under 130 if possible, but definitely want under 150. Improved on repeat

## 2017-02-17 NOTE — Assessment & Plan Note (Signed)
S: asymptomatic. Compliant with synthroid 100mcg. Some hot flashes, some loose stools- need to make sure thyroid still controlled Lab Results  Component Value Date   TSH 2.07 08/14/2016  A/P: update tsh today

## 2017-02-17 NOTE — Assessment & Plan Note (Signed)
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 And gets worked up with tasks she has at home living alone, with activities, and helping others.  With loose stools at times with known IBS- we discussed stress reduction may help

## 2017-02-17 NOTE — Patient Instructions (Addendum)
We will call you within a week or two about your referral to cardiology. If you do not hear within 3 weeks, give us a call.   Please stop by lab before you go

## 2017-02-17 NOTE — Assessment & Plan Note (Signed)
Using very sparing ativan for when anxiety high. Also notes. feeling lonelier at home. Sometimes lonelier in crowd. No SI.  We discussed my concern for depression as cause of symptoms. She agrees this is possible. Discussed doing behavioral health visit for meds- she decline sboth. Wants to watch for now. Agrees to 3 month follow up in 6th and to contact us if has worsening symptoms or absolutely if thoughs of hurting herself.

## 2017-02-17 NOTE — Assessment & Plan Note (Signed)
Requests referral back to cardiology for her annual check in. Does have some SOB with activity but not worsening and no chest pain. She will monitor this and seek care if worsening. For now continue plavix and praluent

## 2017-03-19 ENCOUNTER — Other Ambulatory Visit: Payer: Self-pay | Admitting: Family Medicine

## 2017-03-23 ENCOUNTER — Encounter: Payer: Self-pay | Admitting: Cardiovascular Disease

## 2017-03-31 ENCOUNTER — Ambulatory Visit: Payer: Medicare Other | Admitting: Neurology

## 2017-04-01 ENCOUNTER — Ambulatory Visit: Payer: Medicare Other | Admitting: Neurology

## 2017-04-06 ENCOUNTER — Ambulatory Visit (INDEPENDENT_AMBULATORY_CARE_PROVIDER_SITE_OTHER): Payer: Medicare Other | Admitting: Cardiovascular Disease

## 2017-04-06 ENCOUNTER — Encounter: Payer: Self-pay | Admitting: Cardiovascular Disease

## 2017-04-06 VITALS — BP 140/50 | HR 52 | Ht 59.0 in | Wt 142.4 lb

## 2017-04-06 DIAGNOSIS — E785 Hyperlipidemia, unspecified: Secondary | ICD-10-CM | POA: Diagnosis not present

## 2017-04-06 DIAGNOSIS — I10 Essential (primary) hypertension: Secondary | ICD-10-CM

## 2017-04-06 DIAGNOSIS — I25119 Atherosclerotic heart disease of native coronary artery with unspecified angina pectoris: Secondary | ICD-10-CM | POA: Diagnosis not present

## 2017-04-06 NOTE — Progress Notes (Signed)
Cardiology Office Note Date:  04/06/2017   ID:  Joyce [name], DOB Dec 20, 1931, MRN [id number]  PCP:  Joyce Conch, MD  Cardiologist:  Tonny Bollman, MD    Chief Complaint  Patient presents with  . Coronary Artery Disease     History of Present Illness: Joyce [name] is a 83 y.o. female who presents for  follow-up of CAD, hypertension, and stroke. She underwent remote PCI of the left circumflex with stenting back in 2002. She was hospitalized in December 2014 with sudden onset of right leg weakness. She was diagnosed with an ischemic infarct.  Here alone today. Reports lots of family stress. No change in chest pattern, occurs with emotional stress. Describes left sided discomfort, only on rare occasion. Reports progressive DOE. No orthopnea or PND. no cough or wheezing. Primarily limited by back problems. No other complaints.   Past Medical History:  Diagnosis Date  . Adjustment disorder with anxiety STRESS CHEST DISCOMFORT   PT RECENTLY PUT HUSBAND IN NURSING HOME  . Borderline glaucoma   . Candidiasis of skin and nails   . Coronary atherosclerosis of unspecified type of vessel, native or graft CARDIOLOGIST- [facility]   DENIES S & S  . GERD (gastroesophageal reflux disease)   . History of sleep apnea YRS AGO ON CPAP UNTIL LOST WT  . Hyperlipidemia   . Hypothyroidism   . IBS (irritable bowel syndrome)   . Internal hemorrhoids without mention of complication   . Lesion of bladder   . Nocturia   . Numbness and tingling of foot LEFT -- SECONDARY TO PINCHED LUMBAR NERVE  . Pinched nerve LUMBAR   RESIDUAL LEFT FOOT NUMBNESS/ TINGLING  . Scoliosis   . Sinusitis 09/29/2013  . Stress incontinence   . Unspecified essential hypertension     Past Surgical History:  Procedure Laterality Date  . CARDIAC CATHETERIZATION  01-10-2003   DR Chrissie Noa DOWNEY   PATENT CIRCUMFLEX STENT/ MODERATE CAD ELSEWHERE  . CARDIOVASCULAR STRESS TEST  07-27-2012   NORMAL NUCLEAR STUDY/ LVEF 81%    . CATARACT EXTRACTION W/ INTRAOCULAR LENS  IMPLANT, BILATERAL    . CHOLECYSTECTOMY  1976  . CORONARY ANGIOPLASTY WITH STENT PLACEMENT  08-31-2001   DR Smitty Cords BRODIE   STENTING OF PROXIMAL CIRCUMFLEX (75% TO 0%)   . CYSTOSCOPY WITH BIOPSY  08/02/2012   Procedure: CYSTOSCOPY WITH BIOPSY;  Surgeon: Milford Cage, MD;  [facility];  Service: Urology;  Laterality: N/A;  . VAGINAL HYSTERECTOMY  1968    Current Outpatient Prescriptions  Medication Sig Dispense Refill  . alendronate (FOSAMAX) 70 MG tablet Take 1 tablet (70 mg total) by mouth every 7 (seven) days. Take with a full glass of water on an empty stomach. 4 tablet 11  . Alirocumab (PRALUENT) 75 MG/ML SOPN Inject 75 mg into the skin every 14 (fourteen) days. 2 pen 11  . amLODipine (NORVASC) 5 MG tablet TAKE 1 TABLET BY MOUTH ONCE DAILY 90 tablet 1  . atenolol (TENORMIN) 25 MG tablet Take 1 tablet (25 mg total) by mouth 2 (two) times daily. Please call and schedule a one year follow up appointment 180 tablet 0  . Biotin 1000 MCG tablet Take 1,000 mcg by mouth daily.    . Calcium Carb-Cholecalciferol (CALCIUM-VITAMIN D3) 600-400 MG-UNIT TABS Take 1 tablet by mouth daily.    . clopidogrel (PLAVIX) 75 MG tablet TAKE 1 TABLET BY MOUTH  DAILY 90 tablet 1  . CRANBERRY CONCENTRATE PO Take 1 tablet by  mouth daily.    . Lactobacillus (ACIDOPHILUS) 10 MG CAPS Take 1 capsule by mouth daily.     Marland Kitchen LORazepam (ATIVAN) 0.5 MG tablet Take 0.5 tablets (0.25 mg total) by mouth at bedtime as needed for anxiety. 45 tablet 1  . nebivolol (BYSTOLIC) 5 MG tablet Take 1 tablet (5 mg total) by mouth daily. 30 tablet 5  . nitroGLYCERIN (NITROSTAT) 0.4 MG SL tablet Place 0.4 mg under the tongue every 5 (five) minutes as needed for chest pain. Reported on 03/24/2016    . Omega-3 Fatty Acids (FISH OIL) 1000 MG CAPS Take 1 capsule by mouth daily.    Marland Kitchen oxybutynin (DITROPAN) 5 MG tablet Take 5 mg by mouth 2 (two) times daily.     . Probiotic  Product (ALIGN PO) Take 1 capsule by mouth daily.    Marland Kitchen pyridOXINE (B-6) 50 MG tablet Take 50 mg by mouth daily.    . ranitidine (ZANTAC) 150 MG capsule Take 150 mg by mouth 2 (two) times daily.    Marland Kitchen SYNTHROID 100 MCG tablet TAKE 1 TABLET BY MOUTH  DAILY 90 tablet 3  . vitamin B-12 (CYANOCOBALAMIN) 500 MCG tablet Take 500 mcg by mouth daily.     . vitamin E 400 UNIT capsule Take 400 Units by mouth daily.     No current facility-administered medications for this visit.     Allergies:   Loratadine; Adhesive [tape]; Aspirin; Levaquin [levofloxacin in d5w]; Levofloxacin; Lumigan [bimatoprost]; Mobic [meloxicam]; Penicillins; and Statins   Social History:  The patient  reports that she quit smoking about 33 years ago. Her smoking use included Cigarettes. She has a 30.00 pack-year smoking history. She has never used smokeless tobacco. She reports that she does not drink alcohol or use drugs.   Family History:  The patient's  family history includes CAD in her father; Heart disease in her mother.    ROS:  Please see the history of present illness.  Otherwise, review of systems is positive for back pain, shortness of breath, memory loss.  All other systems are reviewed and negative.    PHYSICAL EXAM: VS:  BP (!) 140/50   Pulse (!) 52   Ht  (1.499 m)   Wt 142 lb 6.4 oz (64.6 kg)   LMP  (LMP Unknown)   BMI 28.76 kg/m  , BMI Body mass index is 28.76 kg/m. GEN: Well nourished, well developed, pleasant elderly woman in no acute distress  HEENT: normal  Neck: no JVD, no masses. No carotid bruits Cardiac: RRR without murmur or gallop                Respiratory:  clear to auscultation bilaterally, normal work of breathing GI: soft, nontender, nondistended, + BS MS: no deformity or atrophy  Ext: no pretibial edema, pedal pulses 2+= bilaterally Skin: warm and dry, no rash Neuro:  Strength and sensation are intact Psych: euthymic mood, full affect  EKG:  EKG is ordered today. The ekg  ordered today shows sinus bradycardia 52 bpm, left axis deviation, age-indeterminate septal infarct, T wave abnormality consider lateral ischemia. No change from previous.  Recent Labs: 02/17/2017: ALT 18; BUN 10; Creatinine, Ser 0.60; Hemoglobin 15.4; Platelets 237.0; Potassium 3.9; Sodium 141; TSH 2.16   Lipid Panel     Component Value Date/Time   CHOL 112 (L) 11/14/2015 0801   TRIG 64 11/14/2015 0801   HDL 56 11/14/2015 0801   CHOLHDL 2.0 11/14/2015 0801   VLDL 13 11/14/2015 0801   LDLCALC  43 11/14/2015 0801   LDLDIRECT 45.0 02/17/2017 1206      Wt Readings from Last 3 Encounters:  04/06/17 142 lb 6.4 oz (64.6 kg)  02/17/17 144 lb 3.2 oz (65.4 kg)  10/17/16 145 lb 2 oz (65.8 kg)     Cardiac Studies Reviewed: 2D Echo 05-17-2015: Study Conclusions  - Left ventricle: The cavity size was normal. Wall thickness was   normal. The estimated ejection fraction was 60%. Wall motion was   normal; there were no regional wall motion abnormalities. - Right ventricle: The cavity size was normal. Systolic function   was normal. - Impressions: No cardiac source of embolism was identified, but   cannot be ruled out on the basis of this examination.  Impressions:  - No cardiac source of embolism was identified, but cannot be ruled   out on the basis of this examination.   ASSESSMENT AND PLAN: 1.  CAD, native vessel, with angina: symptoms controlled on current Rx. Medications reviewed and no changes recommended.   2. Hx stroke: continue plavix  3. Hyperlipidemia: statin intolerant. LDL 45 on alirocumab.  4. HTN: BP controlled on multidrug Rx. Most recent labs reviewed.   Current medicines are reviewed with the patient today.  The patient does not have concerns regarding medicines.  Labs/ tests ordered today include:  No orders of the defined types were placed in this encounter.   Disposition:   FU one year  Signed, Tonny Bollman, MD  04/06/2017 11:50 AM    [facility] [address] [place], [place], [place]  [address] Phone: [phone]; Fax: [phone]

## 2017-04-06 NOTE — Patient Instructions (Signed)

## 2017-04-14 ENCOUNTER — Telehealth: Payer: Self-pay | Admitting: Family Medicine

## 2017-04-14 ENCOUNTER — Telehealth: Payer: Self-pay | Admitting: Neurology

## 2017-04-14 ENCOUNTER — Other Ambulatory Visit: Payer: Self-pay | Admitting: Neurology

## 2017-04-14 DIAGNOSIS — G3184 Mild cognitive impairment, so stated: Secondary | ICD-10-CM

## 2017-04-14 NOTE — Telephone Encounter (Signed)
I spoke to patient today during her end of study visit for RESPECT [employer] trial and she mentioned she was having significant cognitive and short-term memory difficulties since her stroke. I will order memory panel labs, EEG and recommend she follow-up with me with her her daughter in 2 months

## 2017-04-14 NOTE — Telephone Encounter (Signed)
° ° °  Pt neurologist Dr Pearlean Brownie advised pt to contact her pcp and have the following labs done. If ok may we have an order please   TSH RPR EEG B12 LEVEL

## 2017-04-15 ENCOUNTER — Telehealth: Payer: Self-pay | Admitting: Neurology

## 2017-04-15 NOTE — Telephone Encounter (Signed)
See his note yesterday- appears he wants her to have orders at their office because he ordered a dementia panel. We do not order EEGs- from his note appears they will set this up.

## 2017-04-15 NOTE — Telephone Encounter (Signed)
DR.Sethi will patients labs, and EEG be done by [facility] or will research be doing it. Please advise so we can call the patient back thanks.

## 2017-04-15 NOTE — Telephone Encounter (Signed)
Called and left a voicemail message asking for a return phone call 

## 2017-04-15 NOTE — Telephone Encounter (Signed)
Her research study has finished all subsequent visits and labs will be thru her insurance

## 2017-04-15 NOTE — Telephone Encounter (Signed)
Patient called office in reference to her EEG/Labs that Dr. Pearlean Brownie has ordered (patient seen in research) Patient is confused as to where she is to have labs drawn and who is going to schedule her EEG.  Please call

## 2017-04-15 NOTE — Telephone Encounter (Signed)
° ° ° ° °  Spoke with pt and she is aware of what Dr Durene Cal said she will contact Dr Pearlean Brownie office .

## 2017-04-16 ENCOUNTER — Telehealth: Payer: Self-pay | Admitting: Neurology

## 2017-04-16 NOTE — Telephone Encounter (Signed)
Teara in phone room spoke with patients daughter. PT will get her lab work on the same day as her EEG.

## 2017-04-21 ENCOUNTER — Ambulatory Visit (INDEPENDENT_AMBULATORY_CARE_PROVIDER_SITE_OTHER): Payer: Medicare Other

## 2017-04-21 ENCOUNTER — Other Ambulatory Visit (INDEPENDENT_AMBULATORY_CARE_PROVIDER_SITE_OTHER): Payer: Self-pay

## 2017-04-21 DIAGNOSIS — G3184 Mild cognitive impairment, so stated: Secondary | ICD-10-CM

## 2017-04-21 DIAGNOSIS — R410 Disorientation, unspecified: Secondary | ICD-10-CM

## 2017-04-21 DIAGNOSIS — Z0289 Encounter for other administrative examinations: Secondary | ICD-10-CM

## 2017-04-22 LAB — DEMENTIA PANEL
HOMOCYSTEINE: 7.8 umol/L (ref 0.0–15.0)
RPR Ser Ql: NONREACTIVE
TSH: 2.94 u[IU]/mL (ref 0.450–4.500)

## 2017-04-24 IMAGING — CT CT ANGIO HEAD
1 of 11 series · 4 of 33 positions shown · IV contrast (omnipaque)
Comparison: MRI 05/16/2015

CLINICAL DATA: TIA. Hypertension, dyslipidemia. Coronary artery
disease. Sudden onset of slurred speech with right-sided numbness.

EXAM:
CT ANGIOGRAPHY HEAD AND NECK
TECHNIQUE: Multidetector CT imaging of the head and neck was performed using
the standard protocol during bolus administration of intravenous
contrast. Multiplanar CT image reconstructions and MIPs were
obtained to evaluate the vascular anatomy. Carotid stenosis
measurements (when applicable) are obtained utilizing NASCET
criteria, using the distal internal carotid diameter as the
denominator.
CONTRAST:  50mL OMNIPAQUE IOHEXOL 350 MG/ML SOLN

[Series 8: axial 1x1 · axial · 0.40mm/px · z∈[-344,-146]mm · 4 of 331 slices shown]
[im 67/331  soft-tissue]
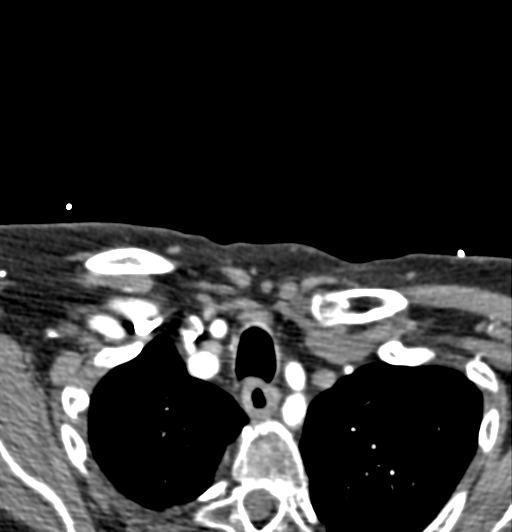
[im 133/331  bone]
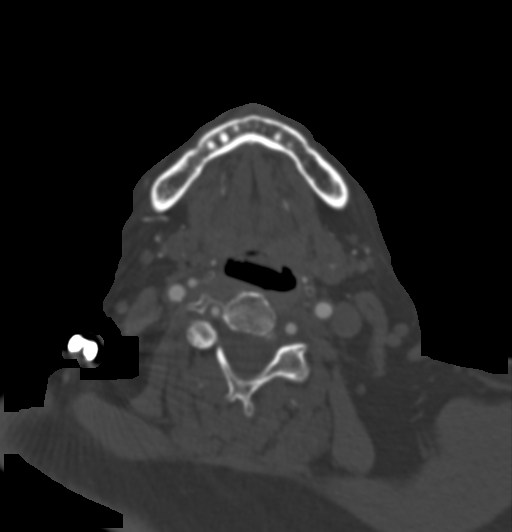
[im 199/331  soft-tissue]
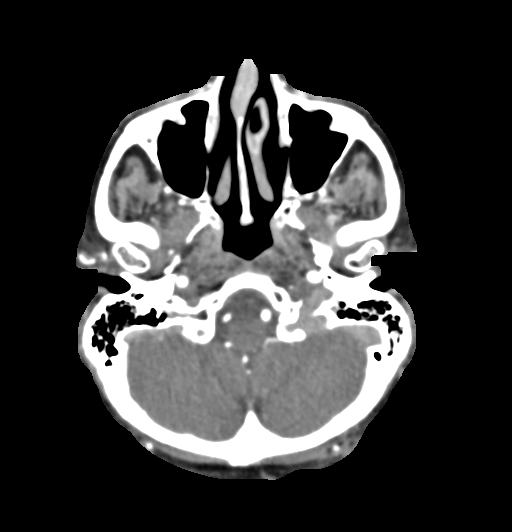
[im 265/331  bone]
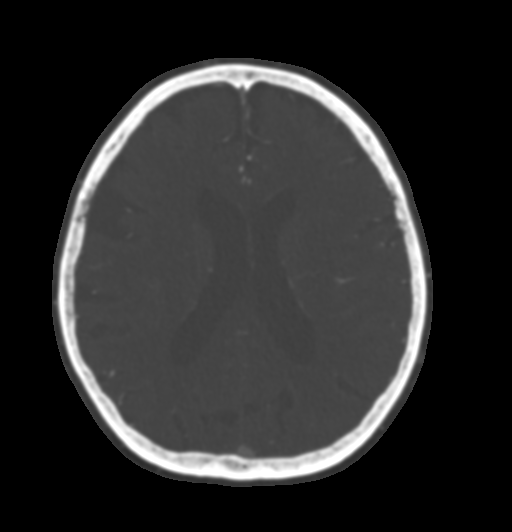

[4 of 33 positions shown; findings below may reference images not displayed]

FINDINGS: CT HEAD

Brain: Moderate atrophy. Negative for acute infarct. Negative for
hemorrhage or mass.

Calvarium and skull base: Negative

Paranasal sinuses: Negative

Orbits: Negative

CTA NECK

Aortic arch: Atherosclerotic aortic arch with extensive
calcification. Mild stenosis at the origin of the left common
carotid artery. Proximal great vessels otherwise without significant
stenosis. Apical emphysema.

Right carotid system: Atherosclerotic disease in the carotid
bifurcation with calcified plaque in the carotid bulb. No
significant carotid stenosis or dissection.

Left carotid system: Atherosclerotic calcification in the carotid
bulb without significant stenosis or dissection

Vertebral arteries:Both vertebral arteries are widely patent to the
basilar. Left vertebral dominant. Negative for atherosclerotic
disease or stenosis.

Skeleton: No focal bony lesion.

Other neck: Negative

CTA HEAD

Anterior circulation: Mild atherosclerotic calcification in the
cavernous carotid bilaterally without significant stenosis. Anterior
cerebral arteries widely patent. Right middle cerebral artery patent
without stenosis. Moderate stenosis distal left M1 segment. Left MCA
branches patent.

Posterior circulation: Both vertebral arteries patent to the
basilar. PICA patent bilaterally. Basilar widely patent. Superior
cerebellar and posterior cerebral arteries patent bilaterally

Venous sinuses: Patent

Anatomic variants: Negative for cerebral aneurysm

Delayed phase: No enhancing lesion
IMPRESSION: No significant carotid or vertebral artery stenosis in the neck.

Moderate stenosis left M1 segment. Otherwise no significant
intracranial stenosis or vascular malformation.

## 2017-04-27 NOTE — Telephone Encounter (Signed)
Pt calling requesting results of the EEG and blood work, please call when ready

## 2017-04-28 NOTE — Telephone Encounter (Signed)
I called the patient and gave her results of lab work for reversible causes of memory loss. Being normal and EEG showing only mild slowing of pain activity which is a finding expected with age. No worrisome findings. She voiced understanding.

## 2017-05-19 ENCOUNTER — Ambulatory Visit: Payer: Medicare Other | Admitting: Family Medicine

## 2017-05-19 ENCOUNTER — Other Ambulatory Visit: Payer: Self-pay | Admitting: *Deleted

## 2017-05-19 ENCOUNTER — Other Ambulatory Visit: Payer: Self-pay | Admitting: Family Medicine

## 2017-05-19 DIAGNOSIS — I25118 Atherosclerotic heart disease of native coronary artery with other forms of angina pectoris: Secondary | ICD-10-CM

## 2017-05-19 DIAGNOSIS — E785 Hyperlipidemia, unspecified: Secondary | ICD-10-CM

## 2017-05-19 DIAGNOSIS — I10 Essential (primary) hypertension: Secondary | ICD-10-CM

## 2017-05-19 MED ORDER — ATENOLOL 25 MG PO TABS: 25.0000 mg | ORAL_TABLET | Freq: Two times a day (BID) | ORAL | 3 refills | Status: DC

## 2017-05-27 ENCOUNTER — Ambulatory Visit (INDEPENDENT_AMBULATORY_CARE_PROVIDER_SITE_OTHER): Payer: Medicare Other | Admitting: Neurology

## 2017-05-27 ENCOUNTER — Telehealth: Payer: Self-pay | Admitting: Neurology

## 2017-05-27 ENCOUNTER — Encounter: Payer: Self-pay | Admitting: Neurology

## 2017-05-27 VITALS — BP 138/64 | HR 57 | Wt 141.2 lb

## 2017-05-27 DIAGNOSIS — R413 Other amnesia: Secondary | ICD-10-CM

## 2017-05-27 NOTE — Telephone Encounter (Signed)
[employer] STUDy Prescreening Visit  The patient and her daughter expressed interest in participation in the [place] [employer] early dementia study. They were given written information to review and signed the Pre screening study consent. Patient underwent cognitive battery testing and scored 90 and qualified for the study. Patient and family were given opportunity to ask questions about this study which were answered. She will return later and a mutually convenient date to undergo more study specific screening and lab procedures.  Joyce HeadyPramod Yee Joss, MD

## 2017-05-27 NOTE — Progress Notes (Signed)
STROKE NEUROLOGY FOLLOW UP NOTE  NAME: Joyce [name] DOB: [birth year]  REASON FOR VISIT: stroke follow up HISTORY FROM: pt and chart  Today we had the pleasure of seeing Joyce [name] in follow-up at our Neurology Clinic. Pt was accompanied by daughter.   History Summary [name] is a 83 y.o. female with history of acute small right frontal-parietal cortical stroke in 11/2013 and enrolled in POINT trial at that time. Later on, she was also enrolled to RESPECT ESUS trial with Dr. Pearlean Brownie. However, she was admitted on 05/16/15 for slurry speech and right sided numbness which rapidly improved and resolved. She was not given tPA. MRI showed no stroke but MRA showed left MCA M1 stenosed with more progression comparing with the MRA in 2014. Repeat CT confirmed the stenosis. In light of this progression, pt optioned out of the RESPECT ESUS trial and was put on dural antiplatelets. Her LDL still high at 160, but intolerant with statins. She was recommend to discuss with PCP for PCSK9 inhibitors.  Interval History 05/27/2017 She returns for follow-up after last visit with Dr. Roda Shutters 2 years ago. She has finished participation in the RESPECT ESUS trial on 04/18/17. She's had no recurrent stroke or TIA symptoms. She is tolerating Plavix well with only minor bruising but no bleeding episodes. Her blood pressure is under good control and today it is 138/69. She continues to have memory difficulties and short-term memory loss. She gets quite frustrated. She does write down the list but forgets where she keeps it. She does do daily solitaire and has started taking fish oil as well as provision but feels her memory loss persists. She did undergo lab work last month for reversible causes of memory loss which was normal. CT scan showed stable appearance of a previous strokes but no new strokes were noted. EEG showed mild generalized slowing without definite epileptiform activity. Patient continues to live alone but  her daughters keep a close watch on her. She can still bearable 7 manage most of her fast but the daughters help her with money transfers  REVIEW OF SYSTEMS: Full 14 system review of systems performed and notable only for those listed below and in HPI above, all others are negative:   Memory loss, difficulty completing sentences and calculations, easy bruising.. All other systems negative. The following represents the patient's updated allergies and side effects list: Allergies  Allergen Reactions  . Loratadine Other (See Comments)    Gi upset  . Adhesive [Tape] Other (See Comments)    Bruising and severe irritation  . Aspirin Other (See Comments)    Gi upset  . Levaquin [Levofloxacin In D5w] Itching  . Levofloxacin Nausea And Vomiting  . Lumigan [Bimatoprost] Other (See Comments)    Severe burning of eyes  . Mobic [Meloxicam] Nausea And Vomiting and Other (See Comments)    hallucinations  . Penicillins Diarrhea and Nausea And Vomiting  . Statins Other (See Comments)    Severe muscle pain    The neurologically relevant items on the patient's problem list were reviewed on today's visit.  Neurologic Examination  A problem focused neurological exam (12 or more points of the single system neurologic examination, vital signs counts as 1 point, cranial nerves count for 8 points) was performed.  Blood pressure 138/64, pulse (!) 57, weight 141 lb 3.2 oz (64 kg).  General - Well nourished, well developed, in no apparent distress.  Ophthalmologic - Sharp disc margins OU.   Cardiovascular -  Regular rate and rhythm with no murmur.  Mental Status -  Level of arousal and orientation to time, place, and person were intact.Diminished recall 1/3. Poor animal naming 6 only. Clock drawing 3/4. Mini-Mental status exam scored 28/30 Language including expression, naming, repetition, comprehension was assessed and found intact. Fund of Knowledge was assessed and was intact.  Cranial Nerves II -  XII - II - Visual field intact OU. III, IV, VI - Extraocular movements intact. V - Facial sensation intact bilaterally. VII - Facial movement intact bilaterally. VIII - Hearing & vestibular intact bilaterally. X - Palate elevates symmetrically. XI - Chin turning & shoulder shrug intact bilaterally. XII - Tongue protrusion intact.  Motor Strength - The patient's strength was normal in all extremities and pronator drift was absent.  Bulk was normal and fasciculations were absent.   Motor Tone - Muscle tone was assessed at the neck and appendages and was normal.  Reflexes - The patient's reflexes were 1+ in all extremities and she had no pathological reflexes.  Sensory - Light touch, temperature/pinprick, vibration and proprioception, and Romberg testing were assessed and were normal.    Coordination - The patient had normal movements in the hands and feet with no ataxia or dysmetria.  Tremor was absent.  Gait and Station - The patient's transfers, posture, gait, station, and turns were observed as normal.  Data reviewed: I personally reviewed the images and agree with the radiology interpretations.  Dg Chest 2 View 05/17/2015 1. No active cardiopulmonary disease. 2. Chronic cardiomegaly.   Ct Head Wo Contrast 05/16/2015 1. No acute intracranial abnormality. 2. Stable moderate cortical atrophy and mild deep atrophy.   Mri and Mra Brain Wo Contrast 05/16/2015  Chronic changes as described. No acute intracranial findings.  Left MCA M1 stenosis, progressed from MRA done 11/2013.   11/25/13  Acute small nonhemorrhagic infarct at the junction of the right frontal -parietal lobe. Tiny aneurysm superior aspect of the right internal carotid artery cavernous segment suspected.  2D echo  05/17/2015 Study Conclusions - Left ventricle: The cavity size was normal. Wall thickness was normal. The estimated ejection fraction was 60%. Wall motion was normal; there were no  regional wall motion abnormalities. - Right ventricle: The cavity size was normal. Systolic function was normal. - Impressions: No cardiac source of embolism was identified, but cannot be ruled out on the basis of this examination. Impressions: - No cardiac source of embolism was identified, but cannot be ruled out on the basis of this examination.  CTA head and neck - No significant carotid or vertebral artery stenosis in the neck. Moderate stenosis left M1 segment. Otherwise no significant intracranial stenosis or vascular malformation.  Component     Latest Ref Rng 11/25/2013 11/26/2013 05/17/2015  Cholesterol     0 - 200 mg/dL  161203 (H) 096231 (H)  Triglycerides     <150 mg/dL  67 84  HDL Cholesterol     >40 mg/dL  55 50  Total CHOL/HDL Ratio       3.7 4.6  VLDL     0 - 40 mg/dL  13 17  LDL (calc)     0 - 99 mg/dL  045135 (H) 409164 (H)  Hemoglobin A1C     4.8 - 5.6 % 5.6  5.7 (H)  Mean Plasma Glucose      114  117    Assessment:  83 y.o. Caucasian female with PMH of stroke 2014 on RESPECT ESUS trial, HDL, HTN ,05/16/15 for TIA.  .Marland Kitchen  History of memory loss and mild cognitive impairment since more than a year. Evaluation for reversible causes has been negative Plan:  -I had a long discussion the patient and her 2 daughters about her memory loss and mild cognitive impairment as well as remote strokes and answered questions. Continue Plavix for stroke prevention with strict control of hypertension with blood pressure goal below 130/90 and lipids with LDL cholesterol goal below 70 mg percent. Continue fish oil and prevagenfor her memory loss as well as increased participation in cognitively challenging activities like solving crossword puzzles, playing sudoku and word searches. We also discussed memory compensation strategies The patient is interested in participating in the [place] early dementia trial. Will give the patient and family information to review and decide. Greater than 50%  time during this 30 minute visit was spent on counseling and coordination of care about her memory loss, cognitive impairment, remote strokes and TIAs and answered questions She will return for follow-up in 6 months or call earlier if necessary  No orders of the defined types were placed in this encounter.   Meds ordered this encounter  Medications  . Apoaequorin (PREVAGEN PO)    Sig: Take 20 mg by mouth.    Patient Instructions  I had a long discussion the patient and her 2 daughters about her memory loss and mild cognitive impairment as well as remote strokes and answered questions. Continue Plavix for stroke prevention with strict control of hypertension with blood pressure goal below 130/90 and lipids with LDL cholesterol goal below 70 mg percent. Continue fish oil and prevagenfor her memory loss as well as increased participation in cognitively challenging activities like solving crossword puzzles, playing sudoku and word searches. We also discussed memory compensation strategies The patient is interested in participating in the [place] early dementia trial. Will give the patient and family information to review and decide. She will return for follow-up in 6 months or call earlier if necessary  Memory Compensation Strategies  1. Use "WARM" strategy.  W= write it down  A= associate it  R= repeat it  M= make a mental note  2.   You can keep a Glass blower/designer.  Use a 3-ring notebook with sections for the following: calendar, important names and phone numbers,  medications, doctors' names/phone numbers, lists/reminders, and a section to journal what you did  each day.   3.    Use a calendar to write appointments down.  4.    Write yourself a schedule for the day.  This can be placed on the calendar or in a separate section of the Memory Notebook.  Keeping a  regular schedule can help memory.  5.    Use medication organizer with sections for each day or morning/evening pills.  You  may need help loading it  6.    Keep a basket, or pegboard by the door.  Place items that you need to take out with you in the basket or on the pegboard.  You may also want to  include a message board for reminders.  7.    Use sticky notes.  Place sticky notes with reminders in a place where the task is performed.  For example: " turn off the  stove" placed by the stove, "lock the door" placed on the door at eye level, " take your medications" on  the bathroom mirror or by the place where you normally take your medications.  8.    Use alarms/timers.  Use  while cooking to remind yourself to check on food or as a reminder to take your medicine, or as a  reminder to make a call, or as a reminder to perform another task, etc.    Delia Heady, MD [facility] [address], [address] [place], [place] [address] [phone]

## 2017-05-27 NOTE — Patient Instructions (Signed)
I had a long discussion the patient and her 2 daughters about her memory loss and mild cognitive impairment as well as remote strokes and answered questions. Continue Plavix for stroke prevention with strict control of hypertension with blood pressure goal below 130/90 and lipids with LDL cholesterol goal below 70 mg percent. Continue fish oil and prevagenfor her memory loss as well as increased participation in cognitively challenging activities like solving crossword puzzles, playing sudoku and word searches. We also discussed memory compensation strategies The patient is interested in participating in the [place] early dementia trial. Will give the patient and family information to review and decide. She will return for follow-up in 6 months or call earlier if necessary  Memory Compensation Strategies  1. Use "WARM" strategy.  W= write it down  A= associate it  R= repeat it  M= make a mental note  2.   You can keep a Glass blower/designerMemory Notebook.  Use a 3-ring notebook with sections for the following: calendar, important names and phone numbers,  medications, doctors' names/phone numbers, lists/reminders, and a section to journal what you did  each day.   3.    Use a calendar to write appointments down.  4.    Write yourself a schedule for the day.  This can be placed on the calendar or in a separate section of the Memory Notebook.  Keeping a  regular schedule can help memory.  5.    Use medication organizer with sections for each day or morning/evening pills.  You may need help loading it  6.    Keep a basket, or pegboard by the door.  Place items that you need to take out with you in the basket or on the pegboard.  You may also want to  include a message board for reminders.  7.    Use sticky notes.  Place sticky notes with reminders in a place where the task is performed.  For example: " turn off the  stove" placed by the stove, "lock the door" placed on the door at eye level, " take your  medications" on  the bathroom mirror or by the place where you normally take your medications.  8.    Use alarms/timers.  Use while cooking to remind yourself to check on food or as a reminder to take your medicine, or as a  reminder to make a call, or as a reminder to perform another task, etc.

## 2017-06-23 ENCOUNTER — Other Ambulatory Visit: Payer: Self-pay | Admitting: Neurology

## 2017-06-23 DIAGNOSIS — F0280 Dementia in other diseases classified elsewhere without behavioral disturbance: Secondary | ICD-10-CM

## 2017-06-26 ENCOUNTER — Encounter: Payer: Self-pay | Admitting: Adult Health

## 2017-06-26 ENCOUNTER — Ambulatory Visit (INDEPENDENT_AMBULATORY_CARE_PROVIDER_SITE_OTHER): Payer: Medicare Other | Admitting: Adult Health

## 2017-06-26 ENCOUNTER — Telehealth: Payer: Self-pay | Admitting: Family Medicine

## 2017-06-26 VITALS — BP 142/52 | Temp 97.9°F | Ht 59.0 in | Wt 140.2 lb

## 2017-06-26 DIAGNOSIS — R300 Dysuria: Secondary | ICD-10-CM | POA: Diagnosis not present

## 2017-06-26 DIAGNOSIS — R55 Syncope and collapse: Secondary | ICD-10-CM

## 2017-06-26 LAB — BASIC METABOLIC PANEL
BUN: 11 mg/dL (ref 7–25)
CHLORIDE: 100 mmol/L (ref 98–110)
CO2: 27 mmol/L (ref 20–31)
CREATININE: 0.65 mg/dL (ref 0.60–0.88)
Calcium: 9.5 mg/dL (ref 8.6–10.4)
Glucose, Bld: 119 mg/dL — ABNORMAL HIGH (ref 65–99)
POTASSIUM: 4.1 mmol/L (ref 3.5–5.3)
Sodium: 140 mmol/L (ref 135–146)

## 2017-06-26 LAB — CBC WITH DIFFERENTIAL/PLATELET
Basophils Absolute: 0 cells/uL (ref 0–200)
Basophils Relative: 0 %
Eosinophils Absolute: 58 cells/uL (ref 15–500)
Eosinophils Relative: 1 %
HCT: 42.8 % (ref 35.0–45.0)
Hemoglobin: 14.3 g/dL (ref 11.7–15.5)
LYMPHS PCT: 23 %
Lymphs Abs: 1334 cells/uL (ref 850–3900)
MCH: 30 pg (ref 27.0–33.0)
MCHC: 33.4 g/dL (ref 32.0–36.0)
MCV: 89.9 fL (ref 80.0–100.0)
MONO ABS: 406 {cells}/uL (ref 200–950)
MONOS PCT: 7 %
MPV: 10.2 fL (ref 7.5–12.5)
NEUTROS ABS: 4002 {cells}/uL (ref 1500–7800)
Neutrophils Relative %: 69 %
PLATELETS: 234 10*3/uL (ref 140–400)
RBC: 4.76 MIL/uL (ref 3.80–5.10)
RDW: 13.5 % (ref 11.0–15.0)
WBC: 5.8 10*3/uL (ref 3.8–10.8)

## 2017-06-26 LAB — POCT URINALYSIS DIPSTICK
BILIRUBIN UA: NEGATIVE
Blood, UA: NEGATIVE
GLUCOSE UA: NEGATIVE
Ketones, UA: NEGATIVE
Leukocytes, UA: NEGATIVE
NITRITE UA: NEGATIVE
Protein, UA: NEGATIVE
Spec Grav, UA: 1.01 (ref 1.010–1.025)
UROBILINOGEN UA: 0.2 U/dL
pH, UA: 6 (ref 5.0–8.0)

## 2017-06-26 NOTE — Addendum Note (Signed)
Addended by: Bonnye FavaKWEI, Myra Weng K on: 06/26/2017 04:11 PM   Modules accepted: Orders

## 2017-06-26 NOTE — Telephone Encounter (Signed)
Pt scheduled to see Kandee KeenCory this afternoon.

## 2017-06-26 NOTE — Progress Notes (Signed)
Subjective:    Patient ID: Joyce [name], female    DOB: [birth year], 83 y.o.   MRN: [id number]  HPI  83 year old female who  has a past medical history of Adjustment disorder with anxiety (STRESS CHEST DISCOMFORT); Borderline glaucoma; Candidiasis of skin and nails; Coronary atherosclerosis of unspecified type of vessel, native or graft (CARDIOLOGIST- DR HOCHREIN); GERD (gastroesophageal reflux disease); History of sleep apnea (YRS AGO ON CPAP UNTIL LOST WT); Hyperlipidemia; Hypothyroidism; IBS (irritable bowel syndrome); Internal hemorrhoids without mention of complication; Lesion of bladder; Nocturia; Numbness and tingling of foot (LEFT -- SECONDARY TO PINCHED LUMBAR NERVE); Pinched nerve (LUMBAR); Scoliosis; Sinusitis (09/29/2013); Stress incontinence; and Unspecified essential hypertension.   She is a patient of Dr. Yong Channel who presents to the office today for an acute issue. She reports that today while at the grocery store she was standing in line and became lightheaded and faint. She reports that she did not pass out but was told by people in line that she " turned white as a ghost." This feeling lasted until she got home.   She reports drinking four glasses of liquid in the morning but does not drink water throughout the day   She denies any CP, SOB, or dizziness currently  She does report some burning with urination over the last two days   Review of Systems See HPI   Past Medical History:  Diagnosis Date  . Adjustment disorder with anxiety STRESS CHEST DISCOMFORT   PT RECENTLY PUT HUSBAND IN NURSING HOME  . Borderline glaucoma   . Candidiasis of skin and nails   . Coronary atherosclerosis of unspecified type of vessel, native or graft CARDIOLOGIST- DR [facility]   DENIES S & S  . GERD (gastroesophageal reflux disease)   . History of sleep apnea YRS AGO ON CPAP UNTIL LOST WT  . Hyperlipidemia   . Hypothyroidism   . IBS (irritable bowel syndrome)   . Internal hemorrhoids  without mention of complication   . Lesion of bladder   . Nocturia   . Numbness and tingling of foot LEFT -- SECONDARY TO PINCHED LUMBAR NERVE  . Pinched nerve LUMBAR   RESIDUAL LEFT FOOT NUMBNESS/ TINGLING  . Scoliosis   . Sinusitis 09/29/2013  . Stress incontinence   . Unspecified essential hypertension     Social History   Social History  . Marital status: Married    Spouse name: N/A  . Number of children: N/A  . Years of education: N/A   Occupational History  . retired Retired   Social History Main Topics  . Smoking status: Former Smoker    Packs/day: 1.50    Years: 20.00    Types: Cigarettes    Quit date: 07/31/1983  . Smokeless tobacco: Never Used  . Alcohol use No  . Drug use: No  . Sexual activity: No   Other Topics Concern  . Not on file   Social History Narrative   Widowed December 2014. Married just shy of 34 years. 3 kids (1 daughter that has essentially left the family x 3 yers). 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       Hobbies: baking, cooking, sewing, planning on water aerobics, active in church ([employer])    Past Surgical History:  Procedure Laterality Date  . CARDIAC CATHETERIZATION  01-10-2003   DR Gwyndolyn Saxon DOWNEY   PATENT CIRCUMFLEX STENT/ MODERATE  CAD ELSEWHERE  . CARDIOVASCULAR STRESS TEST  07-27-2012   NORMAL NUCLEAR STUDY/ LVEF 81%  . CATARACT EXTRACTION W/ INTRAOCULAR LENS  IMPLANT, BILATERAL    . CHOLECYSTECTOMY  1976  . CORONARY ANGIOPLASTY WITH STENT PLACEMENT  08-31-2001   DR Darnell Level BRODIE   STENTING OF PROXIMAL CIRCUMFLEX (75% TO 0%)   . CYSTOSCOPY WITH BIOPSY  08/02/2012   Procedure: CYSTOSCOPY WITH BIOPSY;  Surgeon: Molli Hazard, MD;  Location[facility];  Service: Urology;  Laterality: N/A;  . VAGINAL HYSTERECTOMY  1968    Family History  Problem Relation Age of Onset  . Heart disease Mother        per patient died of  old age at 35  . CAD Father        massive MI age 68    Allergies  Allergen Reactions  . Loratadine Other (See Comments)    Gi upset  . Adhesive [Tape] Other (See Comments)    Bruising and severe irritation  . Aspirin Other (See Comments)    Gi upset  . Levaquin [Levofloxacin In D5w] Itching  . Levofloxacin Nausea And Vomiting  . Lumigan [Bimatoprost] Other (See Comments)    Severe burning of eyes  . Mobic [Meloxicam] Nausea And Vomiting and Other (See Comments)    hallucinations  . Penicillins Diarrhea and Nausea And Vomiting  . Statins Other (See Comments)    Severe muscle pain    Current Outpatient Prescriptions on File Prior to Visit  Medication Sig Dispense Refill  . alendronate (FOSAMAX) 70 MG tablet Take 1 tablet (70 mg total) by mouth every 7 (seven) days. Take with a full glass of water on an empty stomach. 4 tablet 11  . Alirocumab (PRALUENT) 75 MG/ML SOPN Inject 75 mg into the skin every 14 (fourteen) days. 2 pen 11  . amLODipine (NORVASC) 5 MG tablet TAKE 1 TABLET BY MOUTH ONCE DAILY 90 tablet 1  . Apoaequorin (PREVAGEN PO) Take 20 mg by mouth.    Marland Kitchen atenolol (TENORMIN) 25 MG tablet Take 1 tablet (25 mg total) by mouth 2 (two) times daily. 180 tablet 3  . Biotin 1000 MCG tablet Take 1,000 mcg by mouth daily.    . Calcium Carb-Cholecalciferol (CALCIUM-VITAMIN D3) 600-400 MG-UNIT TABS Take 1 tablet by mouth daily.    . clopidogrel (PLAVIX) 75 MG tablet TAKE 1 TABLET BY MOUTH  DAILY 90 tablet 1  . CRANBERRY CONCENTRATE PO Take 1 tablet by mouth daily.    . Lactobacillus (ACIDOPHILUS) 10 MG CAPS Take 1 capsule by mouth daily.     Marland Kitchen LORazepam (ATIVAN) 0.5 MG tablet Take 0.5 tablets (0.25 mg total) by mouth at bedtime as needed for anxiety. 45 tablet 1  . nebivolol (BYSTOLIC) 5 MG tablet Take 1 tablet (5 mg total) by mouth daily. 30 tablet 5  . nitroGLYCERIN (NITROSTAT) 0.4 MG SL tablet Place 0.4 mg under the tongue every 5 (five) minutes as needed for chest pain. Reported  on 03/24/2016    . Omega-3 Fatty Acids (FISH OIL) 1000 MG CAPS Take 1 capsule by mouth daily.    Marland Kitchen oxybutynin (DITROPAN) 5 MG tablet Take 5 mg by mouth 2 (two) times daily.     . Probiotic Product (ALIGN PO) Take 1 capsule by mouth daily.    Marland Kitchen pyridOXINE (B-6) 50 MG tablet Take 50 mg by mouth daily.    . ranitidine (ZANTAC) 150 MG capsule Take 150 mg by mouth 2 (two) times daily.    Marland Kitchen  SYNTHROID 100 MCG tablet TAKE 1 TABLET BY MOUTH  DAILY 90 tablet 3  . vitamin B-12 (CYANOCOBALAMIN) 500 MCG tablet Take 500 mcg by mouth daily.     . vitamin E 400 UNIT capsule Take 400 Units by mouth daily.     No current facility-administered medications on file prior to visit.     BP (!) 142/52 (BP Location: Left Arm, Patient Position: Sitting, Cuff Size: Normal)   Temp 97.9 F (36.6 C) (Oral)   Ht 4' 11"  (1.499 m)   Wt 140 lb 3.2 oz (63.6 kg)   LMP  (LMP Unknown)   BMI 28.32 kg/m       Objective:   Physical Exam  Constitutional: She is oriented to person, place, and time. She appears well-developed and well-nourished. No distress.  Eyes: Conjunctivae and EOM are normal. Pupils are equal, round, and reactive to light. Right eye exhibits no discharge. Left eye exhibits no discharge. No scleral icterus.  Cardiovascular: Normal rate, regular rhythm, normal heart sounds and intact distal pulses.  Exam reveals no gallop and no friction rub.   No murmur heard. Pulmonary/Chest: Effort normal and breath sounds normal. No respiratory distress. She has no wheezes. She has no rales. She exhibits no tenderness.  Neurological: She is alert and oriented to person, place, and time.  Skin: Skin is warm and dry. No rash noted. She is not diaphoretic. No erythema. No pallor.  Psychiatric: She has a normal mood and affect. Her behavior is normal. Judgment and thought content normal.  Nursing note and vitals reviewed.     Assessment & Plan:  1. Dysuria - POCT urinalysis dipstick- negative  - Urine Culture  2.  Pre-syncope - Possibly dehydration or vaso vagal. Will get basic labs  - Advised to increase fluids and follow up if episode happens again - CBC with Differential/Platelet - BMP with eGFR  Dorothyann Peng, NP

## 2017-06-26 NOTE — Telephone Encounter (Signed)
Patient Name: Joyce BeechamJENNIE [name]  DOB: 08-Jun-1931    Initial Comment caller states her mother was grocery shopping and felt weak and like she was going to pass out, checked BP and it was 155/61- ave half of a lorazapan because they thought it could be anxiety related    Nurse Assessment  Nurse: Deatra JamesNoe, RN, Corrie DandyMary Date/Time (Eastern Time): 06/26/2017 1:33:02 PM  Confirm and document reason for call. If symptomatic, describe symptoms. ---Caller states her mother was grocery shopping and felt weak and like she was going to pass out, checked BP and it was 155/61- Gave her half of a lorazapan because they thought it could be anxiety related  Does the patient have any new or worsening symptoms? ---Yes  Will a triage be completed? ---Yes  Related visit to physician within the last 2 weeks? ---No  Does the PT have any chronic conditions? (i.e. diabetes, asthma, etc.) ---Yes  List chronic conditions. ---"hx heart issues, HTN, hx stroke  Is this a behavioral health or substance abuse call? ---No     Guidelines    Guideline Title Affirmed Question Affirmed Notes  Weakness (Generalized) and Fatigue [1] MODERATE weakness (i.e., interferes with work, school, normal activities) AND [2] cause unknown (Exceptions: weakness with acute minor illness, or weakness from poor fluid intake)    Final Disposition User   See Physician within 4 Hours (or PCP triage) Deatra JamesNoe, RN, Mary    Referrals  REFERRED TO PCP OFFICE   Disagree/Comply: Comply

## 2017-06-27 LAB — URINE CULTURE: Organism ID, Bacteria: NO GROWTH

## 2017-07-03 ENCOUNTER — Telehealth: Payer: Self-pay | Admitting: Family Medicine

## 2017-07-03 NOTE — Telephone Encounter (Signed)
pts daughter is calling to get lab results.

## 2017-07-03 NOTE — Telephone Encounter (Signed)
I called the pts daughter and informed her of the results and advised her the results were sent via Mychart message on 7/10.

## 2017-07-07 ENCOUNTER — Ambulatory Visit (INDEPENDENT_AMBULATORY_CARE_PROVIDER_SITE_OTHER): Payer: Medicare Other | Admitting: Family Medicine

## 2017-07-07 ENCOUNTER — Encounter: Payer: Self-pay | Admitting: Family Medicine

## 2017-07-07 VITALS — BP 136/56 | HR 56 | Temp 98.2°F | Ht 59.0 in | Wt 137.8 lb

## 2017-07-07 DIAGNOSIS — F4322 Adjustment disorder with anxiety: Secondary | ICD-10-CM

## 2017-07-07 DIAGNOSIS — R350 Frequency of micturition: Secondary | ICD-10-CM

## 2017-07-07 DIAGNOSIS — E785 Hyperlipidemia, unspecified: Secondary | ICD-10-CM | POA: Diagnosis not present

## 2017-07-07 DIAGNOSIS — I10 Essential (primary) hypertension: Secondary | ICD-10-CM | POA: Diagnosis not present

## 2017-07-07 LAB — POC URINALSYSI DIPSTICK (AUTOMATED)
BILIRUBIN UA: NEGATIVE
GLUCOSE UA: NEGATIVE
KETONES UA: NEGATIVE
Leukocytes, UA: NEGATIVE
Nitrite, UA: NEGATIVE
Protein, UA: NEGATIVE
RBC UA: NEGATIVE
SPEC GRAV UA: 1.01 (ref 1.010–1.025)
UROBILINOGEN UA: 0.2 U/dL
pH, UA: 6 (ref 5.0–8.0)

## 2017-07-07 MED ORDER — SERTRALINE HCL 50 MG PO TABS: 50.0000 mg | ORAL_TABLET | Freq: Every day | ORAL | 5 refills | Status: DC

## 2017-07-07 NOTE — Assessment & Plan Note (Signed)
S: controlled on atenolol 25mg  BID and amlodipine 5mg  ASCVD 10 year risk calculation if age 81-79: on statin BP Readings from Last 3 Encounters:  07/07/17 (!) 136/56  06/26/17 (!) 142/52  05/27/17 138/64  A/P: We discussed blood pressure goal of <140/90 at least with stroke issue. Continue current meds

## 2017-07-07 NOTE — Assessment & Plan Note (Signed)
S: well controlled on praluent with LDL <70. No myalgias.  Lab Results  Component Value Date   CHOL 112 (L) 11/14/2015   HDL 56 11/14/2015   LDLCALC 43 11/14/2015   LDLDIRECT 45.0 02/17/2017   TRIG 64 11/14/2015   CHOLHDL 2.0 11/14/2015   A/P: continue current meds

## 2017-07-07 NOTE — Assessment & Plan Note (Signed)
S:Dizzy due to anxiety 2 weeks ago- no falls. 1/2 of ativan makes her feel slightly off. 1/4 doesn't bother her.   Anxiety higher since memory loss and concern for alzheimers dementia through study.   Feels less lonely- now feels more scared due to the diagnosis A/P: will trial zoloft at 50mg  daily. May use ativan sparingly- right now mainly using for sleep

## 2017-07-07 NOTE — Progress Notes (Signed)
Subjective:  Joyce [name] is a 83 y.o. year old very pleasant female patient who presents for/with See problem oriented charting ROS- admits to some memory issues. No chest pain or shortness of breath. sparing headache. no blurry vision.     Past Medical History-  Patient Active Problem List   Diagnosis Date Noted  . History of stroke     Priority: High  . History of transient ischemic attack (TIA) 05/16/2015    Priority: High  . CAD (coronary artery disease) 04/05/2008    Priority: High  . High bilirubin 02/17/2017    Priority: Medium  . Hyperglycemia 08/28/2015    Priority: Medium  . Overactive bladder 08/14/2014    Priority: Medium  . Essential hypertension 01/17/2010    Priority: Medium  . Hyperlipidemia 01/15/2009    Priority: Medium  . ADJUSTMENT DISORDER WITH ANXIOUS MOOD 01/15/2009    Priority: Medium  . Hypothyroidism 04/05/2008    Priority: Medium  . IBS (irritable bowel syndrome) 04/05/2008    Priority: Medium  . Lumbar pain with radiation down left leg 02/21/2011    Priority: Low  . CERUMEN IMPACTION, BILATERAL 11/27/2010    Priority: Low  . GERD 04/05/2008    Priority: Low  . INTERNAL HEMORRHOIDS 06/29/2002    Priority: Low  . Intracranial vascular stenosis 07/02/2015    Medications- reviewed and updated Current Outpatient Prescriptions  Medication Sig Dispense Refill  . alendronate (FOSAMAX) 70 MG tablet Take 1 tablet (70 mg total) by mouth every 7 (seven) days. Take with a full glass of water on an empty stomach. 4 tablet 11  . Alirocumab (PRALUENT) 75 MG/ML SOPN Inject 75 mg into the skin every 14 (fourteen) days. 2 pen 11  . amLODipine (NORVASC) 5 MG tablet TAKE 1 TABLET BY MOUTH ONCE DAILY 90 tablet 1  . Apoaequorin (PREVAGEN PO) Take 20 mg by mouth.    Marland Kitchen atenolol (TENORMIN) 25 MG tablet Take 1 tablet (25 mg total) by mouth 2 (two) times daily. 180 tablet 3  . Biotin 1000 MCG tablet Take 1,000 mcg by mouth daily.    . Calcium Carb-Cholecalciferol  (CALCIUM-VITAMIN D3) 600-400 MG-UNIT TABS Take 1 tablet by mouth daily.    . clopidogrel (PLAVIX) 75 MG tablet TAKE 1 TABLET BY MOUTH  DAILY 90 tablet 1  . CRANBERRY CONCENTRATE PO Take 1 tablet by mouth daily.    . Lactobacillus (ACIDOPHILUS) 10 MG CAPS Take 1 capsule by mouth daily.     Marland Kitchen LORazepam (ATIVAN) 0.5 MG tablet Take 0.5 tablets (0.25 mg total) by mouth at bedtime as needed for anxiety. 45 tablet 1  . nebivolol (BYSTOLIC) 5 MG tablet Take 1 tablet (5 mg total) by mouth daily. 30 tablet 5  . nitroGLYCERIN (NITROSTAT) 0.4 MG SL tablet Place 0.4 mg under the tongue every 5 (five) minutes as needed for chest pain. Reported on 03/24/2016    . Omega-3 Fatty Acids (FISH OIL) 1000 MG CAPS Take 1 capsule by mouth daily.    Marland Kitchen oxybutynin (DITROPAN) 5 MG tablet Take 5 mg by mouth 2 (two) times daily.     . Probiotic Product (ALIGN PO) Take 1 capsule by mouth daily.    Marland Kitchen pyridOXINE (B-6) 50 MG tablet Take 50 mg by mouth daily.    . ranitidine (ZANTAC) 150 MG capsule Take 150 mg by mouth 2 (two) times daily.    Marland Kitchen SYNTHROID 100 MCG tablet TAKE 1 TABLET BY MOUTH  DAILY 90 tablet 3  . vitamin B-12 (CYANOCOBALAMIN)  500 MCG tablet Take 500 mcg by mouth daily.     . vitamin E 400 UNIT capsule Take 400 Units by mouth daily.     No current facility-administered medications for this visit.     Objective: BP (!) 136/56 (BP Location: Left Arm, Patient Position: Sitting, Cuff Size: Large)   Pulse (!) 56   Temp 98.2 F (36.8 C) (Oral)   Ht 4\' 11"  (1.499 m)   Wt 137 lb 12.8 oz (62.5 kg)   LMP  (LMP Unknown)   SpO2 95%   BMI 27.83 kg/m  Gen: NAD, resting comfortably CV: RRR no murmurs rubs or gallops Lungs: CTAB no crackles, wheeze, rhonchi Abdomen: soft/nontender/nondistended/normal bowel sounds. No rebound or guarding.  Ext: no edema Skin: warm, dry Neuro: cognition deficits not apparent in visit other than focusing on some topics such as medication frequency  Assessment/Plan:  ADJUSTMENT  DISORDER WITH ANXIOUS MOOD - Plan: sertraline (ZOLOFT) 50 MG tablet S: mild frequency. Some pain in vaginal area- better with peeing. No discharge in vaginal area. No change since last 2 weeks. Doing cranberry juice.  A/P: may try vaginal lubricant- UA normal today and culture normal 2 weeks ago with no worsening symptoms.   Essential hypertension S: controlled on atenolol 25mg  BID and amlodipine 5mg  ASCVD 10 year risk calculation if age 81-79: on statin BP Readings from Last 3 Encounters:  07/07/17 (!) 136/56  06/26/17 (!) 142/52  05/27/17 138/64  A/P: We discussed blood pressure goal of <140/90 at least with stroke issue. Continue current meds  Hyperlipidemia S: well controlled on praluent with LDL <70. No myalgias.  Lab Results  Component Value Date   CHOL 112 (L) 11/14/2015   HDL 56 11/14/2015   LDLCALC 43 11/14/2015   LDLDIRECT 45.0 02/17/2017   TRIG 64 11/14/2015   CHOLHDL 2.0 11/14/2015   A/P: continue current meds   ADJUSTMENT DISORDER WITH ANXIOUS MOOD S:Dizzy due to anxiety 2 weeks ago- no falls. 1/2 of ativan makes her feel slightly off. 1/4 doesn't bother her.   Anxiety higher since memory loss and concern for alzheimers dementia through study.   Feels less lonely- now feels more scared due to the diagnosis A/P: will trial zoloft at 50mg  daily. May use ativan sparingly- right now mainly using for sleep   Return in about 6 weeks (around 08/18/2017).  Orders Placed This Encounter  Procedures  . POCT Urinalysis Dipstick (Automated)   Meds ordered this encounter  Medications  . sertraline (ZOLOFT) 50 MG tablet    Sig: Take 1 tablet (50 mg total) by mouth daily.    Dispense:  30 tablet    Refill:  5   Return precautions advised.  Tana ConchStephen Shakeira Rhee, MD

## 2017-07-07 NOTE — Patient Instructions (Addendum)
Follow up in about 6 weeks  Start zoloft. Continue sparing ativan- less is better at this point as we age and with the memory issues.   Try vaginal lubricant for burning issue

## 2017-07-09 ENCOUNTER — Telehealth: Payer: Self-pay | Admitting: Family Medicine

## 2017-07-09 ENCOUNTER — Other Ambulatory Visit: Payer: Medicare Other

## 2017-07-09 NOTE — Telephone Encounter (Signed)
Pts daughter is calling stating that the pt says that she will not take any more  Rx Zoloft due to it giving her diarrhea and she is unable to leave the house and it is making her weak.  They would like to know if they could get something else.

## 2017-07-09 NOTE — Telephone Encounter (Signed)
Please list intolerance to Zoloft under allergies under diarrhea  Have her take a few days off  Then may start Lexapro 5 mg daily #30 with 3 refills-please send this in

## 2017-07-10 ENCOUNTER — Other Ambulatory Visit: Payer: Medicare Other

## 2017-07-10 ENCOUNTER — Other Ambulatory Visit: Payer: Self-pay

## 2017-07-10 MED ORDER — ESCITALOPRAM OXALATE 5 MG PO TABS: 5.0000 mg | ORAL_TABLET | Freq: Every day | ORAL | 3 refills | Status: DC

## 2017-07-10 NOTE — Telephone Encounter (Signed)
° ° ° °  Pt daughter called and is waiting on a call back concerning her Mom

## 2017-07-10 NOTE — Telephone Encounter (Signed)
Spoke with daughter. I sent Lexapro in to the pharmacy as instructed. They are going to start that on Monday. I D/c the Zoloft.

## 2017-07-13 ENCOUNTER — Ambulatory Visit (INDEPENDENT_AMBULATORY_CARE_PROVIDER_SITE_OTHER): Payer: Medicare Other | Admitting: Family Medicine

## 2017-07-13 ENCOUNTER — Telehealth: Payer: Self-pay | Admitting: Neurology

## 2017-07-13 ENCOUNTER — Other Ambulatory Visit: Payer: Medicare Other

## 2017-07-13 ENCOUNTER — Encounter: Payer: Self-pay | Admitting: Family Medicine

## 2017-07-13 VITALS — BP 120/50 | HR 51 | Temp 98.3°F | Ht 59.0 in | Wt 136.8 lb

## 2017-07-13 DIAGNOSIS — I10 Essential (primary) hypertension: Secondary | ICD-10-CM

## 2017-07-13 DIAGNOSIS — R251 Tremor, unspecified: Secondary | ICD-10-CM

## 2017-07-13 DIAGNOSIS — I2510 Atherosclerotic heart disease of native coronary artery without angina pectoris: Secondary | ICD-10-CM | POA: Diagnosis not present

## 2017-07-13 DIAGNOSIS — N3281 Overactive bladder: Secondary | ICD-10-CM

## 2017-07-13 DIAGNOSIS — R300 Dysuria: Secondary | ICD-10-CM | POA: Diagnosis not present

## 2017-07-13 DIAGNOSIS — E034 Atrophy of thyroid (acquired): Secondary | ICD-10-CM

## 2017-07-13 DIAGNOSIS — F4322 Adjustment disorder with anxiety: Secondary | ICD-10-CM

## 2017-07-13 NOTE — Assessment & Plan Note (Signed)
Update tsh. Doubt cause of symptoms

## 2017-07-13 NOTE — Progress Notes (Signed)
Subjective:  Joyce [name] is a 83 y.o. year old very pleasant female patient who presents for/with See problem oriented charting ROS- feels more weak than usual, shaky at times. Dealing with some anxiety. Slight SOB. No chest pain.    Past Medical History-  Patient Active Problem List   Diagnosis Date Noted  . History of stroke     Priority: High  . History of transient ischemic attack (TIA) 05/16/2015    Priority: High  . CAD (coronary artery disease) 04/05/2008    Priority: High  . High bilirubin 02/17/2017    Priority: Medium  . Hyperglycemia 08/28/2015    Priority: Medium  . Overactive bladder 08/14/2014    Priority: Medium  . Essential hypertension 01/17/2010    Priority: Medium  . Hyperlipidemia 01/15/2009    Priority: Medium  . ADJUSTMENT DISORDER WITH ANXIOUS MOOD 01/15/2009    Priority: Medium  . Hypothyroidism 04/05/2008    Priority: Medium  . IBS (irritable bowel syndrome) 04/05/2008    Priority: Medium  . Lumbar pain with radiation down left leg 02/21/2011    Priority: Low  . CERUMEN IMPACTION, BILATERAL 11/27/2010    Priority: Low  . GERD 04/05/2008    Priority: Low  . INTERNAL HEMORRHOIDS 06/29/2002    Priority: Low  . Intracranial vascular stenosis 07/02/2015    Medications- reviewed and updated Current Outpatient Prescriptions  Medication Sig Dispense Refill  . alendronate (FOSAMAX) 70 MG tablet Take 1 tablet (70 mg total) by mouth every 7 (seven) days. Take with a full glass of water on an empty stomach. 4 tablet 11  . Alirocumab (PRALUENT) 75 MG/ML SOPN Inject 75 mg into the skin every 14 (fourteen) days. 2 pen 11  . amLODipine (NORVASC) 5 MG tablet TAKE 1 TABLET BY MOUTH ONCE DAILY 90 tablet 1  . Apoaequorin (PREVAGEN PO) Take 20 mg by mouth.    Marland Kitchen atenolol (TENORMIN) 25 MG tablet Take 1 tablet (25 mg total) by mouth 2 (two) times daily. 180 tablet 3  . Biotin 1000 MCG tablet Take 1,000 mcg by mouth daily.    . Calcium Carb-Cholecalciferol  (CALCIUM-VITAMIN D3) 600-400 MG-UNIT TABS Take 1 tablet by mouth daily.    . clopidogrel (PLAVIX) 75 MG tablet TAKE 1 TABLET BY MOUTH  DAILY 90 tablet 1  . CRANBERRY CONCENTRATE PO Take 1 tablet by mouth daily.    . Lactobacillus (ACIDOPHILUS) 10 MG CAPS Take 1 capsule by mouth daily.     Marland Kitchen LORazepam (ATIVAN) 0.5 MG tablet Take 0.5 tablets (0.25 mg total) by mouth at bedtime as needed for anxiety. 45 tablet 1  . nitroGLYCERIN (NITROSTAT) 0.4 MG SL tablet Place 0.4 mg under the tongue every 5 (five) minutes as needed for chest pain. Reported on 03/24/2016    . Omega-3 Fatty Acids (FISH OIL) 1000 MG CAPS Take 1 capsule by mouth daily.    . Probiotic Product (ALIGN PO) Take 1 capsule by mouth daily.    Marland Kitchen pyridOXINE (B-6) 50 MG tablet Take 50 mg by mouth daily.    . ranitidine (ZANTAC) 150 MG capsule Take 150 mg by mouth 2 (two) times daily.    Marland Kitchen SYNTHROID 100 MCG tablet TAKE 1 TABLET BY MOUTH  DAILY 90 tablet 3  . vitamin B-12 (CYANOCOBALAMIN) 500 MCG tablet Take 500 mcg by mouth daily.     . vitamin E 400 UNIT capsule Take 400 Units by mouth daily.    Marland Kitchen escitalopram (LEXAPRO) 5 MG tablet Take 1 tablet (5 mg  total) by mouth daily. (Patient not taking: Reported on 07/13/2017) 30 tablet 3   No current facility-administered medications for this visit.     Objective: BP (!) 120/50 (BP Location: Left Arm, Patient Position: Sitting, Cuff Size: Large)   Pulse (!) 51   Temp 98.3 F (36.8 C) (Oral)   Ht 4\' 11"  (1.499 m)   Wt 136 lb 12.8 oz (62.1 kg)   LMP  (LMP Unknown)   SpO2 94%   BMI 27.63 kg/m  Gen: NAD, resting comfortably CV: regular rhythm but slow rate-in low 50s-no murmurs rubs or gallops Lungs: CTAB no crackles, wheeze, rhonchi Abdomen: soft/nontender/nondistended/normal bowel sounds.  Ext: no edema Skin: warm, dry Neuro: answers questions appropriately today  Assessment/Plan:  Patient Instructions  STOP oxybutynin  Take only half of atenolol half a day  HOLD off on starting  lexapro for 2 weeks (until back from [place])  Perhaps schedule to see me a few days after getting [place]  See us back immediately for new or worsening symptoms (sorry im out of town next week)   Overactive bladder S: as it is thought she is having more confusion and with new alzheimers diagnosis, we discussed risks of oxybutynin A/P: Patient agrees to discontinue. Likely not a lot of great options given cardiac history for her for the OAB.    ADJUSTMENT DISORDER WITH ANXIOUS MOOD SOB slightly more than usual Generalized weaknss Shakiness S:Diarrhea after 2 days of zoloft, shaky and worse on zoloft. We stopped zoloft and considered lexapro- she is not starting this yet as daughter going out of town then [place] are going to [place] next [place]. She feels like anxiety is slightly better. Patient still feels like she is shakier and more anxious at times and feels weak then and when gets really shaky feels like memory is not as sharp . Food improves this. hasnt been to church in 2 weeks which is big for her. Daughter is helping with pills and apparently one day there were 2 atenolol noted.  A/P: we discussed lexapro when returns from trip - over 2 weeks. She continues sparing ativan.   I also have questions about her shakiness being low blood sugar relatd though not on meds for this. She feels better when she eats. Daughte rreally encouraging ensure or gatorade to give her some calories through the day- I encouraged this.   Wonder if some anxiety or shakiness could be from low BP (noted today) or low HR (at 51) so we opted to decrease atenolol. Possible she was taking too much but daughter helping with pill box now.  As for confusion during shaky spells- opted to stop oxybutynin as noted.   No chest pain with episodes of being weak/shaky. No sweating or left arm or neck pain or diaphoresis. Doubt CAD related. Does feel winded with activity more than usual. Weakness once again improved by  food though.   Finally check another urine culture though doubt UTI- has been tested recently with similar mild dysuria "that is on the inside" per patient"  Also considered x-ray  Hypothyroidism Update tsh. Doubt cause of symptoms  CAD (coronary artery disease) Doubt CAD- low risk stress test about 3 years ago- had some chest pain with anxiety. No chest pain at present though is winded some. Could consider echo or cxr depending on how she responds to cutting meds back.   Essential hypertension S: controlled very well today but has run normal or high normal in past on amlodipine  5mg  and atenolol 25mg  BID.  BP Readings from Last 3 Encounters:  07/13/17 (!) 120/50  07/07/17 (!) 136/56  06/26/17 (!) 142/52  A/P: We discussed blood pressure goal of <140/90. Continue current meds:  But reduce atenolol to 12.5mg  BID to see if higher HR helps her with symptoms.    Update TSH to make sure that is not contributing as well as CBC and CMP  Follow up within 3 weeks (when she is in town as am I- im out next week).  Consider orthostatics- wonder if perceived weakness is orthostasis  Orders Placed This Encounter  Procedures  . Urine Culture    [facility]  . TSH    [facility]  . CBC with Differential/Platelet  . Comprehensive metabolic panel    [facility]    No orders of the defined types were placed in this encounter.   Return precautions advised.  Tana Conch, MD

## 2017-07-13 NOTE — Assessment & Plan Note (Signed)
S: controlled very well today but has run normal or high normal in past on amlodipine 5mg  and atenolol 25mg  BID.  BP Readings from Last 3 Encounters:  07/13/17 (!) 120/50  07/07/17 (!) 136/56  06/26/17 (!) 142/52  A/P: We discussed blood pressure goal of <140/90. Continue current meds:  But reduce atenolol to 12.5mg  BID to see if higher HR helps her with symptoms.

## 2017-07-13 NOTE — Assessment & Plan Note (Signed)
S: as it is thought she is having more confusion and with new alzheimers diagnosis, we discussed risks of oxybutynin A/P: Patient agrees to discontinue. Likely not a lot of great options given cardiac history for her for the OAB.

## 2017-07-13 NOTE — Patient Instructions (Signed)
STOP oxybutynin  Take only half of atenolol half a day  HOLD off on starting lexapro for 2 weeks (until back from [place])  Perhaps schedule to see me a few days after getting back [place]  See us back immediately for new or worsening symptoms (sorry im out of town next week)

## 2017-07-13 NOTE — Telephone Encounter (Signed)
I received a call from research coordinator that patient's daughter had called and expressed desire for patient to switch from [facility] trial to graduate study. I called the patient's daughter who informed me that for the last few days she had not been feeling well and was jittery and more confused. She saw her primary care physician today who found her blood pressure to be low and reduced the dose of atenolol and one more medicine. They want to wait a few weeks to see if she gets better. I explained to her the differences between the [place] and [facility] study and they want to stay in the [place] [facility] but wait couple of weeks for her to improve.. I answered her questions and she voiced understanding

## 2017-07-13 NOTE — Assessment & Plan Note (Signed)
SOB slightly more than usual Generalized weaknss Shakiness S:Diarrhea after 2 days of zoloft, shaky and worse on zoloft. We stopped zoloft and considered lexapro- she is not starting this yet as daughter going out of town then they are going to [place] next week. She feels like anxiety is slightly better. Patient still feels like she is shakier and more anxious at times and feels weak then and when gets really shaky feels like memory is not as sharp . Food improves this. hasnt been to church in 2 weeks which is big for her. Daughter is helping with pills and apparently one day there were 2 atenolol noted.  A/P: we discussed lexapro when returns from trip - over 2 weeks. She continues sparing ativan.   I also have questions about her shakiness being low blood sugar relatd though not on meds for this. She feels better when she eats. Daughte rreally encouraging ensure or gatorade to give her some calories through the day- I encouraged this.   Wonder if some anxiety or shakiness could be from low BP (noted today) or low HR (at 51) so we opted to decrease atenolol. Possible she was taking too much but daughter helping with pill box now.  As for confusion during shaky spells- opted to stop oxybutynin as noted.   No chest pain with episodes of being weak/shaky. No sweating or left arm or neck pain or diaphoresis. Doubt CAD related. Does feel winded with activity more than usual. Weakness once again improved by food though.   Finally check another urine culture though doubt UTI- has been tested recently with similar mild dysuria "that is on the inside" per patient"  Also considered x-ray

## 2017-07-13 NOTE — Assessment & Plan Note (Signed)
Doubt CAD- low risk stress test about 3 years ago- had some chest pain with anxiety. No chest pain at present though is winded some. Could consider echo or cxr depending on how she responds to cutting meds back.

## 2017-07-14 LAB — COMPREHENSIVE METABOLIC PANEL
ALBUMIN: 4.4 g/dL (ref 3.5–5.2)
ALT: 16 U/L (ref 0–35)
AST: 20 U/L (ref 0–37)
Alkaline Phosphatase: 38 U/L — ABNORMAL LOW (ref 39–117)
BILIRUBIN TOTAL: 1.4 mg/dL — AB (ref 0.2–1.2)
BUN: 10 mg/dL (ref 6–23)
CALCIUM: 9.8 mg/dL (ref 8.4–10.5)
CO2: 32 meq/L (ref 19–32)
Chloride: 98 mEq/L (ref 96–112)
Creatinine, Ser: 0.69 mg/dL (ref 0.40–1.20)
GFR: 86.05 mL/min (ref >60.00)
Glucose, Bld: 104 mg/dL — ABNORMAL HIGH (ref 70–99)
Potassium: 3.7 mEq/L (ref 3.5–5.1)
SODIUM: 138 meq/L (ref 135–145)
Total Protein: 7 g/dL (ref 6.0–8.3)

## 2017-07-14 LAB — CBC WITH DIFFERENTIAL/PLATELET
BASOS ABS: 0.1 10*3/uL (ref 0.0–0.1)
Basophils Relative: 1.1 % (ref 0.0–3.0)
EOS ABS: 0.1 10*3/uL (ref 0.0–0.7)
Eosinophils Relative: 0.9 % (ref 0.0–5.0)
HEMATOCRIT: 45.5 % (ref 36.0–46.0)
HEMOGLOBIN: 15.4 g/dL — AB (ref 12.0–15.0)
LYMPHS PCT: 27.2 % (ref 12.0–46.0)
Lymphs Abs: 2 10*3/uL (ref 0.7–4.0)
MCHC: 34 g/dL (ref 30.0–36.0)
MCV: 89.7 fl (ref 78.0–100.0)
Monocytes Absolute: 0.6 10*3/uL (ref 0.1–1.0)
Monocytes Relative: 8.7 % (ref 3.0–12.0)
Neutro Abs: 4.5 10*3/uL (ref 1.4–7.7)
Neutrophils Relative %: 62.1 % (ref 43.0–77.0)
Platelets: 288 10*3/uL (ref 150.0–400.0)
RBC: 5.07 Mil/uL (ref 3.87–5.11)
RDW: 13.6 % (ref 11.5–15.5)
WBC: 7.3 10*3/uL (ref 4.0–10.5)

## 2017-07-14 LAB — TSH: TSH: 4.19 u[IU]/mL (ref 0.35–4.50)

## 2017-07-15 ENCOUNTER — Telehealth: Payer: Self-pay | Admitting: Family Medicine

## 2017-07-15 LAB — URINE CULTURE: ORGANISM ID, BACTERIA: NO GROWTH

## 2017-07-15 NOTE — Telephone Encounter (Signed)
Could send in zofran 4mg  ODT #20 q8 hours as needed for nausea. Given ongoing weakness and heart history- could see if cardiology thinks everything is stable from their perspective

## 2017-07-15 NOTE — Telephone Encounter (Signed)
° °  Pt daughter call to say her Mom is still feeling weak and nausea and would like a call back.      [phone]   Cell number

## 2017-07-15 NOTE — Telephone Encounter (Signed)
Called and spoke to daughter Lafonda MossesDiana who verbalized understanding of lab results. They want to know if there is anything else that can be done.

## 2017-07-17 ENCOUNTER — Other Ambulatory Visit: Payer: Self-pay

## 2017-07-17 MED ORDER — ONDANSETRON 4 MG PO TBDP: 4.0000 mg | ORAL_TABLET | Freq: Three times a day (TID) | ORAL | 0 refills | Status: DC | PRN

## 2017-07-17 NOTE — Telephone Encounter (Signed)
Spoke to daughter who verbalized understanding and will be calling cardiology to make a follow up appointment. She verbalized understanding [facility] and will arrange to get it picked up

## 2017-07-17 NOTE — Telephone Encounter (Signed)
Medication sent to pharmacy. Calling daughter to make her aware

## 2017-07-20 ENCOUNTER — Telehealth: Payer: Self-pay | Admitting: Cardiovascular Disease

## 2017-07-20 NOTE — Telephone Encounter (Signed)
Spoke with patient's daughter-in-law Randa EvensJoanne who states that patient has been evaluated by PCP for hypotension and weakness and was advised to make an appointment with her cardiologist. Patient has an appointment for 8/21 with Tereso NewcomerScott  Weaver, PA but is requesting something sooner. Patient will be out of town next week so family is requesting appointment on 8/13 or 8/14. I rescheduled patient to 8/14 with Tereso NewcomerScott Weaver, PA. Patient's daughter-in-law was very thankful.

## 2017-07-20 NOTE — Telephone Encounter (Signed)
New Message     Pt c/o BP issue: STAT if pt c/o blurred vision, one-sided weakness or slurred speech  1. What are your last 5 BP readings? 142/57 135/60   2. Are you having any other symptoms (ex. Dizziness, headache, blurred vision, passed out)?  Dizziness, lightheadness, she has beginning of Alzheimer , dehydrated   3. What is your BP issue? Daughter thinks she has hypotension

## 2017-07-23 ENCOUNTER — Other Ambulatory Visit: Payer: Self-pay | Admitting: Cardiovascular Disease

## 2017-08-04 ENCOUNTER — Ambulatory Visit (INDEPENDENT_AMBULATORY_CARE_PROVIDER_SITE_OTHER): Payer: Medicare Other | Admitting: Physician Assistant

## 2017-08-04 ENCOUNTER — Encounter: Payer: Self-pay | Admitting: Physician Assistant

## 2017-08-04 VITALS — Ht 59.0 in | Wt 138.6 lb

## 2017-08-04 DIAGNOSIS — R001 Bradycardia, unspecified: Secondary | ICD-10-CM

## 2017-08-04 DIAGNOSIS — I951 Orthostatic hypotension: Secondary | ICD-10-CM | POA: Diagnosis not present

## 2017-08-04 DIAGNOSIS — I2510 Atherosclerotic heart disease of native coronary artery without angina pectoris: Secondary | ICD-10-CM

## 2017-08-04 DIAGNOSIS — R413 Other amnesia: Secondary | ICD-10-CM

## 2017-08-04 DIAGNOSIS — R0602 Shortness of breath: Secondary | ICD-10-CM | POA: Diagnosis not present

## 2017-08-04 DIAGNOSIS — I10 Essential (primary) hypertension: Secondary | ICD-10-CM | POA: Diagnosis not present

## 2017-08-04 DIAGNOSIS — E785 Hyperlipidemia, unspecified: Secondary | ICD-10-CM

## 2017-08-04 NOTE — Progress Notes (Signed)
Cardiology Office Note:    Date:  08/04/2017   ID:  Joyce AlarJennie G Sudano, DOB 1931-04-03, MRN [id number]  PCP:  Shelva MajesticHunter, Stephen O, MD  Cardiologist:  Dr. Tonny BollmanMichael Cooper    Referring MD: Shelva MajesticHunter, Stephen O, MD   Chief Complaint  Patient presents with  . Shortness of Breath  . Fatigue    History of Present Illness:    Joyce [name] is a 83 y.o. female with a hx of CAD s/p PCI to the LCx in 2002, prior stroke, HTN, HL, GERD, hypothyroidism. She is intolerant to statins and has been treated with PCSK-9 inhibitor therapy for hyperlipidemia. She was placed on Plavix at the time of her stroke. Last seen by Dr. Excell Seltzerooper 4/18.  Joyce [name] returns for evaluation of weakness and dyspnea. She is here today with her daughter. Over the past several weeks, she has noted low diastolic blood pressure readings. She feels weak at any point during the day. She had an episode of what sounds like near syncope while shopping in the grocery store recently. She denies frank syncope. She's had occasional episodes of chest discomfort which are brief and nonexertional. She has no associated symptoms. She has noted dyspnea with mild to moderate activities. She sleeps on 2 pillows chronically. She denies PND. She's had mild pedal edema without significant change. She's had some nausea in the mornings but no vomiting. She's had some diarrhea but denies bleeding issues. She denies any wheezing. Her appetite has been poor and she was recently placed on ensure. Her daughter notes that her blood pressure machine at home did document a heart rate in the 30s recently.  She reduced her atenolol on her own and is now only taking 12.5 mg daily. She has felt somewhat better since reducing the dose of her beta blocker.  Prior CV studies:   The following studies were reviewed today:  Echo 5/16 EF 60, normal wall motion, normal RVSF  Myoview 9/15 Low risk stress nuclear study with a small area mild severity ischemia in the distal LAD (SDS  2). LV Ejection Fraction: 71%.  Echo 12/14 Vigorous LVF, EF 65-70, normal wall motion  Carotid US 12/14 Bilateral ICA 1-39  Myoview 8/13 Normal stress nuclear study. EF 81.  Myoview 02/2009:  EF 83%, no scar or ischemia.    LHC 12/2002:   Proximal LAD 50%, proximal circumflex stents patent, OM1 proximal 30%, mid RCA 30%, EF 70%.   Past Medical History:  Diagnosis Date  . Adjustment disorder with anxiety STRESS CHEST DISCOMFORT   PT RECENTLY PUT HUSBAND IN NURSING HOME  . Borderline glaucoma   . Candidiasis of skin and nails   . Coronary atherosclerosis of unspecified type of vessel, native or graft CARDIOLOGIST- [facility]   DENIES S & S  . GERD (gastroesophageal reflux disease)   . History of sleep apnea YRS AGO ON CPAP UNTIL LOST WT  . Hyperlipidemia   . Hypothyroidism   . IBS (irritable bowel syndrome)   . Internal hemorrhoids without mention of complication   . Lesion of bladder   . Nocturia   . Numbness and tingling of foot LEFT -- SECONDARY TO PINCHED LUMBAR NERVE  . Pinched nerve LUMBAR   RESIDUAL LEFT FOOT NUMBNESS/ TINGLING  . Scoliosis   . Sinusitis 09/29/2013  . Stress incontinence   . Unspecified essential hypertension     Past Surgical History:  Procedure Laterality Date  . CARDIAC CATHETERIZATION  01-10-2003   DR Chrissie NoaWILLIAM DOWNEY   PATENT  CIRCUMFLEX STENT/ MODERATE CAD ELSEWHERE  . CARDIOVASCULAR STRESS TEST  07-27-2012   NORMAL NUCLEAR STUDY/ LVEF 81%  . CATARACT EXTRACTION W/ INTRAOCULAR LENS  IMPLANT, BILATERAL    . CHOLECYSTECTOMY  1976  . CORONARY ANGIOPLASTY WITH STENT PLACEMENT  08-31-2001   DR Smitty Cords BRODIE   STENTING OF PROXIMAL CIRCUMFLEX (75% TO 0%)   . CYSTOSCOPY WITH BIOPSY  08/02/2012   Procedure: CYSTOSCOPY WITH BIOPSY;  Surgeon: Milford Cage, MD;  Location: [facility];  Service: Urology;  Laterality: N/A;  . VAGINAL HYSTERECTOMY  1968    Current Medications: Current Meds  Medication Sig  . alendronate  (FOSAMAX) 70 MG tablet Take 1 tablet (70 mg total) by mouth every 7 (seven) days. Take with a full glass of water on an empty stomach.  Marland Kitchen amLODipine (NORVASC) 5 MG tablet TAKE 1 TABLET BY MOUTH ONCE DAILY  . Apoaequorin (PREVAGEN PO) Take 20 mg by mouth.  . Calcium Carb-Cholecalciferol (CALCIUM-VITAMIN D3) 600-400 MG-UNIT TABS Take 1 tablet by mouth daily.  . clopidogrel (PLAVIX) 75 MG tablet TAKE 1 TABLET BY MOUTH  DAILY  . CRANBERRY CONCENTRATE PO Take 1 tablet by mouth daily.  Marland Kitchen escitalopram (LEXAPRO) 5 MG tablet Take 1 tablet (5 mg total) by mouth daily.  . Lactobacillus (ACIDOPHILUS) 10 MG CAPS Take 1 capsule by mouth daily.   Marland Kitchen LORazepam (ATIVAN) 0.5 MG tablet Take 0.5 tablets (0.25 mg total) by mouth at bedtime as needed for anxiety.  . nitroGLYCERIN (NITROSTAT) 0.4 MG SL tablet Place 0.4 mg under the tongue every 5 (five) minutes as needed for chest pain. Reported on 03/24/2016  . Omega-3 Fatty Acids (FISH OIL) 1000 MG CAPS Take 1 capsule by mouth daily.  . ondansetron (ZOFRAN ODT) 4 MG disintegrating tablet Take 1 tablet (4 mg total) by mouth every 8 (eight) hours as needed for nausea or vomiting.  Marland Kitchen PRALUENT 75 MG/ML SOPN INJECT SUBCUTANEOUSLY 75MG  EVERY TWO WEEKS  . Probiotic Product (ALIGN PO) Take 1 capsule by mouth daily.  Marland Kitchen pyridOXINE (B-6) 50 MG tablet Take 50 mg by mouth daily.  . ranitidine (ZANTAC) 150 MG capsule Take 150 mg by mouth 2 (two) times daily.  Marland Kitchen SYNTHROID 100 MCG tablet TAKE 1 TABLET BY MOUTH  DAILY  . vitamin B-12 (CYANOCOBALAMIN) 500 MCG tablet Take 500 mcg by mouth daily.   . vitamin E 400 UNIT capsule Take 400 Units by mouth daily.  . [DISCONTINUED] atenolol (TENORMIN) 25 MG tablet TAKE 1/2 TABLET BY MOUTH ONE TIME DAILY     Allergies:   Loratadine; Zoloft [sertraline hcl]; Adhesive [tape]; Aspirin; Levaquin [levofloxacin in d5w]; Levofloxacin; Lumigan [bimatoprost]; Mobic [meloxicam]; Penicillins; and Statins   Social History   Social History  . Marital  status: Married    Spouse name: N/A  . Number of children: N/A  . Years of education: N/A   Occupational History  . retired Retired   Social History Main Topics  . Smoking status: Former Smoker    Packs/day: 1.50    Years: 20.00    Types: Cigarettes    Quit date: 07/31/1983  . Smokeless tobacco: Never Used  . Alcohol use No  . Drug use: No  . Sexual activity: No   Other Topics Concern  . None   Social History Narrative   Widowed December 2014. Married just shy of 64 years. 3 kids (1 daughter that has essentially left the family x 3 yers). 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       Hobbies: baking, cooking, sewing, planning on water aerobics, active in church ([place] [employer])     Family Hx: The patient's family history includes CAD in her father; Heart disease in her mother.  ROS:   Please see the history of present illness.    Review of Systems  Constitution: Positive for malaise/fatigue.  Cardiovascular: Positive for dyspnea on exertion.  Neurological: Positive for headaches.   All other systems reviewed and are negative.   EKGs/Labs/Other Test Reviewed:    EKG:  EKG is  ordered today.  The ekg ordered today demonstrates Sinus bradycardia, HR 53, right axis deviation, T-wave inversions 2, 3, aVF, V5-V6 (similar to prior tracing dated 12/03/15), incomplete RBBB, QTc 442 ms  Recent Labs: 07/13/2017: ALT 16; BUN 10; Creatinine, Ser 0.69; Hemoglobin 15.4; Platelets 288.0; Potassium 3.7; Sodium 138; TSH 4.19   Recent Lipid Panel Lab Results  Component Value Date/Time   CHOL 112 (L) 11/14/2015 08:01 AM   TRIG 64 11/14/2015 08:01 AM   HDL 56 11/14/2015 08:01 AM   CHOLHDL 2.0 11/14/2015 08:01 AM   LDLCALC 43 11/14/2015 08:01 AM   LDLDIRECT 45.0 02/17/2017 12:06 PM    Physical Exam:    VS:  Ht 4\' 11"  (1.499 m)   Wt 138 lb 9.6 oz (62.9 kg)   LMP  (LMP Unknown)   SpO2 94%   BMI 27.99  kg/m     Orthostatic VS for the past 24 hrs (Last 3 readings):  BP- Lying Pulse- Lying BP- Sitting Pulse- Sitting BP- Standing at 0 minutes Pulse- Standing at 0 minutes BP- Standing at 3 minutes Pulse- Standing at 3 minutes  08/04/17 1138 145/63 52 119/60 51 103/58 57 124/68 57   Wt Readings from Last 3 Encounters:  08/04/17 138 lb 9.6 oz (62.9 kg)  07/13/17 136 lb 12.8 oz (62.1 kg)  07/07/17 137 lb 12.8 oz (62.5 kg)     Physical Exam  Constitutional: She is oriented to person, place, and time. She appears well-developed and well-nourished. No distress.  HENT:  Head: Normocephalic and atraumatic.  Eyes: No scleral icterus.  Neck: No JVD present.  Cardiovascular: Normal rate and regular rhythm.   No murmur heard. Pulmonary/Chest: Effort normal. She has no rales.  Abdominal: She exhibits no distension. There is no tenderness.  Musculoskeletal: She exhibits no edema.  Neurological: She is alert and oriented to person, place, and time.  Skin: Skin is warm and dry.  Psychiatric: She has a normal mood and affect.    ASSESSMENT:    1. Shortness of breath   2. Orthostatic hypotension   3. Bradycardia   4. Coronary artery disease involving native coronary artery of native heart without angina pectoris   5. Essential hypertension   6. Hyperlipidemia, unspecified hyperlipidemia type   7. Memory loss    PLAN:    In order of problems listed above:  1. Shortness of breath -  She has noted worsening shortness of breath with activity recently. She does not demonstrate significant signs of volume excess on exam. Question if her dyspnea could be an anginal equivalent. Her ECG does not demonstrate any significant change when compared to prior tracings. She has had some memory issues recently. She does not remember to eat at times. Question if her weakness and dyspnea may be related to more symptoms of failure to thrive. Recent blood work with her primary care doctor did demonstrate normal  hemoglobin, normal TSH, normal creatinine, negative urine culture.  -  Arrange echocardiogram  -  Discontinue atenolol  -  Follow-up in 3-4 weeks  2. Orthostatic hypotension She does have a significant blood pressure drop from lying to standing. Recent hemoglobin and BUN/creatinine were normal. She typically wears compression stockings. I have encouraged her to continue to wear compression stockings to avoid significant drops in her blood pressure from lying to standing.  3. Bradycardia -  Her heart rate is unchanged compared to prior ECG. However, she recently had a heart rate of 30 documented on her blood pressure machine. Question if she has progressive conduction system disease. She may also have chronotropic incompetence. As noted, I will discontinue her atenolol. We will obtain Holter monitor 48 hours in about one week to screen for high-grade heart block.  4. Coronary artery disease involving native coronary artery of native heart without angina pectoris -  Prior PCI to the LCx in 2002. Low risk Myoview in 2015. She has had some atypical chest pains recently. As noted, her dyspnea may be an anginal equivalent. Initial workup will include an echocardiogram, discontinuation of her beta blocker and Holter monitor. If symptoms continue, consider stress testing.  5. Essential hypertension Borderline control. She does have some orthostatic hypotension. Continue to monitor with discontinuation of her beta blocker.  6. Hyperlipidemia, unspecified hyperlipidemia type Continue PCSK-9 inhibitor therapy.  7. Memory loss She is followed by neurology. Her daughter notes that she may need medical therapy to help with memory loss. Given the patient's recent symptoms and bradycardia, she should not be given an acetylcholinesterase inhibitor to avoid worsening bradycardia.   Dispo:  Return in about 4 weeks (around 09/01/2017) for Close Follow Up, w/ Dr. Excell Seltzer, or Tereso Newcomer, PA-C.   Medication  Adjustments/Labs and Tests Ordered: Current medicines are reviewed at length with the patient today.  Concerns regarding medicines are outlined above.  Tests Ordered: Orders Placed This Encounter  Procedures  . Holter monitor - 48 hour  . EKG 12-Lead  . ECHOCARDIOGRAM COMPLETE   Medication Changes: No orders of the defined types were placed in this encounter.   Signed, Tereso Newcomer, PA-C  08/04/2017 5:27 PM    [facility] [address] [place], [place], [place]  [address] Phone: [phone]; Fax: [phone]

## 2017-08-04 NOTE — Patient Instructions (Addendum)
Medication Instructions:  1. STOP ATENOLOL  Labwork: NONE ORDERED TODAY  Testing/Procedures: 1. Your physician has requested that you have an echocardiogram. Echocardiography is a painless test that uses sound waves to create images of your heart. It provides your doctor with information about the size and shape of your heart and how well your heart's chambers and valves are working. This procedure takes approximately one hour. There are no restrictions for this procedure. SEE IF THIS CAN BE DONE NEXT WEEK SAME DAY HOLTER MONITOR ;SEE IF THIS CAN BE DONE EITHER Thursday OR FRIDAY  2. Your physician has recommended that you wear a 48 HOUR holter monitor. Holter monitors are medical devices that record the heart's electrical activity. Doctors most often use these monitors to diagnose arrhythmias. Arrhythmias are problems with the speed or rhythm of the heartbeat. The monitor is a small, portable device. You can wear one while you do your normal daily activities. This is usually used to diagnose what is causing palpitations/syncope (passing out). SEE IF THIS CAN BE DONE EITHER Thursday OR FRIDAY    Follow-Up: SCOTT WEAVER, PAC 3-4 WEEKS SAME DAY DR. Excell SeltzerOOPER IS IN THE OFFICE  Any Other Special Instructions Will Be Listed Below (If Applicable).  CONTINUE TO WEAR COMPRESSION STOCKINGS DAILY    If you need a refill on your cardiac medications before your next appointment, please call your pharmacy.

## 2017-08-06 ENCOUNTER — Other Ambulatory Visit: Payer: Self-pay

## 2017-08-06 MED ORDER — RANITIDINE HCL 150 MG PO CAPS: 150.0000 mg | ORAL_CAPSULE | Freq: Two times a day (BID) | ORAL | 1 refills | Status: DC

## 2017-08-08 ENCOUNTER — Other Ambulatory Visit: Payer: Self-pay | Admitting: Family Medicine

## 2017-08-10 ENCOUNTER — Ambulatory Visit
Admission: RE | Admit: 2017-08-10 | Discharge: 2017-08-10 | Disposition: A | Discharge status: Home / Self Care | Payer: No Typology Code available for payment source | Source: Ambulatory Visit | Attending: Neurology | Admitting: Neurology

## 2017-08-10 DIAGNOSIS — F0280 Dementia in other diseases classified elsewhere without behavioral disturbance: Secondary | ICD-10-CM

## 2017-08-10 NOTE — Telephone Encounter (Signed)
Spoke with pharmacy stated that they wanted to verify if pt is taking the Tab or Capsule since pt insurance does not cover for capsule.Refill has been provided.

## 2017-08-10 NOTE — Telephone Encounter (Signed)
Rx was refilled and e scribed to [facility] pharmacy on the 08/07/2015 for #180 with 1 refill.

## 2017-08-11 ENCOUNTER — Ambulatory Visit: Payer: Medicare Other | Admitting: Physician Assistant

## 2017-08-13 ENCOUNTER — Ambulatory Visit (INDEPENDENT_AMBULATORY_CARE_PROVIDER_SITE_OTHER): Payer: Medicare Other | Admitting: Family Medicine

## 2017-08-13 ENCOUNTER — Encounter: Payer: Self-pay | Admitting: Family Medicine

## 2017-08-13 DIAGNOSIS — F4322 Adjustment disorder with anxiety: Secondary | ICD-10-CM | POA: Diagnosis not present

## 2017-08-13 DIAGNOSIS — I10 Essential (primary) hypertension: Secondary | ICD-10-CM | POA: Diagnosis not present

## 2017-08-13 DIAGNOSIS — N3281 Overactive bladder: Secondary | ICD-10-CM

## 2017-08-13 MED ORDER — RANITIDINE HCL 150 MG PO TABS: 150.0000 mg | ORAL_TABLET | Freq: Two times a day (BID) | ORAL | 0 refills | Status: DC

## 2017-08-13 MED ORDER — ESCITALOPRAM OXALATE 10 MG PO TABS: 10.0000 mg | ORAL_TABLET | Freq: Every day | ORAL | 5 refills | Status: DC

## 2017-08-13 NOTE — Patient Instructions (Signed)
Increase lexapro to 10mg   No other changes today  Reassess 6 weeks from now  Asher Muir will go through the injection with you

## 2017-08-13 NOTE — Assessment & Plan Note (Signed)
S: had tried zoloft fro anxiety but felt shaky and worse on zoloft. Had sent in lexapro but had trip to [place] so didn't want to start at last visit. When anxiety is worse- feels shaky and like memory not as sharp. Still feels like when anxiety or memory is worse that food improves this. We had encouraged her keeping calories in and using beverages like ensure or gatorade to help   She started lexapro on the 10th (about 2 weeks ago). Anxiety has improved slightly. Sparing ativan. Anxiety is improved but reports Memory seems slightly worse.   Family has hired someone to come 2 mornings a week and that helps A/P: increase lexapro to 10mg  from 5mg  and follow up in 6 weeks

## 2017-08-13 NOTE — Assessment & Plan Note (Signed)
S: discontinued oxybutynin last viist due to alzheimers diagnosis and confusion. Worsening incontinence.  A/P: No great options for her due to cardiac history. Continue without new rx. Did not seem to help with confusion as reportedly mildly worse

## 2017-08-13 NOTE — Progress Notes (Signed)
Subjective:  Joyce [name] is a 83 y.o. year old very pleasant female patient who presents for/with See problem oriented charting ROS- memory loss, no chest pain, some shortness of breath but mild, no worsening edema   Past Medical History-  Patient Active Problem List   Diagnosis Date Noted  . History of stroke     Priority: High  . History of transient ischemic attack (TIA) 05/16/2015    Priority: High  . CAD (coronary artery disease) 04/05/2008    Priority: High  . High bilirubin 02/17/2017    Priority: Medium  . Hyperglycemia 08/28/2015    Priority: Medium  . Overactive bladder 08/14/2014    Priority: Medium  . Essential hypertension 01/17/2010    Priority: Medium  . Hyperlipidemia 01/15/2009    Priority: Medium  . ADJUSTMENT DISORDER WITH ANXIOUS MOOD 01/15/2009    Priority: Medium  . Hypothyroidism 04/05/2008    Priority: Medium  . IBS (irritable bowel syndrome) 04/05/2008    Priority: Medium  . Lumbar pain with radiation down left leg 02/21/2011    Priority: Low  . CERUMEN IMPACTION, BILATERAL 11/27/2010    Priority: Low  . GERD 04/05/2008    Priority: Low  . INTERNAL HEMORRHOIDS 06/29/2002    Priority: Low  . Intracranial vascular stenosis 07/02/2015    Medications- reviewed and updated Current Outpatient Prescriptions  Medication Sig Dispense Refill  . alendronate (FOSAMAX) 70 MG tablet Take 1 tablet (70 mg total) by mouth every 7 (seven) days. Take with a full glass of water on an empty stomach. 4 tablet 11  . amLODipine (NORVASC) 5 MG tablet TAKE 1 TABLET BY MOUTH ONCE DAILY 90 tablet 1  . Apoaequorin (PREVAGEN PO) Take 20 mg by mouth.    . Calcium Carb-Cholecalciferol (CALCIUM-VITAMIN D3) 600-400 MG-UNIT TABS Take 1 tablet by mouth daily.    . clopidogrel (PLAVIX) 75 MG tablet TAKE 1 TABLET BY MOUTH  DAILY 90 tablet 1  . CRANBERRY CONCENTRATE PO Take 1 tablet by mouth daily.    Marland Kitchen escitalopram (LEXAPRO) 5 MG tablet Take 1 tablet (5 mg total) by mouth  daily. 30 tablet 3  . Lactobacillus (ACIDOPHILUS) 10 MG CAPS Take 1 capsule by mouth daily.     Marland Kitchen LORazepam (ATIVAN) 0.5 MG tablet Take 0.5 tablets (0.25 mg total) by mouth at bedtime as needed for anxiety. 45 tablet 1  . nitroGLYCERIN (NITROSTAT) 0.4 MG SL tablet Place 0.4 mg under the tongue every 5 (five) minutes as needed for chest pain. Reported on 03/24/2016    . Omega-3 Fatty Acids (FISH OIL) 1000 MG CAPS Take 1 capsule by mouth daily.    Marland Kitchen PRALUENT 75 MG/ML SOPN INJECT SUBCUTANEOUSLY 75MG  EVERY TWO WEEKS 6 pen 3  . Probiotic Product (ALIGN PO) Take 1 capsule by mouth daily.    Marland Kitchen pyridOXINE (B-6) 50 MG tablet Take 50 mg by mouth daily.    . ranitidine (ZANTAC) 150 MG tablet     . SYNTHROID 100 MCG tablet TAKE 1 TABLET BY MOUTH  DAILY 90 tablet 3  . vitamin B-12 (CYANOCOBALAMIN) 500 MCG tablet Take 500 mcg by mouth daily.     . vitamin E 400 UNIT capsule Take 400 Units by mouth daily.     No current facility-administered medications for this visit.     Objective: BP (!) 128/56 (BP Location: Left Arm, Patient Position: Sitting, Cuff Size: Large)   Pulse 78   Temp 97.7 F (36.5 C) (Oral)   Ht 4\' 11"  (1.499 m)  Wt 138 lb 9.6 oz (62.9 kg)   LMP  (LMP Unknown)   SpO2 95%   BMI 27.99 kg/m  Gen: NAD, resting comfortably CV: RRR no murmurs rubs or gallops Lungs: CTAB no crackles, wheeze, rhonchi Abdomen: soft/nontender/nondistended/normal bowel sounds.  Ext: trace edema Skin: warm, dry Neuro: grossly normal, moves all extremities, confused at times  Assessment/Plan:  Overactive bladder S: discontinued oxybutynin last viist due to alzheimers diagnosis and confusion. Worsening incontinence.  A/P: No great options for her due to cardiac history. Continue without new rx. Did not seem to help with confusion as reportedly mildly worse  Essential hypertension S: controlled on amlodipine 5mg . Had decreased to on atenolol 12.5mg  BID (down from 25mg ) but this was later stopped by  cardiology BP Readings from Last 3 Encounters:  08/13/17 (!) 128/56  07/13/17 (!) 120/50  07/07/17 (!) 136/56  A/P: We discussed blood pressure goal of <140/90. Continue current meds:  Considered decreasing to 2.5mg  amlodipine but will trend at current bp for now  ADJUSTMENT DISORDER WITH ANXIOUS MOOD S: had tried zoloft fro anxiety but felt shaky and worse on zoloft. Had sent in lexapro but had trip to [place] so didn't want to start at last visit. When anxiety is worse- feels shaky and like memory not as sharp. Still feels like when anxiety or memory is worse that food improves this. We had encouraged her keeping calories in and using beverages like ensure or gatorade to help   She started lexapro on the 10th (about 2 weeks ago). Anxiety has improved slightly. Sparing ativan. Anxiety is improved but reports Memory seems slightly worse.   Family has hired someone to come 2 mornings a week and that helps A/P: increase lexapro to 10mg  from 5mg  and follow up in 6 weeks  Had more SOB when saw cardiology- upcoming echocardiogram with them.  6 weeks.   Meds ordered this encounter  Medications  . DISCONTD: ranitidine (ZANTAC) 150 MG tablet  . escitalopram (LEXAPRO) 10 MG tablet    Sig: Take 1 tablet (10 mg total) by mouth daily.    Dispense:  30 tablet    Refill:  5  . ranitidine (ZANTAC) 150 MG tablet    Sig: Take 1 tablet (150 mg total) by mouth 2 (two) times daily. Temporary fill while waiting on mail order    Dispense:  15 tablet    Refill:  0    Return precautions advised.  Tana Conch, MD

## 2017-08-13 NOTE — Assessment & Plan Note (Signed)
S: controlled on amlodipine 5mg . Had decreased to on atenolol 12.5mg  BID (down from 25mg ) but this was later stopped by cardiology BP Readings from Last 3 Encounters:  08/13/17 (!) 128/56  07/13/17 (!) 120/50  07/07/17 (!) 136/56  A/P: We discussed blood pressure goal of <140/90. Continue current meds:  Considered decreasing to 2.5mg  amlodipine but will trend at current bp for now

## 2017-08-14 ENCOUNTER — Other Ambulatory Visit: Payer: Self-pay

## 2017-08-14 ENCOUNTER — Ambulatory Visit (HOSPITAL_COMMUNITY): Payer: Medicare Other | Attending: Cardiovascular Disease

## 2017-08-14 ENCOUNTER — Ambulatory Visit (INDEPENDENT_AMBULATORY_CARE_PROVIDER_SITE_OTHER): Payer: Medicare Other

## 2017-08-14 ENCOUNTER — Ambulatory Visit: Payer: Medicare Other | Admitting: Family Medicine

## 2017-08-14 ENCOUNTER — Other Ambulatory Visit: Payer: Self-pay | Admitting: Neurology

## 2017-08-14 DIAGNOSIS — R079 Chest pain, unspecified: Secondary | ICD-10-CM | POA: Diagnosis not present

## 2017-08-14 DIAGNOSIS — G459 Transient cerebral ischemic attack, unspecified: Secondary | ICD-10-CM | POA: Diagnosis not present

## 2017-08-14 DIAGNOSIS — I348 Other nonrheumatic mitral valve disorders: Secondary | ICD-10-CM | POA: Insufficient documentation

## 2017-08-14 DIAGNOSIS — G309 Alzheimer's disease, unspecified: Secondary | ICD-10-CM

## 2017-08-14 DIAGNOSIS — I10 Essential (primary) hypertension: Secondary | ICD-10-CM | POA: Insufficient documentation

## 2017-08-14 DIAGNOSIS — R001 Bradycardia, unspecified: Secondary | ICD-10-CM | POA: Diagnosis not present

## 2017-08-14 DIAGNOSIS — I2510 Atherosclerotic heart disease of native coronary artery without angina pectoris: Secondary | ICD-10-CM | POA: Insufficient documentation

## 2017-08-14 DIAGNOSIS — Z8249 Family history of ischemic heart disease and other diseases of the circulatory system: Secondary | ICD-10-CM | POA: Diagnosis not present

## 2017-08-14 DIAGNOSIS — G4733 Obstructive sleep apnea (adult) (pediatric): Secondary | ICD-10-CM | POA: Diagnosis not present

## 2017-08-14 DIAGNOSIS — R0602 Shortness of breath: Secondary | ICD-10-CM | POA: Insufficient documentation

## 2017-08-14 DIAGNOSIS — E785 Hyperlipidemia, unspecified: Secondary | ICD-10-CM | POA: Diagnosis not present

## 2017-08-14 DIAGNOSIS — Z87891 Personal history of nicotine dependence: Secondary | ICD-10-CM | POA: Diagnosis not present

## 2017-08-16 ENCOUNTER — Encounter: Payer: Self-pay | Admitting: Physician Assistant

## 2017-08-21 ENCOUNTER — Other Ambulatory Visit: Payer: Medicare Other

## 2017-08-21 ENCOUNTER — Ambulatory Visit
Admission: RE | Admit: 2017-08-21 | Discharge: 2017-08-21 | Disposition: A | Discharge status: Home / Self Care | Payer: No Typology Code available for payment source | Source: Ambulatory Visit | Attending: Neurology | Admitting: Neurology

## 2017-08-21 DIAGNOSIS — G309 Alzheimer's disease, unspecified: Secondary | ICD-10-CM

## 2017-08-27 ENCOUNTER — Other Ambulatory Visit: Payer: Self-pay | Admitting: Family Medicine

## 2017-08-27 DIAGNOSIS — M810 Age-related osteoporosis without current pathological fracture: Secondary | ICD-10-CM

## 2017-08-28 ENCOUNTER — Other Ambulatory Visit: Payer: Self-pay

## 2017-08-28 MED ORDER — RANITIDINE HCL 150 MG PO TABS: 150.0000 mg | ORAL_TABLET | Freq: Two times a day (BID) | ORAL | 1 refills | Status: DC

## 2017-09-02 ENCOUNTER — Telehealth: Payer: Self-pay | Admitting: *Deleted

## 2017-09-02 NOTE — Telephone Encounter (Signed)
Tried to reach pt to go over monitor results, no answer.

## 2017-09-02 NOTE — Telephone Encounter (Signed)
-----   Message from Scott T Weaver, PA-C sent at 09/02/2017  4:37 PM EDT ----- Please call the patient The Holter monitor showed normal sinus rhythm, occasional premature beats (these are benign) and no significant arrhythmias. Continue with current treatment plan. Scott Weaver, PA-C   09/02/2017 4:36 PM 

## 2017-09-03 NOTE — Telephone Encounter (Signed)
-----   Message from Beatrice LecherScott T Weaver, [place]PA-C sent at 09/02/2017  4:37 PM EDT ----- Please call the patient The Holter monitor showed normal sinus rhythm, occasional premature beats (these are benign) and no significant arrhythmias. Continue with current treatment plan. Tereso NewcomerScott Weaver, PA-C   09/02/2017 4:36 PM

## 2017-09-03 NOTE — Telephone Encounter (Signed)
Pt has been notified of monitor results by phone with verbal understanding. Pt thanked me for my call today.

## 2017-09-04 ENCOUNTER — Ambulatory Visit: Payer: Medicare Other | Admitting: Physician Assistant

## 2017-09-08 NOTE — Progress Notes (Signed)
Made in error

## 2017-09-09 ENCOUNTER — Other Ambulatory Visit: Payer: Self-pay | Admitting: Neurology

## 2017-09-09 DIAGNOSIS — G309 Alzheimer's disease, unspecified: Secondary | ICD-10-CM

## 2017-09-16 ENCOUNTER — Telehealth: Payer: Self-pay | Admitting: Family Medicine

## 2017-09-16 NOTE — Telephone Encounter (Signed)
[facility] and Palliative Care [phone] needs a order for a palliative care nurse to go out to the patients house to do a assesement.

## 2017-09-17 NOTE — Telephone Encounter (Signed)
Called and spoke with daughter who was requesting pallative care nurse. She states they want the nurse to assess memory. Would this fall under [facility] or Pallative? Please advise

## 2017-09-17 NOTE — Telephone Encounter (Signed)
Yes thanks, palliative care should be ok- appears that's what hospice is requesting.

## 2017-09-18 ENCOUNTER — Other Ambulatory Visit: Payer: Self-pay

## 2017-09-18 DIAGNOSIS — R413 Other amnesia: Secondary | ICD-10-CM

## 2017-09-18 NOTE — Telephone Encounter (Signed)
Referral placed.

## 2017-09-22 ENCOUNTER — Encounter: Payer: Self-pay | Admitting: Physician Assistant

## 2017-09-22 ENCOUNTER — Ambulatory Visit (INDEPENDENT_AMBULATORY_CARE_PROVIDER_SITE_OTHER): Payer: Medicare Other | Admitting: Physician Assistant

## 2017-09-22 ENCOUNTER — Ambulatory Visit
Admission: RE | Admit: 2017-09-22 | Discharge: 2017-09-22 | Disposition: A | Discharge status: Home / Self Care | Payer: No Typology Code available for payment source | Source: Ambulatory Visit | Attending: Neurology | Admitting: Neurology

## 2017-09-22 ENCOUNTER — Other Ambulatory Visit: Payer: Medicare Other

## 2017-09-22 VITALS — BP 144/54 | HR 64 | Ht 59.0 in | Wt 136.4 lb

## 2017-09-22 DIAGNOSIS — I10 Essential (primary) hypertension: Secondary | ICD-10-CM

## 2017-09-22 DIAGNOSIS — R001 Bradycardia, unspecified: Secondary | ICD-10-CM

## 2017-09-22 DIAGNOSIS — I2510 Atherosclerotic heart disease of native coronary artery without angina pectoris: Secondary | ICD-10-CM | POA: Diagnosis not present

## 2017-09-22 DIAGNOSIS — I951 Orthostatic hypotension: Secondary | ICD-10-CM | POA: Diagnosis not present

## 2017-09-22 DIAGNOSIS — G309 Alzheimer's disease, unspecified: Secondary | ICD-10-CM

## 2017-09-22 NOTE — Progress Notes (Signed)
Cardiology Office Note:    Date:  09/22/2017   ID:  Joyce [name], DOB Oct 11, 1931, MRN [id number]  PCP:  Joyce Olp, MD  Cardiologist:  Dr. Sherren [name]    Referring MD: Joyce Olp, MD   Chief Complaint  Patient presents with  . Follow-up    recent echo, holter monitor     History of Present Illness:    Joyce [name] is a 83 y.o. female with a hx of CAD s/p PCI to the LCx in 2002, prior stroke, HTN, HL, GERD, hypothyroidism. She is intolerant to statins and has been treated with PCSK-9 inhibitor therapy for hyperlipidemia. She was placed on Plavix at the time of her stroke.  I saw her 08/04/17 with symptoms of weakness, dyspnea, near syncope and chest discomfort. Her blood pressure machine did record a heart rate in the 30s. She reduced her atenolol with some improvement in her symptoms.  I stopped her atenolol. She did have evidence of orthostatic hypotension. I encouraged her to resume wearing her compression stockings. 48 hour Holter monitor demonstrated sinus rhythm with no significant pauses, minimal heart rate of 47. She had no significant arrhythmias noted. An echocardiogram was obtained and demonstrated normal LV function.  Joyce [name] returns for follow up.  She is feeling better since last seen.  Her daughter wonders if anxiety was playing a role.  She is now on Lexapro with improved mood.  She has not had any chest pain or significant shortness of breath.  She has not had paroxysmal nocturnal dyspnea, syncope or significant leg edema.    Prior CV studies:   The following studies were reviewed today:  48 hour Holter monitor 08/14/17 NSR, occasional PVCs/PACs, no sustained arrhythmia or atrial fibrillation, short supraventricular runs Minimal heart rate 47; maximal heart rate 108 No significant pauses  Echocardiogram 08/14/17 EF 60-65, normal wall motion, normal diastolic function, MAC  Echo 5/16 EF 60, normal wall motion, normal RVSF  Myoview 9/15 Low  risk stress nuclear study with a small area mild severity ischemia in the distal LAD (SDS 2). LV Ejection Fraction: 71%.  Echo 12/14 Vigorous LVF, EF 65-70, normal wall motion  Carotid US 12/14 Bilateral ICA 1-39  Myoview 8/13 Normal stress nuclear study. EF 81.  Myoview 02/2009:  EF 83%, no scar or ischemia.    LHC 12/2002:  Proximal LAD 50%, proximal circumflex stents patent, OM1 proximal 30%, mid RCA 30%, EF 70%.   Past Medical History:  Diagnosis Date  . Adjustment disorder with anxiety STRESS CHEST DISCOMFORT   PT RECENTLY PUT HUSBAND IN NURSING HOME  . Borderline glaucoma   . Candidiasis of skin and nails   . Coronary atherosclerosis of unspecified type of vessel, native or graft CARDIOLOGIST- DR [facility]   DENIES S & S  . GERD (gastroesophageal reflux disease)   . History of echocardiogram    Echo 8/18: EF 60-65, no RWMA, MAC  . History of sleep apnea YRS AGO ON CPAP UNTIL LOST WT  . Hyperlipidemia   . Hypothyroidism   . IBS (irritable bowel syndrome)   . Internal hemorrhoids without mention of complication   . Lesion of bladder   . Nocturia   . Numbness and tingling of foot LEFT -- SECONDARY TO PINCHED LUMBAR NERVE  . Pinched nerve LUMBAR   RESIDUAL LEFT FOOT NUMBNESS/ TINGLING  . Scoliosis   . Sinusitis 09/29/2013  . Stress incontinence   . Unspecified essential hypertension     Past Surgical History:  Procedure Laterality Date  . CARDIAC CATHETERIZATION  01-10-2003   DR Gwyndolyn Saxon DOWNEY   PATENT CIRCUMFLEX STENT/ MODERATE CAD ELSEWHERE  . CARDIOVASCULAR STRESS TEST  07-27-2012   NORMAL NUCLEAR STUDY/ LVEF 81%  . CATARACT EXTRACTION W/ INTRAOCULAR LENS  IMPLANT, BILATERAL    . CHOLECYSTECTOMY  1976  . CORONARY ANGIOPLASTY WITH STENT PLACEMENT  08-31-2001   DR Darnell Level BRODIE   STENTING OF PROXIMAL CIRCUMFLEX (75% TO 0%)   . CYSTOSCOPY WITH BIOPSY  08/02/2012   Procedure: CYSTOSCOPY WITH BIOPSY;  Surgeon: Molli Hazard, MD;  Location: [facility];  Service: Urology;  Laterality: N/A;  . VAGINAL HYSTERECTOMY  1968    Current Medications: Current Meds  Medication Sig  . alendronate (FOSAMAX) 70 MG tablet TAKE ONE TABLET BY MOUTH ONCE A WEEK. TAKE WITH A FULL GLASS OF WATER ON AN EMPTY STOMACH.  Marland Kitchen amLODipine (NORVASC) 5 MG tablet TAKE 1 TABLET BY MOUTH ONCE DAILY  . Apoaequorin (PREVAGEN PO) Take 20 mg by mouth.  . Calcium Carb-Cholecalciferol (CALCIUM-VITAMIN D3) 600-400 MG-UNIT TABS Take 1 tablet by mouth daily.  . clopidogrel (PLAVIX) 75 MG tablet TAKE 1 TABLET BY MOUTH  DAILY  . CRANBERRY CONCENTRATE PO Take 1 tablet by mouth daily.  Marland Kitchen escitalopram (LEXAPRO) 10 MG tablet Take 1 tablet (10 mg total) by mouth daily.  . nitroGLYCERIN (NITROSTAT) 0.4 MG SL tablet Place 0.4 mg under the tongue every 5 (five) minutes as needed for chest pain. Reported on 03/24/2016  . Omega-3 Fatty Acids (FISH OIL) 1000 MG CAPS Take 1 capsule by mouth daily.  Marland Kitchen PRALUENT 75 MG/ML SOPN INJECT SUBCUTANEOUSLY 75MG EVERY TWO WEEKS  . Probiotic Product (ALIGN PO) Take 1 capsule by mouth daily.  Marland Kitchen pyridOXINE (B-6) 50 MG tablet Take 50 mg by mouth daily.  . ranitidine (ZANTAC) 150 MG tablet Take 1 tablet (150 mg total) by mouth 2 (two) times daily.  Marland Kitchen SYNTHROID 100 MCG tablet TAKE 1 TABLET BY MOUTH  DAILY  . vitamin B-12 (CYANOCOBALAMIN) 500 MCG tablet Take 500 mcg by mouth daily.   . vitamin E 400 UNIT capsule Take 400 Units by mouth daily.     Allergies:   Loratadine; Zoloft [sertraline hcl]; Adhesive [tape]; Aspirin; Levaquin [levofloxacin in d5w]; Levofloxacin; Lumigan [bimatoprost]; Mobic [meloxicam]; Penicillins; and Statins   Social History   Social History  . Marital status: Married    Spouse name: N/A  . Number of children: N/A  . Years of education: N/A   Occupational History  . retired Retired   Social History Main Topics  . Smoking status: Former Smoker    Packs/day: 1.50    Years: 20.00    Types: Cigarettes    Quit  date: 07/31/1983  . Smokeless tobacco: Never Used  . Alcohol use No  . Drug use: No  . Sexual activity: No   Other Topics Concern  . None   Social History Narrative   Widowed December 2014. Married just shy of 61 years. 3 kids (1 daughter that has essentially left the family x 3 yers). 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       Hobbies: baking, cooking, sewing, planning on water aerobics, active in church ([employer] [employer]     Family Hx: The patient's family history includes CAD in her father; Heart disease in her mother.  ROS:   Please see the history of present  illness.    Review of Systems  Cardiovascular: Positive for dyspnea on exertion.   All other systems reviewed and are negative.   EKGs/Labs/Other Test Reviewed:    EKG:  EKG is not ordered today.  The ekg ordered today demonstrates n/a  Recent Labs: 07/13/2017: ALT 16; BUN 10; Creatinine, Ser 0.69; Hemoglobin 15.4; Platelets 288.0; Potassium 3.7; Sodium 138; TSH 4.19   Recent Lipid Panel Lab Results  Component Value Date/Time   CHOL 112 (L) 11/14/2015 08:01 AM   TRIG 64 11/14/2015 08:01 AM   HDL 56 11/14/2015 08:01 AM   CHOLHDL 2.0 11/14/2015 08:01 AM   LDLCALC 43 11/14/2015 08:01 AM   LDLDIRECT 45.0 02/17/2017 12:06 PM    Physical Exam:    VS:  BP (!) 144/54   Pulse 64   Ht _0  (1.499 m)   Wt 136 lb 6.4 oz (61.9 kg)   LMP  (LMP Unknown)   SpO2 96%   BMI 27.55 kg/m     Wt Readings from Last 3 Encounters:  09/22/17 136 lb 6.4 oz (61.9 kg)  08/13/17 138 lb 9.6 oz (62.9 kg)  08/04/17 138 lb 9.6 oz (62.9 kg)     Physical Exam  Constitutional: She is oriented to person, place, and time. She appears well-developed and well-nourished. No distress.  HENT:  Head: Normocephalic and atraumatic.  Eyes: No scleral icterus.  Neck: No JVD present.  Cardiovascular: Normal rate and regular rhythm.   No murmur  heard. Pulmonary/Chest: Effort normal. She has no rales.  Abdominal: Soft.  Musculoskeletal: She exhibits no edema.  Neurological: She is alert and oriented to person, place, and time.  Skin: Skin is warm and dry.  Psychiatric: She has a normal mood and affect.    ASSESSMENT:    1. Bradycardia   2. Orthostatic hypotension   3. Coronary artery disease involving native coronary artery of native heart without angina pectoris   4. Essential hypertension    PLAN:    In order of problems listed above:  1. Bradycardia Her HR is improved off beta-blocker therapy.  She did not have any significant arrhythmias or pauses on recent Holter monitor.  She did complain of shortness of breath at last visit and an echocardiogram demonstrated normal ejection fraction.  No further workup needed at this time.   2. Orthostatic hypotension Continue bilateral lower ext compression stockings.  3. Coronary artery disease involving native coronary artery of native heart without angina pectoris  S/p PCI to LCx in 2002 and low risk Myoview in 2015.  She has not had further chest pain.  Continue Praluent, Clopidogrel.  No beta-blocker due to bradycardia.   4. Essential hypertension The patient's blood pressure is controlled on her current regimen.  Continue current therapy.    Dispo:  Return in about 6 months (around 03/23/2018) for Routine Follow Up, w/ Dr. Burt Knack.   Medication Adjustments/Labs and Tests Ordered: Current medicines are reviewed at length with the patient today.  Concerns regarding medicines are outlined above.  Tests Ordered: Orders Placed This Encounter  Procedures  . EKG 12-Lead   Medication Changes: No orders of the defined types were placed in this encounter.   Signed, Richardson Dopp, PA-C  09/22/2017 11:38 AM    [facility] [place], [place], [place]  [address] Phone: [phone]; Fax: [phone]

## 2017-09-22 NOTE — Patient Instructions (Signed)
Medication Instructions:  1. Your physician recommends that you continue on your current medications as directed. Please refer to the Current Medication list given to you today.   Labwork: NONE ORDERED TODAY  Testing/Procedures: NONE ORDERED TODAY  Follow-Up: Your physician wants you to follow-up in: 6 MONTHS WITH DR. COOPER  You will receive a reminder letter in the mail two months in advance. If you don't receive a letter, please call our office to schedule the follow-up appointment.   Any Other Special Instructions Will Be Listed Below (If Applicable).     If you need a refill on your cardiac medications before your next appointment, please call your pharmacy.   

## 2017-09-25 ENCOUNTER — Telehealth: Payer: Self-pay

## 2017-09-25 NOTE — Telephone Encounter (Signed)
Called and spoke to Joyce [name] and placed a referral to Pallative care.

## 2017-10-09 DIAGNOSIS — R531 Weakness: Secondary | ICD-10-CM | POA: Diagnosis not present

## 2017-10-20 NOTE — Progress Notes (Signed)
Pre visit review using our clinic review tool, if applicable. No additional management support is needed unless otherwise documented below in the visit note. 

## 2017-10-20 NOTE — Telephone Encounter (Signed)
Order faxed to [facility] as requested

## 2017-10-20 NOTE — Progress Notes (Signed)
PCP notes:   Health maintenance: None.    Abnormal screenings: MMS score 23   Patient concerns:  Pt is going to stop taking Prevagen. Pt would like to know if she is supposed to take Calcium BID? Vitamin D/Prohlia?  Zantac BID?   Nurse concerns: No.   Next PCP appt: 10/22/17 10:15

## 2017-10-20 NOTE — Telephone Encounter (Signed)
Kathie RhodesBetty, RN;fax an oder for OT and PT eval.   [facility](patient needs orders for PT & OT) FAX: [phone]  Ty,  -LL

## 2017-10-20 NOTE — Progress Notes (Signed)
Subjective:   Joyce [name] is a 83 y.o. female who presents for Medicare Annual (Subsequent) preventive examination.  Review of Systems:  No ROS.  Medicare Wellness Visit. Additional risk factors are reflected in the social history.  Cardiac Risk Factors include: advanced age (>48mn, >>45women);dyslipidemia;hypertension;sedentary lifestyle     Objective:     Vitals: BP (!) 110/56 (BP Location: Right Arm, Patient Position: Sitting, Cuff Size: Normal)   Pulse 68   Temp 97.7 F (36.5 C) (Oral)   Resp 16   Ht 4' 11"  (1.499 m)   Wt 134 lb (60.8 kg)   LMP  (LMP Unknown)   SpO2 98%   BMI 27.06 kg/m   Body mass index is 27.06 kg/m.   Tobacco History  Smoking Status  . Former Smoker  . Packs/day: 1.50  . Years: 20.00  . Types: Cigarettes  . Quit date: 07/31/1983  Smokeless Tobacco  . Never Used     Counseling given: Not Answered   Past Medical History:  Diagnosis Date  . Adjustment disorder with anxiety STRESS CHEST DISCOMFORT   PT RECENTLY PUT HUSBAND IN NURSING HOME  . Borderline glaucoma   . Candidiasis of skin and nails   . Coronary atherosclerosis of unspecified type of vessel, native or graft [facility]  DENIES S & S  . GERD (gastroesophageal reflux disease)   . History of echocardiogram    Echo 8/18: EF 60-65, no RWMA, MAC  . History of sleep apnea YRS AGO ON CPAP UNTIL LOST WT  . Hyperlipidemia   . Hypothyroidism   . IBS (irritable bowel syndrome)   . Internal hemorrhoids without mention of complication   . Lesion of bladder   . Nocturia   . Numbness and tingling of foot LEFT -- SECONDARY TO PINCHED LUMBAR NERVE  . Pinched nerve LUMBAR   RESIDUAL LEFT FOOT NUMBNESS/ TINGLING  . Scoliosis   . Sinusitis 09/29/2013  . Stress incontinence   . Unspecified essential hypertension    Past Surgical History:  Procedure Laterality Date  . CARDIAC CATHETERIZATION  01-10-2003   DR WGwyndolyn SaxonDOWNEY   PATENT CIRCUMFLEX STENT/ MODERATE CAD  ELSEWHERE  . CARDIOVASCULAR STRESS TEST  07-27-2012   NORMAL NUCLEAR STUDY/ LVEF 81%  . CATARACT EXTRACTION W/ INTRAOCULAR LENS  IMPLANT, BILATERAL    . CHOLECYSTECTOMY  1976  . CORONARY ANGIOPLASTY WITH STENT PLACEMENT  08-31-2001   DR BDarnell LevelBRODIE   STENTING OF PROXIMAL CIRCUMFLEX (75% TO 0%)   . CYSTOSCOPY WITH BIOPSY  08/02/2012   Procedure: CYSTOSCOPY WITH BIOPSY;  Surgeon: DMolli Hazard MD;  [facility]  Service: Urology;  Laterality: N/A;  . VAGINAL HYSTERECTOMY  1968   Family History  Problem Relation Age of Onset  . Heart disease Mother        per patient died of old age at 872 . CAD Father        massive MI age 81  History  Sexual Activity  . Sexual activity: No    Outpatient Encounter Prescriptions as of 10/22/2017  Medication Sig  . alendronate (FOSAMAX) 70 MG tablet TAKE ONE TABLET BY MOUTH ONCE A WEEK. TAKE WITH A FULL GLASS OF WATER ON AN EMPTY STOMACH.  .Marland KitchenamLODipine (NORVASC) 5 MG tablet TAKE 1 TABLET BY MOUTH ONCE DAILY  . Calcium Carb-Cholecalciferol (CALCIUM-VITAMIN D3) 600-400 MG-UNIT TABS Take 1 tablet by mouth daily.  . clopidogrel (PLAVIX) 75 MG tablet TAKE 1 TABLET BY MOUTH  DAILY  . CRANBERRY CONCENTRATE PO Take 1 tablet by mouth daily.  Marland Kitchen escitalopram (LEXAPRO) 10 MG tablet Take 1 tablet (10 mg total) by mouth daily.  . nitroGLYCERIN (NITROSTAT) 0.4 MG SL tablet Place 0.4 mg under the tongue every 5 (five) minutes as needed for chest pain. Reported on 03/24/2016  . Omega-3 Fatty Acids (FISH OIL) 1000 MG CAPS Take 1 capsule by mouth daily.  Marland Kitchen PRALUENT 75 MG/ML SOPN INJECT SUBCUTANEOUSLY 75MG EVERY TWO WEEKS  . Probiotic Product (ALIGN PO) Take 1 capsule by mouth daily.  Marland Kitchen pyridOXINE (B-6) 50 MG tablet Take 50 mg by mouth daily.  . ranitidine (ZANTAC) 150 MG tablet Take 1 tablet (150 mg total) by mouth 2 (two) times daily.  Marland Kitchen SYNTHROID 100 MCG tablet TAKE 1 TABLET BY MOUTH  DAILY  . vitamin B-12 (CYANOCOBALAMIN) 500 MCG  tablet Take 500 mcg by mouth daily.   . vitamin E 400 UNIT capsule Take 400 Units by mouth daily.  Marland Kitchen Apoaequorin (PREVAGEN PO) Take 20 mg by mouth daily.    No facility-administered encounter medications on file as of 10/22/2017.     Activities of Daily Living In your present state of health, do you have any difficulty performing the following activities: 10/22/2017  Hearing? N  Vision? N  Difficulty concentrating or making decisions? N  Walking or climbing stairs? N  Dressing or bathing? N  Doing errands, shopping? N  Preparing Food and eating ? N  Using the Toilet? N  In the past six months, have you accidently leaked urine? N  Do you have problems with loss of bowel control? N  Managing your Medications? Y  Managing your Finances? Y  Housekeeping or managing your Housekeeping? Y  Some recent data might be hidden    Patient Care Team: Marin Olp, MD as PCP - General (Family Medicine) Sherren Mocha, MD as Attending Physician (Cardiology) Warden Fillers, MD as Consulting Physician (Ophthalmology) Garvin Fila, MD as Consulting Physician (Neurology)    Assessment:    Physical assessment deferred to PCP.  Exercise Activities and Dietary recommendations Current Exercise Habits: The patient does not participate in regular exercise at present, Exercise limited by: None identified  Goals    . Exercise 150 minutes per week (moderate activity)          Continue the water aerobics Drink more water   Use an exercise band at home / start low and slow; if it makes shoulders worse stop      Fall Risk Fall Risk  10/22/2017 05/27/2017 10/17/2016 08/14/2016 03/27/2015  Falls in the past year? No No No No No   Depression Screen PHQ 2/9 Scores 10/22/2017 10/17/2016 08/14/2016 05/19/2015  PHQ - 2 Score 0 0 0 0  PHQ- 9 Score - - - -     Cognitive Function MMSE - Mini Mental State Exam 10/22/2017 09/08/2017 10/17/2016  Not completed: - - (No Data)  Orientation to time 0 5 -   Orientation to Place 5 5 -  Registration 3 3 -  Attention/ Calculation 5 5 -  Recall 1 1 -  Language- name 2 objects 2 2 -  Language- repeat 1 1 -  Language- follow 3 step command 3 3 -  Language- read & follow direction 1 1 -  Write a sentence 1 1 -  Copy design 1 1 -  Total score 23 28 -        Immunization History  Administered Date(s) Administered  . Influenza Split  09/15/2011, 10/05/2012  . Influenza Whole 10/26/2009, 09/21/2010  . Influenza, High Dose Seasonal PF 10/11/2017  . Influenza,inj,Quad PF,6+ Mos 10/25/2013, 09/25/2014, 10/02/2015, 08/14/2016  . Pneumococcal Conjugate-13 03/27/2015  . Pneumococcal Polysaccharide-23 08/14/2016  . Tdap 07/22/2012  . Zoster 06/09/2011   Screening Tests Health Maintenance  Topic Date Due  . TETANUS/TDAP  07/22/2022  . INFLUENZA VACCINE  Completed  . DEXA SCAN  Completed  . PNA vac Low Risk Adult  Completed      Plan:   Follow up with PCP as directed.  I have personally reviewed and noted the following in the patient's chart:   . Medical and social history . Use of alcohol, tobacco or illicit drugs  . Current medications and supplements . Functional ability and status . Nutritional status . Physical activity . Advanced directives . List of other physicians . Vitals . Screenings to include cognitive, depression, and falls . Referrals and appointments  In addition, I have reviewed and discussed with patient certain preventive protocols, quality metrics, and best practice recommendations. A written personalized care plan for preventive services as well as general preventive health recommendations were provided to patient.     Williemae Area, RN  10/22/2017

## 2017-10-22 ENCOUNTER — Encounter: Payer: Self-pay | Admitting: Family Medicine

## 2017-10-22 ENCOUNTER — Ambulatory Visit: Payer: Medicare Other | Admitting: *Deleted

## 2017-10-22 ENCOUNTER — Encounter: Payer: Self-pay | Admitting: *Deleted

## 2017-10-22 ENCOUNTER — Ambulatory Visit (INDEPENDENT_AMBULATORY_CARE_PROVIDER_SITE_OTHER): Payer: Medicare Other | Admitting: Family Medicine

## 2017-10-22 VITALS — BP 110/56 | HR 68 | Temp 97.7°F | Resp 16 | Ht 59.0 in | Wt 134.0 lb

## 2017-10-22 VITALS — BP 110/56 | HR 68 | Temp 97.7°F | Ht 59.0 in | Wt 134.0 lb

## 2017-10-22 DIAGNOSIS — E785 Hyperlipidemia, unspecified: Secondary | ICD-10-CM | POA: Diagnosis not present

## 2017-10-22 DIAGNOSIS — I10 Essential (primary) hypertension: Secondary | ICD-10-CM

## 2017-10-22 DIAGNOSIS — F0280 Dementia in other diseases classified elsewhere without behavioral disturbance: Secondary | ICD-10-CM | POA: Diagnosis not present

## 2017-10-22 DIAGNOSIS — M810 Age-related osteoporosis without current pathological fracture: Secondary | ICD-10-CM

## 2017-10-22 DIAGNOSIS — Z0000 Encounter for general adult medical examination without abnormal findings: Secondary | ICD-10-CM

## 2017-10-22 DIAGNOSIS — F4322 Adjustment disorder with anxiety: Secondary | ICD-10-CM | POA: Diagnosis not present

## 2017-10-22 DIAGNOSIS — K219 Gastro-esophageal reflux disease without esophagitis: Secondary | ICD-10-CM | POA: Diagnosis not present

## 2017-10-22 DIAGNOSIS — G309 Alzheimer's disease, unspecified: Secondary | ICD-10-CM

## 2017-10-22 HISTORY — DX: Alzheimer's disease, unspecified: G30.9

## 2017-10-22 HISTORY — DX: Dementia in other diseases classified elsewhere, unspecified severity, without behavioral disturbance, psychotic disturbance, mood disturbance, and anxiety: F02.80

## 2017-10-22 LAB — VITAMIN D 25 HYDROXY (VIT D DEFICIENCY, FRACTURES): VITD: 59.37 ng/mL (ref 30.00–100.00)

## 2017-10-22 MED ORDER — ALENDRONATE SODIUM 70 MG PO TABS: ORAL_TABLET | ORAL | 3 refills | Status: DC

## 2017-10-22 NOTE — Assessment & Plan Note (Signed)
S: Patient has had anxiety issues and felt shakier on zoloft. She started lexapro 07/31/17 and then we increased to 10mg  with planned follow up.   She states her anxiety has decreased on this.  A/P: we will continue current dose of medication.

## 2017-10-22 NOTE — Assessment & Plan Note (Signed)
S[facility] Based on this now alzheimers dementia. She is going to stop prevagen which is reasonable A/P: could have vascular element with prior CVAs as well. from avs "MMSE down to 23/30. Would like to start aricept 5mg  but want to make sure this is ok with study criteria before starting. Please discuss with neurology next visit- let me know and I can send this in for you. "

## 2017-10-22 NOTE — Patient Instructions (Addendum)
MMSE down to 23/30. Would like to start aricept 5mg  but want to make sure this is ok with study criteria before starting. Please discuss with neurology next visit- let me know and I can send this in for you.   Check Vitamin D today- may need to boost this with 50,000 units  3-6 months follow up- give you some flexibility

## 2017-10-22 NOTE — Assessment & Plan Note (Signed)
S: on zantac BID for GERD. Wants to know if can trial once a day  A/P: reasonable with no breakthrough reflux- take with dinner only.

## 2017-10-22 NOTE — Assessment & Plan Note (Signed)
S:  diagnosed with osteoporosis on 09/02/16 and plan was to start fosamax as well as calcium and vitamin D- takes one twice a day.  A/P: continue current medications

## 2017-10-22 NOTE — Assessment & Plan Note (Signed)
S: controlled on amlodipine 5mg  alone. Had considered going down to 2.5mg  but note outside office reading 144 within last month.  BP Readings from Last 3 Encounters:  10/22/17 (!) 110/56  10/22/17 (!) 110/56  09/22/17 (!) 144/54  A/P:blood pressure goal of <140/90. Continue current meds. Has had BP at other office above 140 plus current lower BP will give some cushion if aricept increases BP

## 2017-10-22 NOTE — Patient Instructions (Addendum)
[name] , Thank you for taking time to come for your Medicare Wellness Visit. I appreciate your ongoing commitment to your health goals. Please review the following plan we discussed and let me know if I can assist you in the future.   These are the goals we discussed: Goals    . Exercise 150 minutes per week (moderate activity)          Continue the water aerobics Drink more water   Use an exercise band at home / start low and slow; if it makes shoulders worse stop       This is a list of the screening recommended for you and due dates:  Health Maintenance  Topic Date Due  . Tetanus Vaccine  07/22/2022  . Flu Shot  Completed  . DEXA scan (bone density measurement)  Completed  . Pneumonia vaccines  Completed   Preventive Care for Adults  A healthy lifestyle and preventive care can promote health and wellness. Preventive health guidelines for adults include the following key practices.  . A routine yearly physical is a good way to check with your health care provider about your health and preventive screening. It is a chance to share any concerns and updates on your health and to receive a thorough exam.  . Visit your dentist for a routine exam and preventive care every 6 months. Brush your teeth twice a day and floss once a day. Good oral hygiene prevents tooth decay and gum disease.  . The frequency of eye exams is based on your age, health, family medical history, use  of contact lenses, and other factors. Follow your health care provider's recommendations for frequency of eye exams.  . Eat a healthy diet. Foods like vegetables, fruits, whole grains, low-fat dairy products, and lean protein foods contain the nutrients you need without too many calories. Decrease your intake of foods high in solid fats, added sugars, and salt. Eat the right amount of calories for you. Get information about a proper diet from your health care provider, if necessary.  . Regular physical exercise  is one of the most important things you can do for your health. Most adults should get at least 150 minutes of moderate-intensity exercise (any activity that increases your heart rate and causes you to sweat) each week. In addition, most adults need muscle-strengthening exercises on 2 or more days a week.  Silver Sneakers may be a benefit available to you. To determine eligibility, you may visit the website: www.silversneakers.com or contact program at [phone])[phone] Mon-Fri between 8AM-8PM.   . Maintain a healthy weight. The body mass index (BMI) is a screening tool to identify possible weight problems. It provides an estimate of body fat based on height and weight. Your health care provider can find your BMI and can help you achieve or maintain a healthy weight.   For adults 20 years and older: ? A BMI below 18.5 is considered underweight. ? A BMI of 18.5 to 24.9 is normal. ? A BMI of 25 to 29.9 is considered overweight. ? A BMI of 30 and above is considered obese.   . Maintain normal blood lipids and cholesterol levels by exercising and minimizing your intake of saturated fat. Eat a balanced diet with plenty of fruit and vegetables. Blood tests for lipids and cholesterol should begin at age 81 and be repeated every 5 years. If your lipid or cholesterol levels are high, you are over 50, or you are at high risk for heart  disease, you may need your cholesterol levels checked more frequently. Ongoing high lipid and cholesterol levels should be treated with medicines if diet and exercise are not working.  . If you smoke, find out from your health care provider how to quit. If you do not use tobacco, please do not start.  . If you choose to drink alcohol, please do not consume more than 2 drinks per day. One drink is considered to be 12 ounces (355 mL) of beer, 5 ounces (148 mL) of wine, or 1.5 ounces (44 mL) of liquor.  . If you are 81-42 years old, ask your health care provider if you should take  aspirin to prevent strokes.  . Use sunscreen. Apply sunscreen liberally and repeatedly throughout the day. You should seek shade when your shadow is shorter than you. Protect yourself by wearing long sleeves, pants, a wide-brimmed hat, and sunglasses year round, whenever you are outdoors.  . Once a month, do a whole body skin exam, using a mirror to look at the skin on your back. Tell your health care provider of new moles, moles that have irregular borders, moles that are larger than a pencil eraser, or moles that have changed in shape or color.

## 2017-10-22 NOTE — Assessment & Plan Note (Signed)
S: statin intolerant. LDL now <70 on praluent.  Also on fish oil Lab Results  Component Value Date   CHOL 112 (L) 11/14/2015   HDL 56 11/14/2015   LDLCALC 43 11/14/2015   LDLDIRECT 45.0 02/17/2017   TRIG 64 11/14/2015   CHOLHDL 2.0 11/14/2015   A/P: continue current meds but cut fish oil

## 2017-10-22 NOTE — Progress Notes (Signed)
I have reviewed and agree with note, evaluation, plan.  See my separate note from today  Eldred Lievanos, MD  

## 2017-10-22 NOTE — Progress Notes (Signed)
Subjective:  Joyce [name] is a 83 y.o. year old very pleasant female patient who presents for/with See problem oriented charting ROS- continued confusion. Denies chest pain. Stable thin hair. No edema. No chest pain.    Past Medical History-  Patient Active Problem List   Diagnosis Date Noted  . Alzheimer's dementia 10/22/2017    Priority: High  . History of stroke     Priority: High  . History of transient ischemic attack (TIA) 05/16/2015    Priority: High  . CAD (coronary artery disease) 04/05/2008    Priority: High  . Age-related osteoporosis without current pathological fracture 10/22/2017    Priority: Medium  . High bilirubin 02/17/2017    Priority: Medium  . Hyperglycemia 08/28/2015    Priority: Medium  . Overactive bladder 08/14/2014    Priority: Medium  . Essential hypertension 01/17/2010    Priority: Medium  . Hyperlipidemia 01/15/2009    Priority: Medium  . ADJUSTMENT DISORDER WITH ANXIOUS MOOD 01/15/2009    Priority: Medium  . Hypothyroidism 04/05/2008    Priority: Medium  . IBS (irritable bowel syndrome) 04/05/2008    Priority: Medium  . Lumbar pain with radiation down left leg 02/21/2011    Priority: Low  . CERUMEN IMPACTION, BILATERAL 11/27/2010    Priority: Low  . GERD 04/05/2008    Priority: Low  . INTERNAL HEMORRHOIDS 06/29/2002    Priority: Low  . Intracranial vascular stenosis 07/02/2015    Medications- reviewed and updated Current Outpatient Prescriptions  Medication Sig Dispense Refill  . alendronate (FOSAMAX) 70 MG tablet TAKE ONE TABLET BY MOUTH ONCE A WEEK. TAKE WITH A FULL GLASS OF WATER ON AN EMPTY STOMACH. 4 tablet 11  . amLODipine (NORVASC) 5 MG tablet TAKE 1 TABLET BY MOUTH ONCE DAILY 90 tablet 1  . Apoaequorin (PREVAGEN PO) Take 20 mg by mouth daily.     . Calcium Carb-Cholecalciferol (CALCIUM-VITAMIN D3) 600-400 MG-UNIT TABS Take 1 tablet by mouth daily.    . clopidogrel (PLAVIX) 75 MG tablet TAKE 1 TABLET BY MOUTH  DAILY 90 tablet 1   . CRANBERRY CONCENTRATE PO Take 1 tablet by mouth daily.    Marland Kitchen escitalopram (LEXAPRO) 10 MG tablet Take 1 tablet (10 mg total) by mouth daily. 30 tablet 5  . nitroGLYCERIN (NITROSTAT) 0.4 MG SL tablet Place 0.4 mg under the tongue every 5 (five) minutes as needed for chest pain. Reported on 03/24/2016    . Omega-3 Fatty Acids (FISH OIL) 1000 MG CAPS Take 1 capsule by mouth daily.    Marland Kitchen PRALUENT 75 MG/ML SOPN INJECT SUBCUTANEOUSLY 75MG  EVERY TWO WEEKS 6 pen 3  . Probiotic Product (ALIGN PO) Take 1 capsule by mouth daily.    Marland Kitchen pyridOXINE (B-6) 50 MG tablet Take 50 mg by mouth daily.    . ranitidine (ZANTAC) 150 MG tablet Take 1 tablet (150 mg total) by mouth 2 (two) times daily. 180 tablet 1  . SYNTHROID 100 MCG tablet TAKE 1 TABLET BY MOUTH  DAILY 90 tablet 3  . vitamin B-12 (CYANOCOBALAMIN) 500 MCG tablet Take 500 mcg by mouth daily.     . vitamin E 400 UNIT capsule Take 400 Units by mouth daily.     No current facility-administered medications for this visit.     Objective: BP (!) 110/56   Pulse 68   Temp 97.7 F (36.5 C) (Oral)   Ht 4\' 11"  (1.499 m)   Wt 134 lb (60.8 kg)   LMP  (LMP Unknown)   SpO2  98%   BMI 27.06 kg/m  Gen: NAD, resting comfortably CV: RRR no murmurs rubs or gallops Lungs: CTAB no crackles, wheeze, rhonchi Abdomen: soft/nontender/nondistended/overweight Ext: no edema Skin: warm, dry, no rash  Assessment/Plan:  Plans to cut out cranberry pill unless recurrent UTI Plans to start MV and cut other vitamins other than calcium/vitamin D- can cut down on calcium/vitamin D if some in MV though  Hyperlipidemia S: statin intolerant. LDL now <70 on praluent.  Also on fish oil Lab Results  Component Value Date   CHOL 112 (L) 11/14/2015   HDL 56 11/14/2015   LDLCALC 43 11/14/2015   LDLDIRECT 45.0 02/17/2017   TRIG 64 11/14/2015   CHOLHDL 2.0 11/14/2015   A/P: continue current meds but cut fish oil   ADJUSTMENT DISORDER WITH ANXIOUS MOOD S: Patient has had  anxiety issues and felt shakier on zoloft. She started lexapro 07/31/17 and then we increased to 10mg  with planned follow up.   She states her anxiety has decreased on this.  A/P: we will continue current dose of medication.   Essential hypertension S: controlled on amlodipine 5mg  alone. Had considered going down to 2.5mg  but note outside office reading 144 within last month.  BP Readings from Last 3 Encounters:  10/22/17 (!) 110/56  10/22/17 (!) 110/56  09/22/17 (!) 144/54  A/P:blood pressure goal of <140/90. Continue current meds. Has had BP at other office above 140 plus current lower BP will give some cushion if aricept increases BP  Alzheimer's dementia S: [facility] Based on this now alzheimers dementia. She is going to stop prevagen which is reasonable A/P: could have vascular element with prior CVAs as well. from avs "MMSE down to 23/30. Would like to start aricept 5mg  but want to make sure this is ok with study criteria before starting. Please discuss with neurology next visit- let me know and I can send this in for you. "  GERD S: on zantac BID for GERD. Wants to know if can trial once a day  A/P: reasonable with no breakthrough reflux- take with dinner only.   Age-related osteoporosis without current pathological fracture S:  diagnosed with osteoporosis on 09/02/16 and plan was to start fosamax as well as calcium and vitamin D- takes one twice a day.  A/P: continue current medications  Future Appointments Date Time Provider Department Center  11/16/2017 3:50 PM [facility] MR 3 [facility]   3-6 months  Orders Placed This Encounter  Procedures  . VITAMIN D 25 Hydroxy (Vit-D Deficiency, Fractures)    [place]   Meds ordered this encounter  Medications  . alendronate (FOSAMAX) 70 MG tablet    Sig: TAKE ONE TABLET BY MOUTH ONCE A WEEK. TAKE WITH A FULL GLASS OF WATER ON AN EMPTY STOMACH.    Dispense:  13 tablet    Refill:  3   Return  precautions advised.  Tana ConchStephen Tiena Manansala, MD

## 2017-10-23 ENCOUNTER — Other Ambulatory Visit: Payer: Self-pay

## 2017-10-23 DIAGNOSIS — G309 Alzheimer's disease, unspecified: Principal | ICD-10-CM

## 2017-10-23 DIAGNOSIS — F0280 Dementia in other diseases classified elsewhere without behavioral disturbance: Secondary | ICD-10-CM

## 2017-10-26 DIAGNOSIS — Z7902 Long term (current) use of antithrombotics/antiplatelets: Secondary | ICD-10-CM | POA: Diagnosis not present

## 2017-10-26 DIAGNOSIS — I2510 Atherosclerotic heart disease of native coronary artery without angina pectoris: Secondary | ICD-10-CM | POA: Diagnosis not present

## 2017-10-26 DIAGNOSIS — E785 Hyperlipidemia, unspecified: Secondary | ICD-10-CM | POA: Diagnosis not present

## 2017-10-26 DIAGNOSIS — G309 Alzheimer's disease, unspecified: Secondary | ICD-10-CM | POA: Diagnosis not present

## 2017-10-26 DIAGNOSIS — I10 Essential (primary) hypertension: Secondary | ICD-10-CM | POA: Diagnosis not present

## 2017-10-26 DIAGNOSIS — M5416 Radiculopathy, lumbar region: Secondary | ICD-10-CM | POA: Diagnosis not present

## 2017-10-26 DIAGNOSIS — Z8673 Personal history of transient ischemic attack (TIA), and cerebral infarction without residual deficits: Secondary | ICD-10-CM | POA: Diagnosis not present

## 2017-10-26 DIAGNOSIS — Z87891 Personal history of nicotine dependence: Secondary | ICD-10-CM | POA: Diagnosis not present

## 2017-10-26 DIAGNOSIS — M810 Age-related osteoporosis without current pathological fracture: Secondary | ICD-10-CM | POA: Diagnosis not present

## 2017-10-27 ENCOUNTER — Telehealth: Payer: Self-pay | Admitting: Family Medicine

## 2017-10-27 NOTE — Telephone Encounter (Signed)
May give orders verbally as requested  Given such big swings in BP- do not think we can adjust BP medicines. If they remain concerned about BPs in future- may schedule visit and we can evaluate in office

## 2017-10-27 NOTE — Telephone Encounter (Signed)
Joyce [name] with [facility] called in ref to evaluation performed. Rosanne AshingJim recommends 2x week for 4 weeks for functional mobility, medical social worker eval for community resources, eval by nursing for education injection, and speech therapy for cognition. Rosanne AshingJim stated patient's BP sitting was 160/80 and standing was 120/60. Patient reported no dizziness. Please advise.

## 2017-10-27 NOTE — Telephone Encounter (Signed)
Please see message and advise 

## 2017-10-28 NOTE — Telephone Encounter (Signed)
Called and spoke with Rosanne AshingJim. I gave verbal order approval for requested orders. I instructed him regarding her blood pressures. He verbalized understanding.

## 2017-10-28 NOTE — Telephone Encounter (Signed)
Called and left a voicemail message asking for a return phone call 

## 2017-10-28 NOTE — Telephone Encounter (Signed)
Research study needs to know dosage of namenda if prescribed. Details of how often and dosage needed. The research study needs this information as soon as possible.  Okay to leave message on cell listed.  Ty,  -LL

## 2017-10-28 NOTE — Telephone Encounter (Signed)
Would like a call back RE orders., OR, if you were calling to approve them, it's okay to leave a voivemail.  Ty,   -LL

## 2017-10-28 NOTE — Telephone Encounter (Signed)
Spoke to pt's daughter Lafonda MossesDiana, needs to know what medication and dosage Dr. Durene CalHunter would like to start for pt's Alzheimer's so she can check with Research people. Told her he would like to start Aricept 5 mg daily if okay with Research study.  Lafonda MossesDiana verbalized understanding and will get back to us once she speaks to Research. Told her okay.

## 2017-10-29 ENCOUNTER — Telehealth: Payer: Self-pay | Admitting: Family Medicine

## 2017-10-29 DIAGNOSIS — Z8673 Personal history of transient ischemic attack (TIA), and cerebral infarction without residual deficits: Secondary | ICD-10-CM | POA: Diagnosis not present

## 2017-10-29 DIAGNOSIS — M5416 Radiculopathy, lumbar region: Secondary | ICD-10-CM | POA: Diagnosis not present

## 2017-10-29 DIAGNOSIS — G309 Alzheimer's disease, unspecified: Secondary | ICD-10-CM | POA: Diagnosis not present

## 2017-10-29 DIAGNOSIS — E785 Hyperlipidemia, unspecified: Secondary | ICD-10-CM | POA: Diagnosis not present

## 2017-10-29 DIAGNOSIS — M810 Age-related osteoporosis without current pathological fracture: Secondary | ICD-10-CM | POA: Diagnosis not present

## 2017-10-29 DIAGNOSIS — Z87891 Personal history of nicotine dependence: Secondary | ICD-10-CM | POA: Diagnosis not present

## 2017-10-29 DIAGNOSIS — I10 Essential (primary) hypertension: Secondary | ICD-10-CM | POA: Diagnosis not present

## 2017-10-29 DIAGNOSIS — I2510 Atherosclerotic heart disease of native coronary artery without angina pectoris: Secondary | ICD-10-CM | POA: Diagnosis not present

## 2017-10-29 DIAGNOSIS — Z7902 Long term (current) use of antithrombotics/antiplatelets: Secondary | ICD-10-CM | POA: Diagnosis not present

## 2017-10-29 NOTE — Telephone Encounter (Signed)
Patient's daughter called in reference to notes below stating that Aricept has been approved for research and can be sent to  [facility] [address] - [place], [place] - [id number] [phone] (Phone) [phone] (Fax)

## 2017-10-29 NOTE — Telephone Encounter (Signed)
Revonda StandardAllison with [facility] calling in reference to requesting verbal orders for Patient for 2xweek for 1 week and 1xweek for 2 weeks to address cognition. Please advise.

## 2017-10-30 DIAGNOSIS — Z7902 Long term (current) use of antithrombotics/antiplatelets: Secondary | ICD-10-CM | POA: Diagnosis not present

## 2017-10-30 DIAGNOSIS — Z87891 Personal history of nicotine dependence: Secondary | ICD-10-CM | POA: Diagnosis not present

## 2017-10-30 DIAGNOSIS — Z8673 Personal history of transient ischemic attack (TIA), and cerebral infarction without residual deficits: Secondary | ICD-10-CM | POA: Diagnosis not present

## 2017-10-30 DIAGNOSIS — M810 Age-related osteoporosis without current pathological fracture: Secondary | ICD-10-CM | POA: Diagnosis not present

## 2017-10-30 DIAGNOSIS — M5416 Radiculopathy, lumbar region: Secondary | ICD-10-CM | POA: Diagnosis not present

## 2017-10-30 DIAGNOSIS — E785 Hyperlipidemia, unspecified: Secondary | ICD-10-CM | POA: Diagnosis not present

## 2017-10-30 DIAGNOSIS — I10 Essential (primary) hypertension: Secondary | ICD-10-CM | POA: Diagnosis not present

## 2017-10-30 DIAGNOSIS — G309 Alzheimer's disease, unspecified: Secondary | ICD-10-CM | POA: Diagnosis not present

## 2017-10-30 DIAGNOSIS — I2510 Atherosclerotic heart disease of native coronary artery without angina pectoris: Secondary | ICD-10-CM | POA: Diagnosis not present

## 2017-10-30 NOTE — Telephone Encounter (Signed)
Called and left a voicemail message asking for a return phone call 

## 2017-10-30 NOTE — Telephone Encounter (Signed)
Patient's daughter calling to check on status of Rx below. Please advise.

## 2017-10-30 NOTE — Telephone Encounter (Signed)
Joyce [name] returning missed phone call. Please advise.

## 2017-11-02 DIAGNOSIS — Z8673 Personal history of transient ischemic attack (TIA), and cerebral infarction without residual deficits: Secondary | ICD-10-CM | POA: Diagnosis not present

## 2017-11-02 DIAGNOSIS — E785 Hyperlipidemia, unspecified: Secondary | ICD-10-CM | POA: Diagnosis not present

## 2017-11-02 DIAGNOSIS — G309 Alzheimer's disease, unspecified: Secondary | ICD-10-CM | POA: Diagnosis not present

## 2017-11-02 DIAGNOSIS — M5416 Radiculopathy, lumbar region: Secondary | ICD-10-CM | POA: Diagnosis not present

## 2017-11-02 DIAGNOSIS — Z87891 Personal history of nicotine dependence: Secondary | ICD-10-CM | POA: Diagnosis not present

## 2017-11-02 DIAGNOSIS — Z7902 Long term (current) use of antithrombotics/antiplatelets: Secondary | ICD-10-CM | POA: Diagnosis not present

## 2017-11-02 DIAGNOSIS — I10 Essential (primary) hypertension: Secondary | ICD-10-CM | POA: Diagnosis not present

## 2017-11-02 DIAGNOSIS — M810 Age-related osteoporosis without current pathological fracture: Secondary | ICD-10-CM | POA: Diagnosis not present

## 2017-11-02 DIAGNOSIS — I2510 Atherosclerotic heart disease of native coronary artery without angina pectoris: Secondary | ICD-10-CM | POA: Diagnosis not present

## 2017-11-03 ENCOUNTER — Other Ambulatory Visit: Payer: Self-pay

## 2017-11-03 DIAGNOSIS — Z87891 Personal history of nicotine dependence: Secondary | ICD-10-CM | POA: Diagnosis not present

## 2017-11-03 DIAGNOSIS — E785 Hyperlipidemia, unspecified: Secondary | ICD-10-CM | POA: Diagnosis not present

## 2017-11-03 DIAGNOSIS — Z7902 Long term (current) use of antithrombotics/antiplatelets: Secondary | ICD-10-CM | POA: Diagnosis not present

## 2017-11-03 DIAGNOSIS — M5416 Radiculopathy, lumbar region: Secondary | ICD-10-CM | POA: Diagnosis not present

## 2017-11-03 DIAGNOSIS — I2510 Atherosclerotic heart disease of native coronary artery without angina pectoris: Secondary | ICD-10-CM | POA: Diagnosis not present

## 2017-11-03 DIAGNOSIS — G309 Alzheimer's disease, unspecified: Secondary | ICD-10-CM | POA: Diagnosis not present

## 2017-11-03 DIAGNOSIS — Z8673 Personal history of transient ischemic attack (TIA), and cerebral infarction without residual deficits: Secondary | ICD-10-CM | POA: Diagnosis not present

## 2017-11-03 DIAGNOSIS — M810 Age-related osteoporosis without current pathological fracture: Secondary | ICD-10-CM | POA: Diagnosis not present

## 2017-11-03 DIAGNOSIS — I10 Essential (primary) hypertension: Secondary | ICD-10-CM | POA: Diagnosis not present

## 2017-11-03 MED ORDER — DONEPEZIL HCL 5 MG PO TABS: 5.0000 mg | ORAL_TABLET | Freq: Every day | ORAL | 1 refills | Status: DC

## 2017-11-03 NOTE — Telephone Encounter (Signed)
Patient's daughter calling to check on status of medication. States it had not been called in yet, and was trying to get an update.  Please call and advise patient when possible. Confirmed listed number ([phone]) is best callback number.

## 2017-11-03 NOTE — Telephone Encounter (Signed)
 5mg  Aricept sent to pharmacy. Daughter is aware and she is letting Neurology know.

## 2017-11-03 NOTE — Telephone Encounter (Signed)
Per Dr. Erasmo LeventhalHunter's OV note:   could have vascular element with prior CVAs as well. from avs "MMSE down to 23/30. Would like to start aricept 5mg  but want to make sure this is ok with study criteria before starting. Please discuss with neurology next visit- let me know and I can send this in for you. "  I am going to confirm this was discussed with Neurology and then I will send to pharmacy.

## 2017-11-03 NOTE — Telephone Encounter (Signed)
Called and provided verbal order as requested 

## 2017-11-04 DIAGNOSIS — I2510 Atherosclerotic heart disease of native coronary artery without angina pectoris: Secondary | ICD-10-CM | POA: Diagnosis not present

## 2017-11-04 DIAGNOSIS — Z7902 Long term (current) use of antithrombotics/antiplatelets: Secondary | ICD-10-CM | POA: Diagnosis not present

## 2017-11-04 DIAGNOSIS — Z87891 Personal history of nicotine dependence: Secondary | ICD-10-CM | POA: Diagnosis not present

## 2017-11-04 DIAGNOSIS — I10 Essential (primary) hypertension: Secondary | ICD-10-CM | POA: Diagnosis not present

## 2017-11-04 DIAGNOSIS — M5416 Radiculopathy, lumbar region: Secondary | ICD-10-CM | POA: Diagnosis not present

## 2017-11-04 DIAGNOSIS — G309 Alzheimer's disease, unspecified: Secondary | ICD-10-CM | POA: Diagnosis not present

## 2017-11-04 DIAGNOSIS — Z8673 Personal history of transient ischemic attack (TIA), and cerebral infarction without residual deficits: Secondary | ICD-10-CM | POA: Diagnosis not present

## 2017-11-04 DIAGNOSIS — M810 Age-related osteoporosis without current pathological fracture: Secondary | ICD-10-CM | POA: Diagnosis not present

## 2017-11-04 DIAGNOSIS — E785 Hyperlipidemia, unspecified: Secondary | ICD-10-CM | POA: Diagnosis not present

## 2017-11-05 ENCOUNTER — Telehealth: Payer: Self-pay | Admitting: Family Medicine

## 2017-11-05 DIAGNOSIS — I2510 Atherosclerotic heart disease of native coronary artery without angina pectoris: Secondary | ICD-10-CM | POA: Diagnosis not present

## 2017-11-05 DIAGNOSIS — E785 Hyperlipidemia, unspecified: Secondary | ICD-10-CM | POA: Diagnosis not present

## 2017-11-05 DIAGNOSIS — M5416 Radiculopathy, lumbar region: Secondary | ICD-10-CM | POA: Diagnosis not present

## 2017-11-05 DIAGNOSIS — Z87891 Personal history of nicotine dependence: Secondary | ICD-10-CM | POA: Diagnosis not present

## 2017-11-05 DIAGNOSIS — I10 Essential (primary) hypertension: Secondary | ICD-10-CM | POA: Diagnosis not present

## 2017-11-05 DIAGNOSIS — G309 Alzheimer's disease, unspecified: Secondary | ICD-10-CM | POA: Diagnosis not present

## 2017-11-05 DIAGNOSIS — M810 Age-related osteoporosis without current pathological fracture: Secondary | ICD-10-CM | POA: Diagnosis not present

## 2017-11-05 DIAGNOSIS — Z7902 Long term (current) use of antithrombotics/antiplatelets: Secondary | ICD-10-CM | POA: Diagnosis not present

## 2017-11-05 DIAGNOSIS — Z8673 Personal history of transient ischemic attack (TIA), and cerebral infarction without residual deficits: Secondary | ICD-10-CM | POA: Diagnosis not present

## 2017-11-05 NOTE — Telephone Encounter (Signed)
Copied from CRM [id number]. Topic: Quick Communication - See Telephone Encounter >> Nov 05, 2017  4:01 PM Terisa Starraylor, Brittany L wrote: CRM for notification. See Telephone encounter for:  [facility] needs orders for OT for 2 times a week for 2 weeks. Call back RITU @ [phone].  11/05/17.

## 2017-11-05 NOTE — Telephone Encounter (Signed)
Jim from [facility] called to have orders for a 4 wheel walker for fall risk reduction placed. Call to give orders.

## 2017-11-05 NOTE — Telephone Encounter (Signed)
Called and provided verbal orders as requested 

## 2017-11-06 NOTE — Telephone Encounter (Signed)
Spoke with [place] SinkRita and gave verbal orders.

## 2017-11-09 DIAGNOSIS — I2510 Atherosclerotic heart disease of native coronary artery without angina pectoris: Secondary | ICD-10-CM | POA: Diagnosis not present

## 2017-11-09 DIAGNOSIS — M5416 Radiculopathy, lumbar region: Secondary | ICD-10-CM | POA: Diagnosis not present

## 2017-11-09 DIAGNOSIS — I10 Essential (primary) hypertension: Secondary | ICD-10-CM | POA: Diagnosis not present

## 2017-11-09 DIAGNOSIS — E785 Hyperlipidemia, unspecified: Secondary | ICD-10-CM | POA: Diagnosis not present

## 2017-11-09 DIAGNOSIS — M810 Age-related osteoporosis without current pathological fracture: Secondary | ICD-10-CM | POA: Diagnosis not present

## 2017-11-09 DIAGNOSIS — Z8673 Personal history of transient ischemic attack (TIA), and cerebral infarction without residual deficits: Secondary | ICD-10-CM | POA: Diagnosis not present

## 2017-11-09 DIAGNOSIS — G309 Alzheimer's disease, unspecified: Secondary | ICD-10-CM | POA: Diagnosis not present

## 2017-11-09 DIAGNOSIS — Z7902 Long term (current) use of antithrombotics/antiplatelets: Secondary | ICD-10-CM | POA: Diagnosis not present

## 2017-11-09 DIAGNOSIS — Z87891 Personal history of nicotine dependence: Secondary | ICD-10-CM | POA: Diagnosis not present

## 2017-11-10 DIAGNOSIS — Z87891 Personal history of nicotine dependence: Secondary | ICD-10-CM | POA: Diagnosis not present

## 2017-11-10 DIAGNOSIS — I10 Essential (primary) hypertension: Secondary | ICD-10-CM | POA: Diagnosis not present

## 2017-11-10 DIAGNOSIS — G459 Transient cerebral ischemic attack, unspecified: Secondary | ICD-10-CM | POA: Diagnosis not present

## 2017-11-10 DIAGNOSIS — G309 Alzheimer's disease, unspecified: Secondary | ICD-10-CM | POA: Diagnosis not present

## 2017-11-10 DIAGNOSIS — I2510 Atherosclerotic heart disease of native coronary artery without angina pectoris: Secondary | ICD-10-CM | POA: Diagnosis not present

## 2017-11-10 DIAGNOSIS — M810 Age-related osteoporosis without current pathological fracture: Secondary | ICD-10-CM | POA: Diagnosis not present

## 2017-11-10 DIAGNOSIS — Z8673 Personal history of transient ischemic attack (TIA), and cerebral infarction without residual deficits: Secondary | ICD-10-CM | POA: Diagnosis not present

## 2017-11-10 DIAGNOSIS — E785 Hyperlipidemia, unspecified: Secondary | ICD-10-CM | POA: Diagnosis not present

## 2017-11-10 DIAGNOSIS — Z7902 Long term (current) use of antithrombotics/antiplatelets: Secondary | ICD-10-CM | POA: Diagnosis not present

## 2017-11-10 DIAGNOSIS — M5416 Radiculopathy, lumbar region: Secondary | ICD-10-CM | POA: Diagnosis not present

## 2017-11-11 DIAGNOSIS — E785 Hyperlipidemia, unspecified: Secondary | ICD-10-CM | POA: Diagnosis not present

## 2017-11-11 DIAGNOSIS — I2510 Atherosclerotic heart disease of native coronary artery without angina pectoris: Secondary | ICD-10-CM | POA: Diagnosis not present

## 2017-11-11 DIAGNOSIS — M810 Age-related osteoporosis without current pathological fracture: Secondary | ICD-10-CM | POA: Diagnosis not present

## 2017-11-11 DIAGNOSIS — M5416 Radiculopathy, lumbar region: Secondary | ICD-10-CM | POA: Diagnosis not present

## 2017-11-11 DIAGNOSIS — I10 Essential (primary) hypertension: Secondary | ICD-10-CM | POA: Diagnosis not present

## 2017-11-11 DIAGNOSIS — Z87891 Personal history of nicotine dependence: Secondary | ICD-10-CM | POA: Diagnosis not present

## 2017-11-11 DIAGNOSIS — G309 Alzheimer's disease, unspecified: Secondary | ICD-10-CM | POA: Diagnosis not present

## 2017-11-11 DIAGNOSIS — Z8673 Personal history of transient ischemic attack (TIA), and cerebral infarction without residual deficits: Secondary | ICD-10-CM | POA: Diagnosis not present

## 2017-11-11 DIAGNOSIS — Z7902 Long term (current) use of antithrombotics/antiplatelets: Secondary | ICD-10-CM | POA: Diagnosis not present

## 2017-11-13 DIAGNOSIS — M5416 Radiculopathy, lumbar region: Secondary | ICD-10-CM | POA: Diagnosis not present

## 2017-11-13 DIAGNOSIS — M810 Age-related osteoporosis without current pathological fracture: Secondary | ICD-10-CM | POA: Diagnosis not present

## 2017-11-13 DIAGNOSIS — I10 Essential (primary) hypertension: Secondary | ICD-10-CM | POA: Diagnosis not present

## 2017-11-13 DIAGNOSIS — E785 Hyperlipidemia, unspecified: Secondary | ICD-10-CM | POA: Diagnosis not present

## 2017-11-13 DIAGNOSIS — Z8673 Personal history of transient ischemic attack (TIA), and cerebral infarction without residual deficits: Secondary | ICD-10-CM | POA: Diagnosis not present

## 2017-11-13 DIAGNOSIS — G309 Alzheimer's disease, unspecified: Secondary | ICD-10-CM | POA: Diagnosis not present

## 2017-11-13 DIAGNOSIS — Z7902 Long term (current) use of antithrombotics/antiplatelets: Secondary | ICD-10-CM | POA: Diagnosis not present

## 2017-11-13 DIAGNOSIS — I2510 Atherosclerotic heart disease of native coronary artery without angina pectoris: Secondary | ICD-10-CM | POA: Diagnosis not present

## 2017-11-13 DIAGNOSIS — Z87891 Personal history of nicotine dependence: Secondary | ICD-10-CM | POA: Diagnosis not present

## 2017-11-16 ENCOUNTER — Ambulatory Visit
Admission: RE | Admit: 2017-11-16 | Discharge: 2017-11-16 | Disposition: A | Discharge status: Home / Self Care | Payer: No Typology Code available for payment source | Source: Ambulatory Visit | Attending: Neurology | Admitting: Neurology

## 2017-11-16 DIAGNOSIS — Z7902 Long term (current) use of antithrombotics/antiplatelets: Secondary | ICD-10-CM | POA: Diagnosis not present

## 2017-11-16 DIAGNOSIS — I2510 Atherosclerotic heart disease of native coronary artery without angina pectoris: Secondary | ICD-10-CM | POA: Diagnosis not present

## 2017-11-16 DIAGNOSIS — Z87891 Personal history of nicotine dependence: Secondary | ICD-10-CM | POA: Diagnosis not present

## 2017-11-16 DIAGNOSIS — G309 Alzheimer's disease, unspecified: Secondary | ICD-10-CM

## 2017-11-16 DIAGNOSIS — M5416 Radiculopathy, lumbar region: Secondary | ICD-10-CM | POA: Diagnosis not present

## 2017-11-16 DIAGNOSIS — E785 Hyperlipidemia, unspecified: Secondary | ICD-10-CM | POA: Diagnosis not present

## 2017-11-16 DIAGNOSIS — I10 Essential (primary) hypertension: Secondary | ICD-10-CM | POA: Diagnosis not present

## 2017-11-16 DIAGNOSIS — M810 Age-related osteoporosis without current pathological fracture: Secondary | ICD-10-CM | POA: Diagnosis not present

## 2017-11-16 DIAGNOSIS — Z8673 Personal history of transient ischemic attack (TIA), and cerebral infarction without residual deficits: Secondary | ICD-10-CM | POA: Diagnosis not present

## 2017-11-17 DIAGNOSIS — I2510 Atherosclerotic heart disease of native coronary artery without angina pectoris: Secondary | ICD-10-CM | POA: Diagnosis not present

## 2017-11-17 DIAGNOSIS — I10 Essential (primary) hypertension: Secondary | ICD-10-CM | POA: Diagnosis not present

## 2017-11-17 DIAGNOSIS — G309 Alzheimer's disease, unspecified: Secondary | ICD-10-CM | POA: Diagnosis not present

## 2017-11-17 DIAGNOSIS — M5416 Radiculopathy, lumbar region: Secondary | ICD-10-CM | POA: Diagnosis not present

## 2017-11-17 DIAGNOSIS — Z8673 Personal history of transient ischemic attack (TIA), and cerebral infarction without residual deficits: Secondary | ICD-10-CM | POA: Diagnosis not present

## 2017-11-17 DIAGNOSIS — E785 Hyperlipidemia, unspecified: Secondary | ICD-10-CM | POA: Diagnosis not present

## 2017-11-17 DIAGNOSIS — M810 Age-related osteoporosis without current pathological fracture: Secondary | ICD-10-CM | POA: Diagnosis not present

## 2017-11-17 DIAGNOSIS — Z87891 Personal history of nicotine dependence: Secondary | ICD-10-CM | POA: Diagnosis not present

## 2017-11-17 DIAGNOSIS — Z7902 Long term (current) use of antithrombotics/antiplatelets: Secondary | ICD-10-CM | POA: Diagnosis not present

## 2017-11-18 ENCOUNTER — Telehealth: Payer: Self-pay | Admitting: Family Medicine

## 2017-11-18 DIAGNOSIS — M5416 Radiculopathy, lumbar region: Secondary | ICD-10-CM | POA: Diagnosis not present

## 2017-11-18 DIAGNOSIS — Z87891 Personal history of nicotine dependence: Secondary | ICD-10-CM | POA: Diagnosis not present

## 2017-11-18 DIAGNOSIS — E785 Hyperlipidemia, unspecified: Secondary | ICD-10-CM | POA: Diagnosis not present

## 2017-11-18 DIAGNOSIS — M810 Age-related osteoporosis without current pathological fracture: Secondary | ICD-10-CM | POA: Diagnosis not present

## 2017-11-18 DIAGNOSIS — Z7902 Long term (current) use of antithrombotics/antiplatelets: Secondary | ICD-10-CM | POA: Diagnosis not present

## 2017-11-18 DIAGNOSIS — Z8673 Personal history of transient ischemic attack (TIA), and cerebral infarction without residual deficits: Secondary | ICD-10-CM | POA: Diagnosis not present

## 2017-11-18 DIAGNOSIS — G309 Alzheimer's disease, unspecified: Secondary | ICD-10-CM | POA: Diagnosis not present

## 2017-11-18 DIAGNOSIS — I2510 Atherosclerotic heart disease of native coronary artery without angina pectoris: Secondary | ICD-10-CM | POA: Diagnosis not present

## 2017-11-18 DIAGNOSIS — I10 Essential (primary) hypertension: Secondary | ICD-10-CM | POA: Diagnosis not present

## 2017-11-18 NOTE — Telephone Encounter (Signed)
Spoke with Revonda StandardAllison and gave verbal orders.

## 2017-11-18 NOTE — Telephone Encounter (Signed)
Copied from CRM [phone]. Topic: General - Other >> Nov 18, 2017 11:06 AM Elliot GaultBell, Tiffany M wrote: Joyce [name] name: Joyce [name]  Relation to pt: Speech Pathologist from [facility]  Call back number: [phone]    Reason for call:   requesting verbal orders for cognition for 2 more visit, please advise

## 2017-11-20 DIAGNOSIS — Z8673 Personal history of transient ischemic attack (TIA), and cerebral infarction without residual deficits: Secondary | ICD-10-CM | POA: Diagnosis not present

## 2017-11-20 DIAGNOSIS — I10 Essential (primary) hypertension: Secondary | ICD-10-CM | POA: Diagnosis not present

## 2017-11-20 DIAGNOSIS — G309 Alzheimer's disease, unspecified: Secondary | ICD-10-CM | POA: Diagnosis not present

## 2017-11-20 DIAGNOSIS — I2510 Atherosclerotic heart disease of native coronary artery without angina pectoris: Secondary | ICD-10-CM | POA: Diagnosis not present

## 2017-11-20 DIAGNOSIS — E785 Hyperlipidemia, unspecified: Secondary | ICD-10-CM | POA: Diagnosis not present

## 2017-11-20 DIAGNOSIS — M5416 Radiculopathy, lumbar region: Secondary | ICD-10-CM | POA: Diagnosis not present

## 2017-11-20 DIAGNOSIS — Z7902 Long term (current) use of antithrombotics/antiplatelets: Secondary | ICD-10-CM | POA: Diagnosis not present

## 2017-11-20 DIAGNOSIS — Z87891 Personal history of nicotine dependence: Secondary | ICD-10-CM | POA: Diagnosis not present

## 2017-11-20 DIAGNOSIS — M810 Age-related osteoporosis without current pathological fracture: Secondary | ICD-10-CM | POA: Diagnosis not present

## 2017-11-23 ENCOUNTER — Telehealth: Payer: Self-pay

## 2017-11-23 NOTE — Telephone Encounter (Signed)
Yes thanks ok with verbal 

## 2017-11-23 NOTE — Telephone Encounter (Signed)
Copied from CRM [id number]. Topic: Quick Communication - See Telephone Encounter >> Nov 23, 2017  4:19 PM Guinevere FerrariMorris, Sharamare E, NT wrote: CRM for notification. See Telephone encounter for: Patty from [facility] is calling verbal orders to teach the daughter how to give the patient injections. She would like a call back.  11/23/17.

## 2017-11-25 DIAGNOSIS — Z87891 Personal history of nicotine dependence: Secondary | ICD-10-CM | POA: Diagnosis not present

## 2017-11-25 DIAGNOSIS — M5416 Radiculopathy, lumbar region: Secondary | ICD-10-CM | POA: Diagnosis not present

## 2017-11-25 DIAGNOSIS — E785 Hyperlipidemia, unspecified: Secondary | ICD-10-CM | POA: Diagnosis not present

## 2017-11-25 DIAGNOSIS — G309 Alzheimer's disease, unspecified: Secondary | ICD-10-CM | POA: Diagnosis not present

## 2017-11-25 DIAGNOSIS — I10 Essential (primary) hypertension: Secondary | ICD-10-CM | POA: Diagnosis not present

## 2017-11-25 DIAGNOSIS — I2510 Atherosclerotic heart disease of native coronary artery without angina pectoris: Secondary | ICD-10-CM | POA: Diagnosis not present

## 2017-11-25 DIAGNOSIS — Z7902 Long term (current) use of antithrombotics/antiplatelets: Secondary | ICD-10-CM | POA: Diagnosis not present

## 2017-11-25 DIAGNOSIS — Z8673 Personal history of transient ischemic attack (TIA), and cerebral infarction without residual deficits: Secondary | ICD-10-CM | POA: Diagnosis not present

## 2017-11-25 DIAGNOSIS — M810 Age-related osteoporosis without current pathological fracture: Secondary | ICD-10-CM | POA: Diagnosis not present

## 2017-11-25 NOTE — Telephone Encounter (Signed)
[facility] calling again today to get verbal orders. Please advise.

## 2017-11-26 ENCOUNTER — Ambulatory Visit: Payer: Medicare Other | Admitting: Neurology

## 2017-11-26 DIAGNOSIS — Z8673 Personal history of transient ischemic attack (TIA), and cerebral infarction without residual deficits: Secondary | ICD-10-CM | POA: Diagnosis not present

## 2017-11-26 DIAGNOSIS — Z87891 Personal history of nicotine dependence: Secondary | ICD-10-CM | POA: Diagnosis not present

## 2017-11-26 DIAGNOSIS — G309 Alzheimer's disease, unspecified: Secondary | ICD-10-CM | POA: Diagnosis not present

## 2017-11-26 DIAGNOSIS — I2510 Atherosclerotic heart disease of native coronary artery without angina pectoris: Secondary | ICD-10-CM | POA: Diagnosis not present

## 2017-11-26 DIAGNOSIS — E785 Hyperlipidemia, unspecified: Secondary | ICD-10-CM | POA: Diagnosis not present

## 2017-11-26 DIAGNOSIS — M5416 Radiculopathy, lumbar region: Secondary | ICD-10-CM | POA: Diagnosis not present

## 2017-11-26 DIAGNOSIS — I10 Essential (primary) hypertension: Secondary | ICD-10-CM | POA: Diagnosis not present

## 2017-11-26 DIAGNOSIS — M810 Age-related osteoporosis without current pathological fracture: Secondary | ICD-10-CM | POA: Diagnosis not present

## 2017-11-26 DIAGNOSIS — Z7902 Long term (current) use of antithrombotics/antiplatelets: Secondary | ICD-10-CM | POA: Diagnosis not present

## 2017-11-26 NOTE — Telephone Encounter (Signed)
Called and spoke with Patty and provided verbal order as requested

## 2017-12-03 DIAGNOSIS — M810 Age-related osteoporosis without current pathological fracture: Secondary | ICD-10-CM | POA: Diagnosis not present

## 2017-12-03 DIAGNOSIS — Z87891 Personal history of nicotine dependence: Secondary | ICD-10-CM | POA: Diagnosis not present

## 2017-12-03 DIAGNOSIS — E785 Hyperlipidemia, unspecified: Secondary | ICD-10-CM | POA: Diagnosis not present

## 2017-12-03 DIAGNOSIS — Z8673 Personal history of transient ischemic attack (TIA), and cerebral infarction without residual deficits: Secondary | ICD-10-CM | POA: Diagnosis not present

## 2017-12-03 DIAGNOSIS — M5416 Radiculopathy, lumbar region: Secondary | ICD-10-CM | POA: Diagnosis not present

## 2017-12-03 DIAGNOSIS — G309 Alzheimer's disease, unspecified: Secondary | ICD-10-CM | POA: Diagnosis not present

## 2017-12-03 DIAGNOSIS — I2510 Atherosclerotic heart disease of native coronary artery without angina pectoris: Secondary | ICD-10-CM | POA: Diagnosis not present

## 2017-12-03 DIAGNOSIS — Z7902 Long term (current) use of antithrombotics/antiplatelets: Secondary | ICD-10-CM | POA: Diagnosis not present

## 2017-12-03 DIAGNOSIS — I10 Essential (primary) hypertension: Secondary | ICD-10-CM | POA: Diagnosis not present

## 2017-12-04 ENCOUNTER — Telehealth: Payer: Self-pay | Admitting: Family Medicine

## 2017-12-04 NOTE — Telephone Encounter (Unsigned)
Copied from CRM [id number]. Topic: Quick Communication - See Telephone Encounter >> Dec 04, 2017  1:48 PM Waymon AmatoBurton, Donna F wrote: CRM for notification. See Telephone encounter for: patty with [facility] is wanting to know if Dr. Durene CalHunter will change that the pt  take her aricept with the rest of her meds so the she will remember instead of taking at bedtime   Best number [phone] patty  12/04/17.

## 2017-12-07 NOTE — Telephone Encounter (Signed)
Called and left a voicemail message asking for return phone call so I can provide verbal order as requested

## 2018-01-06 ENCOUNTER — Other Ambulatory Visit: Payer: Self-pay | Admitting: Family Medicine

## 2018-01-06 DIAGNOSIS — I25118 Atherosclerotic heart disease of native coronary artery with other forms of angina pectoris: Secondary | ICD-10-CM

## 2018-01-06 DIAGNOSIS — E785 Hyperlipidemia, unspecified: Secondary | ICD-10-CM

## 2018-01-06 DIAGNOSIS — I10 Essential (primary) hypertension: Secondary | ICD-10-CM

## 2018-01-13 ENCOUNTER — Other Ambulatory Visit: Payer: Self-pay | Admitting: Neurology

## 2018-01-13 DIAGNOSIS — R413 Other amnesia: Secondary | ICD-10-CM

## 2018-01-19 ENCOUNTER — Other Ambulatory Visit: Payer: Self-pay | Admitting: Neurology

## 2018-01-19 ENCOUNTER — Ambulatory Visit
Admission: RE | Admit: 2018-01-19 | Discharge: 2018-01-19 | Disposition: A | Discharge status: Home / Self Care | Payer: No Typology Code available for payment source | Source: Ambulatory Visit | Attending: Neurology | Admitting: Neurology

## 2018-01-19 ENCOUNTER — Other Ambulatory Visit: Payer: No Typology Code available for payment source

## 2018-01-19 DIAGNOSIS — R413 Other amnesia: Secondary | ICD-10-CM

## 2018-01-26 ENCOUNTER — Other Ambulatory Visit: Payer: Self-pay | Admitting: Neurology

## 2018-01-26 DIAGNOSIS — G309 Alzheimer's disease, unspecified: Secondary | ICD-10-CM

## 2018-01-27 ENCOUNTER — Other Ambulatory Visit: Payer: Self-pay | Admitting: Neurology

## 2018-01-27 DIAGNOSIS — G309 Alzheimer's disease, unspecified: Secondary | ICD-10-CM

## 2018-02-25 ENCOUNTER — Other Ambulatory Visit: Payer: Self-pay | Admitting: Family Medicine

## 2018-02-26 ENCOUNTER — Telehealth: Payer: Self-pay | Admitting: Family Medicine

## 2018-02-26 NOTE — Telephone Encounter (Signed)
See note

## 2018-02-26 NOTE — Telephone Encounter (Signed)
Copied from CRM [phone]. Topic: Quick Communication - See Telephone Encounter >> Feb 26, 2018 10:11 AM Herby AbrahamJohnson, Shiquita C wrote:   CRM for notification. See Telephone encounter for: pt's daughter called in to be advised. She said that pt has been prescribed  donepezil (ARICEPT) 5 MG tablet  and has not been taking it as prescribed. She would like to know if she can just stop pt from taking medication since she's not taking properly any ways.   Please advise [name]- Diana CB: [phone]: [phone]  02/26/18.

## 2018-02-27 NOTE — Telephone Encounter (Signed)
im ok with her stopping

## 2018-03-01 ENCOUNTER — Telehealth: Payer: Self-pay | Admitting: Family Medicine

## 2018-03-01 NOTE — Telephone Encounter (Signed)
See note

## 2018-03-01 NOTE — Telephone Encounter (Signed)
Copied from CRM [id number]. Topic: Quick Communication - See Telephone Encounter >> Mar 01, 2018  9:28 AM Lelon FrohlichGolden, Tashia, RMA wrote: CRM for notification. See Telephone encounter for:   03/01/18.pt daughter would like a call back Lafonda MossesDiana she has a few questions

## 2018-03-01 NOTE — Telephone Encounter (Signed)
Called and spoke with daughter Lafonda MossesDiana who just wanted to confirm that she could stop her mother's Aricept and to make sure the refill request for Lexapro has been done. It was done 02/26/18.

## 2018-03-01 NOTE — Telephone Encounter (Signed)
Called and left a voicemail message letting Joyce [name] know it would be ok for her mom to stop the Aricept. I provided a call back number for questions or concerns

## 2018-03-09 ENCOUNTER — Other Ambulatory Visit: Payer: Self-pay | Admitting: Neurology

## 2018-03-09 DIAGNOSIS — G309 Alzheimer's disease, unspecified: Secondary | ICD-10-CM

## 2018-03-11 ENCOUNTER — Telehealth: Payer: Self-pay | Admitting: Cardiovascular Disease

## 2018-03-11 DIAGNOSIS — E782 Mixed hyperlipidemia: Secondary | ICD-10-CM

## 2018-03-11 NOTE — Telephone Encounter (Signed)
New Message:    Pt's daughter is calling due to a medication trial of PRALUENT 75 MG/ML SOPN which was $181 for 3 mnths and now that she is trying to refill prescription and cost has went up to over $1,000. She would like to know is there something cheaper because this medication is working well for her mom.

## 2018-03-11 NOTE — Telephone Encounter (Signed)
Looks as though pt needs new prior authorization for Praluent. Spoke with pt's daughter since pt will need updated labs before this can be submitted. Pt will come on 3/26 for lipid panel, will resubmit PA at that time.

## 2018-03-15 DIAGNOSIS — R531 Weakness: Secondary | ICD-10-CM | POA: Diagnosis not present

## 2018-03-16 ENCOUNTER — Other Ambulatory Visit: Payer: Medicare Other | Admitting: *Deleted

## 2018-03-16 DIAGNOSIS — E782 Mixed hyperlipidemia: Secondary | ICD-10-CM | POA: Diagnosis not present

## 2018-03-16 LAB — LIPID PANEL
CHOL/HDL RATIO: 1.8 ratio (ref 0.0–4.4)
Cholesterol, Total: 137 mg/dL (ref 100–199)
HDL: 76 mg/dL (ref >39)
LDL Calculated: 50 mg/dL (ref 0–99)
TRIGLYCERIDES: 55 mg/dL (ref 0–149)
VLDL Cholesterol Cal: 11 mg/dL (ref 5–40)

## 2018-03-17 NOTE — Telephone Encounter (Signed)
PA approved however cost is prohibitive. Son-in-law came to clinic to fill out PASS paperwork which has been faxed. Provided 2 Praluent samples as well.

## 2018-03-17 NOTE — Telephone Encounter (Signed)
New prior auth has been submitted for continued Praluent coverage.

## 2018-03-23 ENCOUNTER — Other Ambulatory Visit: Payer: Medicare Other

## 2018-03-24 ENCOUNTER — Other Ambulatory Visit: Payer: Medicare Other

## 2018-03-24 ENCOUNTER — Telehealth: Payer: Self-pay | Admitting: Neurology

## 2018-03-24 NOTE — Telephone Encounter (Signed)
I spoke to YemenDiana who states that she has no problem bringing the patient for her appointments but the patient has become paranoid and is refusing to go out of the house and in fact even take her regular medications. I suggested I tried to speak to the patient and see I can convince her to participate

## 2018-03-24 NOTE — Telephone Encounter (Signed)
Joyce [name](not on DPR) said that she missed a call from Dr Pearlean BrownieSethi and the message asked for her to leave some type of numerical message.  Joyce [name] did not understand what she was supposed to re: leaving a message so she has called asking for a call back about pt choosing to opt of out research.  Joyce [name] said she has already spoken with Joyce [name] in research.

## 2018-03-31 ENCOUNTER — Other Ambulatory Visit: Payer: Self-pay | Admitting: Family Medicine

## 2018-04-02 ENCOUNTER — Telehealth: Payer: Self-pay | Admitting: Pharmacist

## 2018-04-02 NOTE — Telephone Encounter (Signed)
Pt approved through PASS program. LMOM to make pt aware.

## 2018-04-05 NOTE — Telephone Encounter (Signed)
Spoke with DPR and advised has been approved. She will call to set up shipment today. She is aware to call with any issues and for renewal in Nov/Dec of this year for 2020.

## 2018-06-07 ENCOUNTER — Ambulatory Visit: Payer: Medicare Other | Admitting: Cardiovascular Disease

## 2018-06-07 ENCOUNTER — Encounter: Payer: Self-pay | Admitting: Cardiovascular Disease

## 2018-06-07 VITALS — BP 122/58 | HR 61 | Ht 59.0 in | Wt 136.1 lb

## 2018-06-07 DIAGNOSIS — I2510 Atherosclerotic heart disease of native coronary artery without angina pectoris: Secondary | ICD-10-CM

## 2018-06-07 MED ORDER — NITROGLYCERIN 0.4 MG SL SUBL: 0.4000 mg | SUBLINGUAL_TABLET | SUBLINGUAL | 3 refills | Status: DC | PRN

## 2018-06-07 NOTE — Patient Instructions (Signed)

## 2018-06-07 NOTE — Progress Notes (Signed)
Cardiology Office Note Date:  06/09/2018   ID:  Joyce [name], DOB 08/16/1931, MRN [id number]  PCP:  Marin Olp, MD  Cardiologist:  Sherren Mocha, MD    Chief Complaint  Patient presents with  . Coronary Artery Disease     History of Present Illness: Joyce [name] is a 84 y.o. female who presents for follow-up evaluation.  The patient is here with her daughter today.  She has been followed for coronary artery disease, hypertension, hyperlipidemia, and previous stroke.  She has had problems with dementia, but continues to live independently.  She has a good support network.  Caregivers come into the house every morning and stay till about lunchtime.  The patient denies any chest pain, chest pressure, or shortness of breath.  She has had no heart palpitations.  She does complain of leg swelling, but no orthopnea or PND.   Past Medical History:  Diagnosis Date  . Adjustment disorder with anxiety STRESS CHEST DISCOMFORT   PT RECENTLY PUT HUSBAND IN NURSING HOME  . Alzheimer's dementia 10/22/2017  . Borderline glaucoma   . Candidiasis of skin and nails   . Coronary atherosclerosis of unspecified type of vessel, native or graft CARDIOLOGIST- DR [facility]   DENIES S & S  . GERD 04/05/2008  . GERD (gastroesophageal reflux disease)   . History of echocardiogram    Echo 8/18: EF 60-65, no RWMA, MAC  . History of sleep apnea YRS AGO ON CPAP UNTIL LOST WT  . Hyperlipidemia   . Hypothyroidism   . IBS (irritable bowel syndrome)   . Internal hemorrhoids without mention of complication   . Intracranial vascular stenosis 07/02/2015  . Lesion of bladder   . Nocturia   . Numbness and tingling of foot LEFT -- SECONDARY TO PINCHED LUMBAR NERVE  . Overactive bladder 08/14/2014  . Pinched nerve LUMBAR   RESIDUAL LEFT FOOT NUMBNESS/ TINGLING  . Scoliosis   . Sinusitis 09/29/2013  . Stress incontinence   . Unspecified essential hypertension     Past Surgical History:  Procedure  Laterality Date  . CARDIAC CATHETERIZATION  01-10-2003   DR Gwyndolyn Saxon DOWNEY   PATENT CIRCUMFLEX STENT/ MODERATE CAD ELSEWHERE  . CARDIOVASCULAR STRESS TEST  07-27-2012   NORMAL NUCLEAR STUDY/ LVEF 81%  . CATARACT EXTRACTION W/ INTRAOCULAR LENS  IMPLANT, BILATERAL    . CHOLECYSTECTOMY  1976  . CORONARY ANGIOPLASTY WITH STENT PLACEMENT  08-31-2001   DR Darnell Level BRODIE   STENTING OF PROXIMAL CIRCUMFLEX (75% TO 0%)   . CYSTOSCOPY WITH BIOPSY  08/02/2012   Procedure: CYSTOSCOPY WITH BIOPSY;  Surgeon: Molli Hazard, MD;  Location: [facility];  Service: Urology;  Laterality: N/A;  . VAGINAL HYSTERECTOMY  1968    Current Outpatient Medications  Medication Sig Dispense Refill  . alendronate (FOSAMAX) 70 MG tablet TAKE ONE TABLET BY MOUTH ONCE A WEEK. TAKE WITH A FULL GLASS OF WATER ON AN EMPTY STOMACH. 13 tablet 3  . amLODipine (NORVASC) 5 MG tablet TAKE 1 TABLET BY MOUTH ONCE DAILY 90 tablet 1  . Calcium-Magnesium-Vitamin D (CALCIUM 1200+D3 PO) Take 1 tablet by mouth daily.    . clopidogrel (PLAVIX) 75 MG tablet TAKE 1 TABLET BY MOUTH  DAILY 90 tablet 1  . Cranberry 600 MG TABS Take 1 tablet by mouth daily.    Marland Kitchen escitalopram (LEXAPRO) 10 MG tablet TAKE 1 TABLET BY MOUTH DAILY 30 tablet 5  . nitroGLYCERIN (NITROSTAT) 0.4 MG SL tablet Place 1 tablet (0.4  mg total) under the tongue every 5 (five) minutes as needed for chest pain. Reported on 03/24/2016 25 tablet 3  . Omega-3 Fatty Acids (OMEGA-3 FISH OIL) 300 MG CAPS Take 1 capsule by mouth daily.    Marland Kitchen PRALUENT 75 MG/ML SOPN INJECT SUBCUTANEOUSLY 75MG EVERY TWO WEEKS 6 pen 3  . ranitidine (ZANTAC) 150 MG tablet Take 150 mg by mouth daily.    Marland Kitchen SYNTHROID 100 MCG tablet TAKE 1 TABLET BY MOUTH  DAILY 90 tablet 3   No current facility-administered medications for this visit.     Allergies:   Loratadine; Zoloft [sertraline hcl]; Adhesive [tape]; Aspirin; Levaquin [levofloxacin in d5w]; Levofloxacin; Lumigan [bimatoprost]; Mobic  [meloxicam]; Penicillins; and Statins   Social History:  The patient  reports that she quit smoking about 34 years ago. Her smoking use included cigarettes. She has a 30.00 pack-year smoking history. She has never used smokeless tobacco. She reports that she does not drink alcohol or use drugs.   Family History:  The patient's family history includes CAD in her father; Heart disease in her mother.    ROS:  Please see the history of present illness.  Otherwise, review of systems is positive for hearing loss, memory problems.  All other systems are reviewed and negative.    PHYSICAL EXAM: VS:  BP (!) 122/58   Pulse 61   Ht 4' 11"  (1.499 m)   Wt 136 lb 1.9 oz (61.7 kg)   LMP  (LMP Unknown)   SpO2 98%   BMI 27.49 kg/m  , BMI Body mass index is 27.49 kg/m. GEN: Well nourished, well developed, in no acute distress  HEENT: normal  Neck: no JVD, no masses. No carotid bruits Cardiac: RRR without murmur or gallop    Respiratory:  clear to auscultation bilaterally, normal work of breathing GI: soft, nontender, nondistended, + BS MS: no deformity or atrophy  Ext: no pretibial edema, pedal pulses 2+= bilaterally Skin: warm and dry, no rash Neuro:  Strength and sensation are intact Psych: euthymic mood, full affect  EKG:  EKG is not ordered today.  Recent Labs: 07/13/2017: ALT 16; BUN 10; Creatinine, Ser 0.69; Hemoglobin 15.4; Platelets 288.0; Potassium 3.7; Sodium 138; TSH 4.19   Lipid Panel     Component Value Date/Time   CHOL 137 03/16/2018 0812   TRIG 55 03/16/2018 0812   HDL 76 03/16/2018 0812   CHOLHDL 1.8 03/16/2018 0812   CHOLHDL 2.0 11/14/2015 0801   VLDL 13 11/14/2015 0801   LDLCALC 50 03/16/2018 0812   LDLDIRECT 45.0 02/17/2017 1206      Wt Readings from Last 3 Encounters:  06/07/18 136 lb 1.9 oz (61.7 kg)  10/22/17 134 lb (60.8 kg)  10/22/17 134 lb (60.8 kg)     Cardiac Studies Reviewed: Echo 08-14-2017: Study Conclusions  - Left ventricle: The cavity size was  normal. Systolic function was   normal. The estimated ejection fraction was in the range of 60%   to 65%. Wall motion was normal; there were no regional wall   motion abnormalities. Left ventricular diastolic function   parameters were normal. - Mitral valve: Calcified annulus. - Atrial septum: No defect or patent foramen ovale was identified.  ASSESSMENT AND PLAN: 1.  CAD, native vessel, without angina: The patient is stable on her current medical program.  They really want her medicines minimized.  I think we have achieved that with use of clopidogrel for antiplatelet therapy, amlodipine for blood pressure, and Praluent for lipid lowering.  She  is statin intolerant.  Her lipids have been extremely high when not on therapy.  She is been treated with clopidogrel ever since a stroke a few years back.  2.  Hyperlipidemia: Treated with Praluent.  Last lipids reviewed and are at goal.  3.  History of stroke: Medical therapy as above with no recurrence.  4.  Hypertension: Blood pressure well controlled on amlodipine.  Current medicines are reviewed with the patient today.  The patient does not have concerns regarding medicines.  Labs/ tests ordered today include:  No orders of the defined types were placed in this encounter.  Disposition:   FU one year  Signed, Sherren Mocha, MD  06/09/2018 10:28 PM    [address], [place], [place]  [address] Phone: [phone]; Fax: [phone]

## 2018-07-05 ENCOUNTER — Other Ambulatory Visit: Payer: Self-pay | Admitting: Family Medicine

## 2018-07-05 DIAGNOSIS — I10 Essential (primary) hypertension: Secondary | ICD-10-CM

## 2018-07-05 DIAGNOSIS — I25118 Atherosclerotic heart disease of native coronary artery with other forms of angina pectoris: Secondary | ICD-10-CM

## 2018-07-05 DIAGNOSIS — E785 Hyperlipidemia, unspecified: Secondary | ICD-10-CM

## 2018-08-12 ENCOUNTER — Telehealth: Payer: Self-pay

## 2018-08-12 NOTE — Telephone Encounter (Signed)
Palliative Care follow up visit scheduled for 08/18/18.

## 2018-08-18 ENCOUNTER — Other Ambulatory Visit: Payer: Medicare Other | Admitting: Nurse Practitioner

## 2018-08-18 DIAGNOSIS — R269 Unspecified abnormalities of gait and mobility: Secondary | ICD-10-CM | POA: Diagnosis not present

## 2018-08-18 DIAGNOSIS — F0390 Unspecified dementia without behavioral disturbance: Secondary | ICD-10-CM

## 2018-08-18 DIAGNOSIS — F4322 Adjustment disorder with anxiety: Secondary | ICD-10-CM

## 2018-08-18 DIAGNOSIS — Z515 Encounter for palliative care: Secondary | ICD-10-CM | POA: Diagnosis not present

## 2018-08-18 DIAGNOSIS — Z9181 History of falling: Secondary | ICD-10-CM

## 2018-08-18 DIAGNOSIS — F339 Major depressive disorder, recurrent, unspecified: Secondary | ICD-10-CM

## 2018-08-18 NOTE — Progress Notes (Signed)
PALLIATIVE CARE CONSULT VISIT   PATIENT NAME: Joyce [name] DOB: July 07, 1931 MRN: [id number]  PRIMARY CARE PROVIDER:   Marin Olp, MD  REFERRING PROVIDER:  Marin Olp, MD [place], [place] [address]  RESPONSIBLE PARTY:   Maximino Sarin [phone]  ASSESSMENT/RECOMMENDATIONS and PLAN:  Alzheimer's Dementia w/o behavioral disturbance  -h/o CVA -gait disturbance  -depression -adjustment d/o with anxiety -oriented to person and place; obvious STML during conversation -uses walker when out but not in house -has Caregiver's Mon-fri for approx 4hrs -has life alert wrist band -dtr lives one mile away -patient denies depression; lexapro has helped -does endorse occasional anxiety -eating well;weight stable  -Chronic medical problems  -CAD -carotid artery disease -HTN -HLD -hypothryroidism -osteoporosis -GERD -30.00 pack year cigarette smoking history -GERD -PCP Dr. Garret Reddish -Cardiologist: Dr. Sherren Mocha -clopidogrel -PRN SL NTG -amolodipine -Praluent -synthroid -Fosamax/calcium/vitamin D -zantac  -ACP -DNR/MOST on refrigerator     I spent  minutes providing this consultation,  from 15:30 to 16:30. More than 50% of the time in this consultation was spent coordinating communication.   HISTORY OF PRESENT ILLNESS:  Joyce [name] is a 84 y.o. year old female with multiple medical problems including dementia, gait disturbance, depression, anxiety, CAD,HTN, hypothyroidism. Palliative Care was asked to help with symptom management and ongoing patient and family support.  CODE STATUS: DNR  PPS: 60% HOSPICE ELIGIBILITY/DIAGNOSIS: TBD  PAST MEDICAL HISTORY:  Past Medical History:  Diagnosis Date  . Adjustment disorder with anxiety STRESS CHEST DISCOMFORT   PT RECENTLY PUT HUSBAND IN NURSING HOME  . Alzheimer's dementia 10/22/2017  . Borderline glaucoma   . Candidiasis of skin and nails   . Coronary atherosclerosis of  unspecified type of vessel, native or graft CARDIOLOGIST- DR [facility]   DENIES S & S  . GERD 04/05/2008  . GERD (gastroesophageal reflux disease)   . History of echocardiogram    Echo 8/18: EF 60-65, no RWMA, MAC  . History of sleep apnea YRS AGO ON CPAP UNTIL LOST WT  . Hyperlipidemia   . Hypothyroidism   . IBS (irritable bowel syndrome)   . Internal hemorrhoids without mention of complication   . Intracranial vascular stenosis 07/02/2015  . Lesion of bladder   . Nocturia   . Numbness and tingling of foot LEFT -- SECONDARY TO PINCHED LUMBAR NERVE  . Overactive bladder 08/14/2014  . Pinched nerve LUMBAR   RESIDUAL LEFT FOOT NUMBNESS/ TINGLING  . Scoliosis   . Sinusitis 09/29/2013  . Stress incontinence   . Unspecified essential hypertension     SOCIAL HX:  Social History   Tobacco Use  . Smoking status: Former Smoker    Packs/day: 1.50    Years: 20.00    Pack years: 30.00    Types: Cigarettes    Last attempt to quit: 07/31/1983    Years since quitting: 35.0  . Smokeless tobacco: Never Used  Substance Use Topics  . Alcohol use: No    ALLERGIES:  Allergies  Allergen Reactions  . Loratadine Other (See Comments)    Gi upset  . Zoloft [Sertraline Hcl] Diarrhea  . Adhesive [Tape] Other (See Comments)    Bruising and severe irritation  . Aspirin Other (See Comments)    Gi upset  . Levaquin [Levofloxacin In D5w] Itching  . Levofloxacin Nausea And Vomiting  . Lumigan [Bimatoprost] Other (See Comments)    Severe burning of eyes  . Mobic [Meloxicam] Nausea And Vomiting and Other (See  Comments)    hallucinations  . Penicillins Diarrhea and Nausea And Vomiting  . Statins Other (See Comments)    Severe muscle pain     PERTINENT MEDICATIONS:  Outpatient Encounter Medications as of 08/18/2018  Medication Sig  . alendronate (FOSAMAX) 70 MG tablet TAKE ONE TABLET BY MOUTH ONCE A WEEK. TAKE WITH A FULL GLASS OF WATER ON AN EMPTY STOMACH.  Marland Kitchen amLODipine (NORVASC) 5 MG tablet TAKE 1  TABLET BY MOUTH ONCE DAILY  . Calcium-Magnesium-Vitamin D (CALCIUM 1200+D3 PO) Take 1 tablet by mouth daily.  . clopidogrel (PLAVIX) 75 MG tablet TAKE 1 TABLET BY MOUTH  DAILY  . Cranberry 600 MG TABS Take 1 tablet by mouth daily.  Marland Kitchen escitalopram (LEXAPRO) 10 MG tablet TAKE 1 TABLET BY MOUTH DAILY  . nitroGLYCERIN (NITROSTAT) 0.4 MG SL tablet Place 1 tablet (0.4 mg total) under the tongue every 5 (five) minutes as needed for chest pain. Reported on 03/24/2016  . Omega-3 Fatty Acids (OMEGA-3 FISH OIL) 300 MG CAPS Take 1 capsule by mouth daily.  Marland Kitchen PRALUENT 75 MG/ML SOPN INJECT SUBCUTANEOUSLY 75MG EVERY TWO WEEKS  . ranitidine (ZANTAC) 150 MG tablet Take 150 mg by mouth daily.  Marland Kitchen SYNTHROID 100 MCG tablet TAKE 1 TABLET BY MOUTH  DAILY   No facility-administered encounter medications on file as of 08/18/2018.     PHYSICAL EXAM:   General: NAD, WD/WN Cardiovascular: regular rate and rhythm Pulmonary: clear ant fields Abdomen: soft, nontender, + bowel sounds GU: no suprapubic tenderness Extremities: no edema, no joint deformities Skin: no rashes Neurological: nonfocal  Nylani Michetti G Martinique, NP

## 2018-08-19 ENCOUNTER — Encounter: Payer: Self-pay | Admitting: Nurse Practitioner

## 2018-08-27 ENCOUNTER — Telehealth: Payer: Self-pay

## 2018-08-27 NOTE — Telephone Encounter (Signed)
Contacted by patient's daughter to change date of visit from 09/15/18 to 09/14/18.

## 2018-09-01 ENCOUNTER — Other Ambulatory Visit: Payer: Self-pay | Admitting: Family Medicine

## 2018-09-14 ENCOUNTER — Other Ambulatory Visit: Payer: Medicare Other | Admitting: Nurse Practitioner

## 2018-09-14 DIAGNOSIS — F0280 Dementia in other diseases classified elsewhere without behavioral disturbance: Secondary | ICD-10-CM

## 2018-09-14 DIAGNOSIS — G309 Alzheimer's disease, unspecified: Secondary | ICD-10-CM | POA: Diagnosis not present

## 2018-09-14 DIAGNOSIS — R2681 Unsteadiness on feet: Secondary | ICD-10-CM

## 2018-09-14 DIAGNOSIS — F339 Major depressive disorder, recurrent, unspecified: Secondary | ICD-10-CM

## 2018-09-14 DIAGNOSIS — F419 Anxiety disorder, unspecified: Secondary | ICD-10-CM

## 2018-09-14 DIAGNOSIS — Z515 Encounter for palliative care: Secondary | ICD-10-CM

## 2018-09-15 NOTE — Progress Notes (Signed)
PALLIATIVE CARE CONSULT VISIT   PATIENT NAME: Joyce [name] DOB: [birth year] MRN: [id number]  PRIMARY CARE PROVIDER:   Marin Olp, MD  REFERRING PROVIDER:  Marin Olp, [address] [place], [place] [address]  RESPONSIBLE PARTY:   Maximino Sarin [phone]  -Interim history: patient with no acute changes physically or functionally; has no acute complaints today  ASSESSMENT/RECOMMENDATIONS and PLAN:  Alzheimer's Dementia w/o behavioral disturbance  -h/o CVA -gait disturbance  -depression -adjustment d/o with anxiety -oriented to person and place; obvious STML during conversation -uses walker when out but not in house -has Caregiver's Mon-fri for approx 4hrs -has life alert wrist band -dtr lives one mile away -patient denies depression; lexapro has helped -does endorse occasional anxiety -eating well;weight stable  -Chronic medical problems  -CAD -carotid artery disease -HTN -HLD -hypothryroidism -osteoporosis -GERD -30.00 pack year cigarette smoking history -GERD -PCP Dr. Garret Reddish -Cardiologist: Dr. Sherren Mocha -clopidogrel -PRN SL NTG -amolodipine -Praluent -synthroid -Fosamax/calcium/vitamin D -zantac  -ACP -DNR/MOST on refrigerator     I spent 15  minutes providing this consultation,  from 11:00 to 11:15. More than 50% of the time in this consultation was spent coordinating communication.   HISTORY OF PRESENT ILLNESS:  Joyce [name] is a 84 y.o. year old female with multiple medical problems including dementia, gait disturbance, depression, anxiety, CAD,HTN, hypothyroidism. Palliative Care was asked to help with symptom management and ongoing patient and family support.  CODE STATUS: DNR  PPS: 60% HOSPICE ELIGIBILITY/DIAGNOSIS: TBD  PAST MEDICAL HISTORY:  Past Medical History:  Diagnosis Date  . Adjustment disorder with anxiety STRESS CHEST DISCOMFORT   PT RECENTLY PUT HUSBAND IN NURSING HOME  . Alzheimer's  dementia 10/22/2017  . Borderline glaucoma   . Candidiasis of skin and nails   . Coronary atherosclerosis of unspecified type of vessel, native or graft CARDIOLOGIST- DR [facility]   DENIES S & S  . GERD 04/05/2008  . GERD (gastroesophageal reflux disease)   . History of echocardiogram    Echo 8/18: EF 60-65, no RWMA, MAC  . History of sleep apnea YRS AGO ON CPAP UNTIL LOST WT  . Hyperlipidemia   . Hypothyroidism   . IBS (irritable bowel syndrome)   . Internal hemorrhoids without mention of complication   . Intracranial vascular stenosis 07/02/2015  . Lesion of bladder   . Nocturia   . Numbness and tingling of foot LEFT -- SECONDARY TO PINCHED LUMBAR NERVE  . Overactive bladder 08/14/2014  . Pinched nerve LUMBAR   RESIDUAL LEFT FOOT NUMBNESS/ TINGLING  . Scoliosis   . Sinusitis 09/29/2013  . Stress incontinence   . Unspecified essential hypertension     SOCIAL HX:  Social History   Tobacco Use  . Smoking status: Former Smoker    Packs/day: 1.50    Years: 20.00    Pack years: 30.00    Types: Cigarettes    Last attempt to quit: 07/31/1983    Years since quitting: 35.1  . Smokeless tobacco: Never Used  Substance Use Topics  . Alcohol use: No    ALLERGIES:  Allergies  Allergen Reactions  . Loratadine Other (See Comments)    Gi upset  . Zoloft [Sertraline Hcl] Diarrhea  . Adhesive [Tape] Other (See Comments)    Bruising and severe irritation  . Aspirin Other (See Comments)    Gi upset  . Levaquin [Levofloxacin In D5w] Itching  . Levofloxacin Nausea And Vomiting  . Lumigan [Bimatoprost] Other (See Comments)  Severe burning of eyes  . Mobic [Meloxicam] Nausea And Vomiting and Other (See Comments)    hallucinations  . Penicillins Diarrhea and Nausea And Vomiting  . Statins Other (See Comments)    Severe muscle pain     PERTINENT MEDICATIONS:  Outpatient Encounter Medications as of 09/14/2018  Medication Sig  . alendronate (FOSAMAX) 70 MG tablet TAKE ONE TABLET BY  MOUTH ONCE A WEEK. TAKE WITH A FULL GLASS OF WATER ON AN EMPTY STOMACH.  Marland Kitchen amLODipine (NORVASC) 5 MG tablet TAKE 1 TABLET BY MOUTH ONCE DAILY  . Calcium-Magnesium-Vitamin D (CALCIUM 1200+D3 PO) Take 1 tablet by mouth daily.  . clopidogrel (PLAVIX) 75 MG tablet TAKE 1 TABLET BY MOUTH  DAILY  . Cranberry 600 MG TABS Take 1 tablet by mouth daily.  Marland Kitchen escitalopram (LEXAPRO) 10 MG tablet TAKE 1 TABLET BY MOUTH ONCE DAILY  . nitroGLYCERIN (NITROSTAT) 0.4 MG SL tablet Place 1 tablet (0.4 mg total) under the tongue every 5 (five) minutes as needed for chest pain. Reported on 03/24/2016  . Omega-3 Fatty Acids (OMEGA-3 FISH OIL) 300 MG CAPS Take 1 capsule by mouth daily.  Marland Kitchen PRALUENT 75 MG/ML SOPN INJECT SUBCUTANEOUSLY 75MG EVERY TWO WEEKS  . ranitidine (ZANTAC) 150 MG tablet Take 150 mg by mouth daily.  Marland Kitchen SYNTHROID 100 MCG tablet TAKE 1 TABLET BY MOUTH  DAILY   No facility-administered encounter medications on file as of 09/14/2018.     PHYSICAL EXAM:   General: NAD, WD/WN Cardiovascular: regular rate and rhythm Pulmonary: clear ant fields Abdomen: soft, nontender, + bowel sounds GU: no suprapubic tenderness Extremities: no edema, no joint deformities Skin: no rashes Neurological: nonfocal  Stephanie G Martinique, NP

## 2018-09-24 ENCOUNTER — Other Ambulatory Visit: Payer: Self-pay | Admitting: Family Medicine

## 2018-10-06 ENCOUNTER — Ambulatory Visit (INDEPENDENT_AMBULATORY_CARE_PROVIDER_SITE_OTHER): Payer: Medicare Other

## 2018-10-06 ENCOUNTER — Encounter: Payer: Self-pay | Admitting: Family Medicine

## 2018-10-06 DIAGNOSIS — Z23 Encounter for immunization: Secondary | ICD-10-CM | POA: Diagnosis not present

## 2018-11-17 ENCOUNTER — Other Ambulatory Visit: Payer: Self-pay | Admitting: Family Medicine

## 2018-11-17 MED ORDER — FAMOTIDINE 20 MG PO TABS: 20.0000 mg | ORAL_TABLET | Freq: Two times a day (BID) | ORAL | 1 refills | Status: DC

## 2018-11-24 ENCOUNTER — Telehealth: Payer: Self-pay | Admitting: Pharmacist

## 2018-11-24 NOTE — Telephone Encounter (Signed)
Pt daughter called to inquire about PASS program for next year. She would like us to fax form to [phone] and she will fax back once complete. This has been faxed.

## 2018-11-25 NOTE — Telephone Encounter (Signed)
Pt's daughter called back to clinic - she did not receive the fax from yesterday. Faxed again.

## 2018-12-03 ENCOUNTER — Telehealth: Payer: Self-pay | Admitting: Pharmacist

## 2018-12-03 NOTE — Telephone Encounter (Signed)
Attempted to call pt to let her know that her PA for praluent was approved though 12/22/19. Left message for pt to call back

## 2018-12-08 ENCOUNTER — Other Ambulatory Visit: Payer: Self-pay | Admitting: Family Medicine

## 2018-12-23 ENCOUNTER — Telehealth: Payer: Self-pay

## 2018-12-23 NOTE — Telephone Encounter (Signed)
Pt daughter calling stating patient is due to injection but hasn't received shipment yet. Will provide samples. Daughter will pick up Monday around 3:30

## 2018-12-23 NOTE — Telephone Encounter (Signed)
due for praluent shot next haven't recieved any yet

## 2019-01-03 ENCOUNTER — Encounter (HOSPITAL_COMMUNITY): Payer: Self-pay | Admitting: Emergency Medicine

## 2019-01-03 ENCOUNTER — Emergency Department (HOSPITAL_COMMUNITY)
Admission: EM | Admit: 2019-01-03 | Discharge: 2019-01-03 | Disposition: A | Discharge status: Home / Self Care | Payer: Medicare Other | Attending: Emergency Medicine | Admitting: Emergency Medicine

## 2019-01-03 ENCOUNTER — Other Ambulatory Visit: Payer: Self-pay

## 2019-01-03 ENCOUNTER — Ambulatory Visit: Payer: Self-pay | Admitting: *Deleted

## 2019-01-03 ENCOUNTER — Telehealth: Payer: Self-pay | Admitting: Family Medicine

## 2019-01-03 DIAGNOSIS — I10 Essential (primary) hypertension: Secondary | ICD-10-CM | POA: Diagnosis not present

## 2019-01-03 DIAGNOSIS — T50901A Poisoning by unspecified drugs, medicaments and biological substances, accidental (unintentional), initial encounter: Secondary | ICD-10-CM | POA: Insufficient documentation

## 2019-01-03 DIAGNOSIS — G309 Alzheimer's disease, unspecified: Secondary | ICD-10-CM | POA: Diagnosis not present

## 2019-01-03 DIAGNOSIS — R42 Dizziness and giddiness: Secondary | ICD-10-CM | POA: Diagnosis not present

## 2019-01-03 DIAGNOSIS — Z79899 Other long term (current) drug therapy: Secondary | ICD-10-CM | POA: Insufficient documentation

## 2019-01-03 DIAGNOSIS — Z87891 Personal history of nicotine dependence: Secondary | ICD-10-CM | POA: Insufficient documentation

## 2019-01-03 DIAGNOSIS — E039 Hypothyroidism, unspecified: Secondary | ICD-10-CM | POA: Insufficient documentation

## 2019-01-03 LAB — I-STAT TROPONIN, ED: TROPONIN I, POC: 0 ng/mL (ref 0.00–0.08)

## 2019-01-03 LAB — BASIC METABOLIC PANEL
Anion gap: 6 (ref 5–15)
BUN: 10 mg/dL (ref 8–23)
CO2: 30 mmol/L (ref 22–32)
Calcium: 9 mg/dL (ref 8.9–10.3)
Chloride: 101 mmol/L (ref 98–111)
Creatinine, Ser: 0.52 mg/dL (ref 0.44–1.00)
GFR calc Af Amer: >60 mL/min (ref >60)
Glucose, Bld: 97 mg/dL (ref 70–99)
Potassium: 3.8 mmol/L (ref 3.5–5.1)
Sodium: 137 mmol/L (ref 135–145)

## 2019-01-03 LAB — CBC
HCT: 44.2 % (ref 36.0–46.0)
HEMOGLOBIN: 14.4 g/dL (ref 12.0–15.0)
MCH: 29.3 pg (ref 26.0–34.0)
MCHC: 32.6 g/dL (ref 30.0–36.0)
MCV: 90 fL (ref 80.0–100.0)
Platelets: 267 10*3/uL (ref 150–400)
RBC: 4.91 MIL/uL (ref 3.87–5.11)
RDW: 12.6 % (ref 11.5–15.5)
WBC: 5 10*3/uL (ref 4.0–10.5)
nRBC: 0 % (ref 0.0–0.2)

## 2019-01-03 NOTE — Telephone Encounter (Signed)
See note

## 2019-01-03 NOTE — ED Triage Notes (Signed)
Per pt family: family member spoke to pt at 0800 this am and pt was baseline mental status.  At 0830 caregiver arrived and noticed 3 days of medication (amlodipine and synthroid) was missing and pt stated she felt dizzy.

## 2019-01-03 NOTE — Telephone Encounter (Signed)
Pt's daughter called stating that when the pt's caregiver got to the the pt's house, the pt was weak and dizzy; also the pt's pill box which was filled for Mon, Tue, & Wed were gone (synthroid and amlodipine); the pt is having memory issues ;the pt's blood pressure was 115/58(left) and 134/58 (right) on 01/03/2019 at approximately 0850; Joyce [name] nor Joyce [name] are not sure what her normal blood pressures are; Joyce [name] also inquires if the pt should be taking these medications; explained that the issue of greatest urgency is the pt possibly taking too many medications;  recommendations made per nurse triage protocol; per Beth RN at poison control center the pt should be taken to the ED for evaluation because amlodipine is the drug in question; pt's daughter Joyce [name]) and Caregiver Joyce [name]) informed; the verbalized understanding and will have to pt transported to [facility]; also explained to Joyce [name] that once the pt has been evaluated in the ED, please call back to schedule appointment so that the pt medications can be discussed; she once again verbalizes understanding; Beth, RN from poison control, states that she will call the hospital with recommendations; the pt normally sees Dr Tana Conch, [facility]; will route to office for notification of this encounter.     Reason for Disposition . [1] DOUBLE DOSE (an extra dose or lesser amount) of prescription drug AND [2] any symptoms (e.g., dizziness, nausea, pain, sleepiness)  Answer Assessment - Initial Assessment Questions 1. SYMPTOMS: "Do you have any symptoms?"     Yes weakness 2. SEVERITY: If symptoms are present, ask "Are they mild, moderate or severe?"     moderate  Protocols used: MEDICATION QUESTION CALL-A-AH

## 2019-01-03 NOTE — ED Notes (Signed)
Spoke with poison control and they recommend observing pt for 8 hours from the time pt arrived in the ED.

## 2019-01-03 NOTE — Telephone Encounter (Signed)
Spoke with Waynetta Sandy, RN  at poison control center and she states that since there is amlodipine is the drug in question, the pt should be taken to the ED for evaluation; information relayed to pt's daughter and she verbalized understanding.

## 2019-01-03 NOTE — ED Provider Notes (Signed)
[facility] EMERGENCY DEPARTMENT Provider Note   CSN: [id number] Arrival date & time: 01/03/19  0944     History   Chief Complaint Chief Complaint  Patient presents with  . Drug Overdose    Possibly took extra BP meds  . Dizziness    HPI Joyce [name] is a 85 y.o. female.  Joyce [name] is a 85 y.o. female with a history of Alzheimer's, hypertension, hyperlipidemia, hypothyroidism and GERD, who presents to the emergency department accompanied by her daughter with concern that she may have taken extra doses of her amlodipine and Synthroid.  Per patient's daughter she spoke to the patient at 8 AM this morning and was at baseline, and at 8:30 when the caregiver arrived patient was complaining of some dizziness and generalized weakness, which has since resolved.  Caregiver noticed that from the patient's morning pillbox all of the Monday and Tuesday medications were gone, earlier in the weekend there were 3 blood pressure medication tablets found on the kitchen counter which were thrown away, so it is not unclear if patient missed a few doses earlier in the week, and she could have taken 2-3 doses of both amlodipine and Synthroid today.  She denies any headaches, vision changes, chest pain, shortness of breath, she has not had any syncopal episodes, no lower extremity swelling or edema.  On arrival patient is not hypotensive and vitals normal.  Daughter contacted PCP who recommended they present for evaluation.     Past Medical History:  Diagnosis Date  . Adjustment disorder with anxiety STRESS CHEST DISCOMFORT   PT RECENTLY PUT HUSBAND IN NURSING HOME  . Alzheimer's dementia ([place]) 10/22/2017  . Borderline glaucoma   . Candidiasis of skin and nails   . Coronary atherosclerosis of unspecified type of vessel, native or graft CARDIOLOGIST- DR [facility]   DENIES S & S  . GERD 04/05/2008  . GERD (gastroesophageal reflux disease)   . History of echocardiogram    Echo  8/18: EF 60-65, no RWMA, MAC  . History of sleep apnea YRS AGO ON CPAP UNTIL LOST WT  . Hyperlipidemia   . Hypothyroidism   . IBS (irritable bowel syndrome)   . Internal hemorrhoids without mention of complication   . Intracranial vascular stenosis 07/02/2015  . Lesion of bladder   . Nocturia   . Numbness and tingling of foot LEFT -- SECONDARY TO PINCHED LUMBAR NERVE  . Overactive bladder 08/14/2014  . Pinched nerve LUMBAR   RESIDUAL LEFT FOOT NUMBNESS/ TINGLING  . Scoliosis   . Sinusitis 09/29/2013  . Stress incontinence   . Unspecified essential hypertension     Patient Active Problem List   Diagnosis Date Noted  . Alzheimer's dementia ([place]) 10/22/2017  . Age-related osteoporosis without current pathological fracture 10/22/2017  . High bilirubin 02/17/2017  . Hyperglycemia 08/28/2015  . Intracranial vascular stenosis 07/02/2015  . History of stroke   . History of transient ischemic attack (TIA) 05/16/2015  . Overactive bladder 08/14/2014  . Lumbar pain with radiation down left leg 02/21/2011  . CERUMEN IMPACTION, BILATERAL 11/27/2010  . Essential hypertension 01/17/2010  . Hyperlipidemia 01/15/2009  . ADJUSTMENT DISORDER WITH ANXIOUS MOOD 01/15/2009  . Hypothyroidism 04/05/2008  . CAD (coronary artery disease) 04/05/2008  . GERD 04/05/2008  . IBS (irritable bowel syndrome) 04/05/2008  . INTERNAL HEMORRHOIDS 06/29/2002    Past Surgical History:  Procedure Laterality Date  . CARDIAC CATHETERIZATION  01-10-2003   DR Gwyndolyn Saxon DOWNEY   PATENT CIRCUMFLEX STENT/ MODERATE  CAD ELSEWHERE  . CARDIOVASCULAR STRESS TEST  07-27-2012   NORMAL NUCLEAR STUDY/ LVEF 81%  . CATARACT EXTRACTION W/ INTRAOCULAR LENS  IMPLANT, BILATERAL    . CHOLECYSTECTOMY  1976  . CORONARY ANGIOPLASTY WITH STENT PLACEMENT  08-31-2001   DR Darnell Level BRODIE   STENTING OF PROXIMAL CIRCUMFLEX (75% TO 0%)   . CYSTOSCOPY WITH BIOPSY  08/02/2012   Procedure: CYSTOSCOPY WITH BIOPSY;  Surgeon: Molli Hazard,  MD;  Location: [facility];  Service: Urology;  Laterality: N/A;  . VAGINAL HYSTERECTOMY  1968     OB History   No obstetric history on file.      Home Medications    Prior to Admission medications   Medication Sig Start Date End Date Taking? Authorizing Provider  alendronate (FOSAMAX) 70 MG tablet TAKE 1 TABLET BY MOUTH ONCE A WEEK. TAKE WITH A FULL GLASS OF WATER ON AN EMPTY STOMACH 12/09/18   Marin Olp, MD  amLODipine (NORVASC) 5 MG tablet TAKE 1 TABLET BY MOUTH ONCE DAILY 07/05/18   Marin Olp, MD  Calcium-Magnesium-Vitamin D (CALCIUM 1200+D3 PO) Take 1 tablet by mouth daily.    [provider]  clopidogrel (PLAVIX) 75 MG tablet TAKE 1 TABLET BY MOUTH  DAILY 07/05/18   Marin Olp, MD  Cranberry 600 MG TABS Take 1 tablet by mouth daily.    [provider]  escitalopram (LEXAPRO) 10 MG tablet TAKE 1 TABLET BY MOUTH ONCE DAILY 12/09/18   Marin Olp, MD  famotidine (PEPCID) 20 MG tablet Take 1 tablet (20 mg total) by mouth 2 (two) times daily. 11/17/18   Marin Olp, MD  nitroGLYCERIN (NITROSTAT) 0.4 MG SL tablet Place 1 tablet (0.4 mg total) under the tongue every 5 (five) minutes as needed for chest pain. Reported on 03/24/2016 06/07/18   Sherren Mocha, MD  Omega-3 Fatty Acids (OMEGA-3 FISH OIL) 300 MG CAPS Take 1 capsule by mouth daily.    [provider]  PRALUENT 75 MG/ML SOPN INJECT SUBCUTANEOUSLY 75MG EVERY TWO WEEKS 07/27/17   Sherren Mocha, MD  ranitidine (ZANTAC) 150 MG tablet Take 150 mg by mouth daily.    [provider]  SYNTHROID 100 MCG tablet TAKE 1 TABLET BY MOUTH  DAILY 03/31/18   Marin Olp, MD    Family History Family History  Problem Relation Age of Onset  . Heart disease Mother        per patient died of old age at 20  . CAD Father        massive MI age 58    Social History Social History   Tobacco Use  . Smoking status: Former Smoker    Packs/day: 1.50    Years:  20.00    Pack years: 30.00    Types: Cigarettes    Last attempt to quit: 07/31/1983    Years since quitting: 35.4  . Smokeless tobacco: Never Used  Substance Use Topics  . Alcohol use: No  . Drug use: No     Allergies   Loratadine; Zoloft [sertraline hcl]; Adhesive [tape]; Aspirin; Levaquin [levofloxacin in d5w]; Levofloxacin; Lumigan [bimatoprost]; Mobic [meloxicam]; Penicillins; and Statins   Review of Systems Review of Systems  Constitutional: Negative for chills, fatigue and fever.  HENT: Negative.   Eyes: Negative for visual disturbance.  Respiratory: Negative for cough, chest tightness and wheezing.   Cardiovascular: Negative for chest pain, palpitations and leg swelling.  Gastrointestinal: Negative for abdominal pain, nausea and vomiting.  Genitourinary:  Negative for dysuria.  Musculoskeletal: Negative for arthralgias and myalgias.  Skin: Negative for color change and rash.  Neurological: Positive for weakness and light-headedness. Negative for dizziness, syncope, facial asymmetry, speech difficulty, numbness and headaches.     Physical Exam Updated Vital Signs BP (!) 149/44   Pulse (!) 57   Temp 98.1 F (36.7 C) (Oral)   Resp 20   LMP  (LMP Unknown)   SpO2 100%   Physical Exam Vitals signs and nursing note reviewed.  Constitutional:      General: She is not in acute distress.    Appearance: Normal appearance. She is well-developed. She is not ill-appearing or diaphoretic.  HENT:     Head: Normocephalic and atraumatic.     Mouth/Throat:     Mouth: Mucous membranes are moist.     Pharynx: Oropharynx is clear.  Eyes:     General:        Right eye: No discharge.        Left eye: No discharge.     Pupils: Pupils are equal, round, and reactive to light.  Neck:     Musculoskeletal: Neck supple.  Cardiovascular:     Rate and Rhythm: Normal rate and regular rhythm.     Pulses: Normal pulses.     Heart sounds: Normal heart sounds. No murmur. No friction rub.  No gallop.   Pulmonary:     Effort: Pulmonary effort is normal. No respiratory distress.     Breath sounds: Normal breath sounds. No wheezing or rales.     Comments: Respirations equal and unlabored, patient able to speak in full sentences, lungs clear to auscultation bilaterally Abdominal:     General: Abdomen is flat. Bowel sounds are normal. There is no distension.     Palpations: Abdomen is soft. There is no mass.     Tenderness: There is no abdominal tenderness. There is no guarding.     Comments: Abdomen soft, nondistended, nontender to palpation in all quadrants without guarding or peritoneal signs  Musculoskeletal:        General: No deformity.     Right lower leg: No edema.     Left lower leg: No edema.  Skin:    General: Skin is warm and dry.     Capillary Refill: Capillary refill takes less than 2 seconds.  Neurological:     Mental Status: She is alert and oriented to person, place, and time.     Coordination: Coordination normal.     Comments: Speech is clear, able to follow commands CN III-XII intact Normal strength in upper and lower extremities bilaterally including dorsiflexion and plantar flexion, strong and equal grip strength Sensation normal to light and sharp touch Moves extremities without ataxia, coordination intact   Psychiatric:        Mood and Affect: Mood normal.        Behavior: Behavior normal.      ED Treatments / Results  Labs (all labs ordered are listed, but only abnormal results are displayed) Labs Reviewed  BASIC METABOLIC PANEL  CBC  I-STAT TROPONIN, ED    EKG EKG Interpretation  Date/Time:  Monday January 03 2019 10:37:22 EST Ventricular Rate:  59 PR Interval:    QRS Duration: 97 QT Interval:  463 QTC Calculation: 459 R Axis:   161 Text Interpretation:  Sinus rhythm Probable right ventricular hypertrophy Abnormal T, consider ischemia, diffuse leads Confirmed by Dorie Rank [phone]) on 01/03/2019 10:49:54 AM   Radiology No  results found.  Procedures Procedures (including critical care time)  Medications Ordered in ED Medications - No data to display   Initial Impression / Assessment and Plan / ED Course  I have reviewed the triage vital signs and the nursing notes.  Pertinent labs & imaging results that were available during my care of the patient were reviewed by me and considered in my medical decision making (see chart for details).  Patient presents to the emergency department for evaluation of possible accidental overdose.  Family concerned that patient may have taken extra doses of her blood pressure medication and thyroid medication.  Patient could have taken up to 3 times her usual daily dose.  When caregiver arrived at 830 she was complaining of some lightheadedness and weakness, but reports the symptoms have resolved she denies any associated chest pain or shortness of breath, on arrival patient mildly hypertensive and vitals otherwise normal.  Nursing contacted poison control who recommends observation for 8 hours from the time patient took medications which was around 7 AM - 7:30 AM this morning.  Will check basic labs, troponin and EKG and continue to monitor blood pressure, vitals remained stable at this time and patient is asymptomatic.  Provided with something to eat and drink.  Labs overall very reassuring, no leukocytosis, normal hemoglobin, no acute electrolyte derangements, renal function.  EKG shows sinus rhythm with some nonspecific T wave changes, troponin is negative this patient is not having any chest pain or shortness of breath.  Patient observed here in the emergency department for 7 hours, patient is tolerating p.o. and has ambulated in the hallway with steady gait.  Patient's daughter is at the bedside and reports that with her Alzheimer's she is getting increasingly agitated and confused and requesting to take her mother home, at this time patient has not had any episodes of  hypotension, remains well-appearing and asymptomatic and I feel this is reasonable plan.  She will continue to monitor her mom at home, and have her get up with assistance.  She can begin taking her medications again tomorrow regularly as directed, and is to follow-up with her primary care provider for recheck in the next few days.  Daughter expresses understanding and agreement with plan.  Patient discussed with Dr. Tomi Bamberger, who saw patient as well and agrees with plan.  Vitals:   01/03/19 1115 01/03/19 1300 01/03/19 1315 01/03/19 1423  BP: (!) 149/44 (!) 155/62 (!) 171/50 (!) 128/44  Pulse: (!) 57   65  Resp:    16  Temp:      TempSrc:      SpO2: 100%   98%    Final Clinical Impressions(s) / ED Diagnoses   Final diagnoses:  Accidental drug overdose, initial encounter  Mark    ED Discharge Orders    None       Janet Berlin 01/03/19 1640    Dorie Rank, MD 01/05/19 [phone]

## 2019-01-03 NOTE — Telephone Encounter (Signed)
Copied from CRM [id number]. Topic: Quick Communication - See Telephone Encounter >> Jan 03, 2019  2:59 PM Maia Petties wrote: CRM for notification. See Telephone encounter for: 01/03/19. Pt daughter, Sedalia Muta, called to see if the patients caregiver can begin giving all meds in the mornings to avoid possible drug altercations like happened today. Pt is taking amlodipine, plavix, lexapro, calcium, synthroid daily. Fosamax is 1/week. They want to give all meds at the same time each morning if ok with Dr. Durene Cal. Please call to advise Diane. Ok to leave her msg on her cell phone.

## 2019-01-03 NOTE — Progress Notes (Addendum)
Discharge instructions (including medications) discussed with and copy provided to patient/caregiver.  Pt family members aware pt needs to be monitored for dizziness and hypotension.  Pt A/O and normal tensive at d/c.  Pt unable to sign due to lack of working signature pad.

## 2019-01-03 NOTE — Discharge Instructions (Signed)
Please contact your primary care doctor for follow-up appointment.  Your blood pressure has remained stable here today.  You can continue taking your medications tomorrow as directed but discussed with your regular doctor before moving all medications to the morning.  Return to the emergency department for worsening lightheadedness, any episodes of passing out, feeling weak or any other new or concerning symptoms.

## 2019-01-05 ENCOUNTER — Encounter: Payer: Self-pay | Admitting: *Deleted

## 2019-01-05 ENCOUNTER — Other Ambulatory Visit: Payer: Self-pay | Admitting: *Deleted

## 2019-01-05 ENCOUNTER — Other Ambulatory Visit: Payer: Self-pay

## 2019-01-05 NOTE — Telephone Encounter (Signed)
If she wants to try the Synthroid along with the other medications-we will simply just have to follow the TSH more regularly- we will need to have a repeat lab in 6 weeks and we may have to adjust the dose.  In regards to the Fosamax-if we stop this it will increase her fracture risk if she has a fall.  It is supposed to be taken before food so it is hard for me to recommend it to be taken after food is eaten.  They could also potentially speak with her pharmacist about any alternate dosing regimen  Brandy- do we have access to the [facility] pharmacist-to see if they can weigh in on this issue?

## 2019-01-05 NOTE — Telephone Encounter (Signed)
Forwarding to Dr. Durene Cal to advise. Pt recent seen in the ED for possible medication overdose.

## 2019-01-05 NOTE — Telephone Encounter (Signed)
Thanks for the update

## 2019-01-05 NOTE — Telephone Encounter (Signed)
Spoke with daughter [name]. Her concern is that her mother always gets up around 7:00 AM and eats. The care giver is not able to get there until 8:30 AM. She knows that the Synthroid should be taken before eating and she understands that the preferred medication regimen is Synthroid 30 mins prior to other meds and Fosamax 30 min prior to that but with her moms memory they cannot "trust" her to take the meds. They are unsure if she has or has not been taking meds in general and her mother cannot remember. She wanted to know if her mom even needs to take the Synthroid or Fosamax. She feels like her only option at this point is to hold all the meds until the caretaker arrives and then give them all to her then or if its OK for her mom to eat when she wakes up, have the caretaker give Fosamax when she first gets there, Synthroid 30 mins later, and then all others meds 30 min after that. She plans to come to f/u visit schedule on 01/17/2019 with Dr. Durene Cal but needs to know what the best option would be in the meantime. She is aware that Dr. Durene Cal is only the in the office for 1/2 day today and we will get back in touch with her as soon as we are able. She also noted that for the past 2 days the caretaker has just been giving her mom all of her meds when she arrives.   Forwarding to Dr. Durene Cal to advise.

## 2019-01-05 NOTE — Patient Outreach (Signed)
[facility]) Care Management  01/05/2019  Joyce [name] 09-18-1931 [id number]   Telephone Screen  Referral Date: 01/05/2019 Referral Source: MD referral - [name] Referral Reason:Please engage patient for [name].  The primary contact is Daughter - Joyce [name] [phone].  Per [place] @ Providers office [phone], patient was admitted into [name] due to overdose of taking medications incorrectly. Patient had dementia and is taking Fosamax & Synthroid and they should not be taken together.  Insurance: united health care medicare  ED visit on 01/03/2019 for ? Possible taking extra medicine, dizziness   Outreach attempt # 1 to her daughter  Joyce [name] is able to verify HIPAA for Joyce [name] Reviewed and addressed referral to [name] with patient  Joyce [name] clarifies that is has not been determined if Joyce [name] too Fosamax and synthroid. 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 She states she will return the call to the [facility] staff member    Joyce [name] voices the preference and plan to keep Joyce [name] home as long as possible     Social: Joyce [name] lives at her home alone except for the times her daughter, Joyce [name], and hired caregivers x 2 are present. She is retired and widowed. Her husband was at [facility] prior to his passing ground 01/09/2014 related to Dementia also.  Joyce [name] has 2 daughters. Her daughter, Joyce [name] is married, a Runner, broadcasting/film/video and lives locally. She has not seen her  other daughter in many years. Her son lives out of town but is scheduled to visit during the weekend of 01/07/2019 so he and Joyce [name] can discussed care concerns related to Joyce [name] and to assist in setting up cameras in Joyce [name] home as safety measures Joyce [name] is taking to medical appointments by either her daughter, Joyce [name] or one of the caregivers.  The caregivers are available from 0830 to 11 am Monday through Friday. On Tuesdays and Thursdays from 5 pm to 7 pm there is a caregiver present. Joyce [name] is available on Saturdays and Sundays mornings and on Mondays, Wednesdays and Fridays from 4 pm to 8 pm.  Joyce [name] can feed, dress and bath herself. Joyce [name] reports that Joyce [name] memory concerns have been noted to decline and voices concern with trusting Joyce [name] judgement at this time. Joyce [name] has recently noted Joyce [name] not to look bathed when she reports she completed this task.   Joyce [name] reports a noted decline in Joyce [name] memory and interest in doing things since December 2019. She was reported to like to go to church but lately does not have an interest in going to church, shopping or playing solitaire or checking social media on online. She has been noted to sleep more and to have poor focus during conversations. As of last week it was noted she was not eating food fixed and left for her by caregivers. She still like to go get her hair  done as scheduled   Conditions:  Alzheimer's dementia, hypertension, hyperlipidemia, hypothyroidism, CAD, internal hemorrhoids, GERD, IBS, hypothyroidism, bilateral cerumen impaction, overactive bladder, HDL, hyperglycemia, hx of stroke, hx of TIA, lumbar pain with radiation down left leg, adjustment disorder with anxious mood   Falls none per Joyce [name]  Medications: Joyce [name] was previously administering her own medications but since the 01/03/2019 ED visit the daughter and caregivers are administering medications and has the medications in a lock box when they are not  present. Joyce [name] is questioning whether some meds are still relevant for Joyce [name] to take Joyce [name] has consulted with Joyce [name] who she reports states her fosamax and synthroid need to be given on an empty stomach or the pt needs to be brought to the  Office for injectable medicine Joyce [name] denies concerns with affording medications and side effects of medications but does have questions about the present medication list    DME walker (in the garage, not used), life alert on wrist and an ALEXA but is not focus enough to use them   Appointments: She was seen by a neurologist, Joyce [name] and was in a Alzheimer's research study but ended about a yr ago as Joyce [name] began not wanting to participate with [name] She had been on namenda, Aricept and Seroquel- Meds discontinued as she was not taking them as ordered  Joyce [name] has not been seen in a while (since approximately 10/2017) but she has an upcoming appointment on 01/17/2019   Advance Directives: Joyce [name] is her POA and Joyce [name] has a living will    Consent: [name] [place] RN [name] reviewed [name] services with patient. Patient gave verbal consent for services for [name] telephonic RN [name], pharmacy and SW at this time.   Plan: [facility] RN [name] will refer pharmacy for medication management, medication adherence, questions about medication revelance and polypharmacy for this Alzheimer's dementia patient.   [facility]ce [name] will refer to SW (assistance with resources and information about local facilities (assisted living)/level of care, community resources for caregiver of alzheimer patient, questions about competency, guardianship)  Note routed to MDs in care team   L. Noelle Penner, RN, BSN, CCM [name] Telephonic Care Management Care Coordinator Office number [phone] Mobile number [phone]  Main Joyce number [phone] Fax number [phone]

## 2019-01-05 NOTE — Telephone Encounter (Signed)
Synthroid needs to be 30 minutes before other meds unfortunately- fosamax needs to be 30 minutes before the synthroid  otherwise- ok to take all listed meds in the morning

## 2019-01-05 NOTE — Telephone Encounter (Signed)
Spoke with Joyce [name], I made her aware that [facility] would be reaching out to her regarding a pharmacist coming to the house. I also advised of Dr. Pamala Hurry recommendations. She says that for now, for her mom's safety, she is just going to continue having the caregiver give the meds when she gets there in the mornings. She mentioned possibly looking into an assisted living facility. She will await call from [facility] regarding consult with pharmacist and follow-up with Dr. Durene Cal during the scheduled visit 01/17/18 to make sure everyone is on the same page and the best plan of action is being followed. No further action required at this time.   Forwarding to Dr. Durene Cal as [name].

## 2019-01-05 NOTE — Telephone Encounter (Signed)
Dr. Durene Cal expressed concern about a patient being high risk and that if she takes the medication Fosamax and Synthroid at different times. Dr. Durene Cal states that the fosamax is supposed to be taken 30 min before food and the synthroid is taken 30 min before any other medication. Dr. Durene Cal also mentioned that the patient was recently said she was admitted because she has dementia and took too much of the medications and was admitted. Dr. Durene Cal would like a [facility] pharmacist to help with the patients medications.  I reached out to Care Management at [phone] and spoke with Elza Rafter with [facility] to assist. Elza Rafter took down all information and stated that she would place a referral for a [facility] pharmacist today and they would contact [name] at the number provided [phone] to advise about referral.   I explained this to Vanduser and she said she would contact [name] today to notify her of the steps we have taken to assist. I am routing the message to [place] to document on also.

## 2019-01-05 NOTE — Telephone Encounter (Signed)
Pt seen in the ED 01/03/2019. Hosp f/u scheduled for 01/17/2019. Forwarding to Dr. Durene Cal as [name].

## 2019-01-05 NOTE — Telephone Encounter (Signed)
Awesome job team- Thanks!

## 2019-01-10 ENCOUNTER — Ambulatory Visit: Payer: Self-pay | Admitting: *Deleted

## 2019-01-10 ENCOUNTER — Ambulatory Visit: Payer: Self-pay

## 2019-01-10 ENCOUNTER — Other Ambulatory Visit: Payer: Self-pay

## 2019-01-10 NOTE — Patient Outreach (Addendum)
[facility] Care Management  [facility]   01/10/2019  Joyce AlarJennie G [name] 1931/07/21 [id number]  Reason for referral: Medication Management, polypharmacy medication review  [facility] Current insurance:[employer]  PMHx includes but not limited to:   Hypertension, coronary artery disease, GERD, IBS, hypothyroidism, alzheimer's dementia andhyperlipidemia.  Outreach:  Successful telephone call with Joyce [name], Joyce BuffyDianna. Marland Kitchen.  HIPAA identifiers verified.   Subjective:  [name] states that her mother has "gone downhill since Christmas".  She states that her mother had a recent ED visit because she may have taken too many blood pressure pills.  [name] reports that the family installed cameras in her house this weekend, so they monitor her while she is alone. She has aides during the day, but is alone for the majority of the day.  She reports that the family is meeting tonight with the caregivers to see if they can get more caregiver time.   [name] states she has already streamlined Joyce [name]'s medications.  She states the aide gives the medications (that are now locked up) when they arrive each morning.  Joyce [name] will have usually already eaten, although daugther reports that her mom is now forgetting to eat.  Patient has an appointment with Dr. Durene CalHunter on Monday.  Objective: Lab Results  Component Value Date   CREATININE 0.52 01/03/2019   CREATININE 0.69 07/13/2017   CREATININE 0.65 06/26/2017    Lab Results  Component Value Date   HGBA1C 5.7 (H) 05/17/2015    Lipid Panel     Component Value Date/Time   CHOL 137 03/16/2018 0812   TRIG 55 03/16/2018 0812   HDL 76 03/16/2018 0812   CHOLHDL 1.8 03/16/2018 0812   CHOLHDL 2.0 11/14/2015 0801   VLDL 13 11/14/2015 0801   LDLCALC 50 03/16/2018 0812   LDLDIRECT 45.0 02/17/2017 1206    BP Readings from Last 3 Encounters:  01/03/19 (!) 128/44  08/19/18 128/60  06/07/18 (!) 122/58     Allergies  Allergen Reactions  . Loratadine Other (See Comments)    Gi upset  . Zoloft [Sertraline Hcl] Diarrhea  . Adhesive [Tape] Other (See Comments)    Bruising and severe irritation  . Aspirin Other (See Comments)    Gi upset  . Levaquin [Levofloxacin In D5w] Itching  . Levofloxacin Nausea And Vomiting  . Lumigan [Bimatoprost] Other (See Comments)    Severe burning of eyes  . Mobic [Meloxicam] Nausea And Vomiting and Other (See Comments)    hallucinations  . Penicillins Diarrhea and Nausea And Vomiting  . Statins Other (See Comments)    Severe muscle pain    Medications Reviewed Today    Reviewed by Hurley CiscoMendenhall, Adleigh Mcmasters D, [facility]PH (Pharmacist) on 01/10/19 at 1250  Med List Status: <None>  Medication Order Taking? Sig Documenting Provider Last Dose Status Informant  alendronate (FOSAMAX) 70 MG tablet [id number] Yes TAKE 1 TABLET BY MOUTH ONCE A WEEK. TAKE WITH A FULL GLASS OF WATER ON AN EMPTY STOMACH  Patient taking differently:  TAKE 1 TABLET BY MOUTH ONCE A WEEK. TAKE WITH A FULL GLASS OF WATER ON AN EMPTY STOMACH,  TAKES ON Thursday.   Shelva MajesticHunter, Stephen O, MD Taking Active   amLODipine (NORVASC) 5 MG tablet [id number] Yes TAKE 1 TABLET BY MOUTH ONCE DAILY Shelva MajesticHunter, Stephen O, MD Taking Active   Calcium-Magnesium-Vitamin D (CALCIUM 1200+D3 PO) [id number] Yes Take 1 tablet by mouth daily. [provider] Taking Active   clopidogrel (PLAVIX) 75 MG tablet  [id number] Yes TAKE 1 TABLET BY MOUTH  DAILY Shelva Majestic, MD Taking Active   escitalopram (LEXAPRO) 10 MG tablet [id number] Yes TAKE 1 TABLET BY MOUTH ONCE DAILY Shelva Majestic, MD Taking Active   PRALUENT 75 MG/ML SOPN [id number] Yes INJECT SUBCUTANEOUSLY 75MG  EVERY TWO WEEKS  Patient taking differently:  Wednesday   Tonny Bollman, MD Taking Active   SYNTHROID 100 MCG tablet [id number] Yes TAKE 1 TABLET BY MOUTH  DAILY Durene Cal Aldine Contes, MD Taking Active           Assessment:  Drugs sorted by  system:  Neurologic/Psychologic: escitalopram  Cardiovascular: amlodipine, clopidogrel, alirocumab  Endocrine: synthroid  Vitamins/Minerals/Supplements: calcium/magnesium/vitamin D  Miscellaneous: alendronate  Medication Review Findings:  . Synthroid- [name] questioned if her mother still needed this medication.  Counseled her on the reasons why synthroid is very important that this medication cannot be stopped.  . [name] is concerned that synthroid and fosamax need to be given on an empty stomach.  Counseled on why giving these meds on an empty stomach is best and avoiding administration with a calcium supplement is important.  [name] states that the medication administration time is working well for the family and the aides and she does not think she can change the schedule so that these drugs could be given at a different time.  She is aware that synthroid dosing can be adjusted based on thyroid function tests.   Julian Hy- Receiving this medication from a grant.  Dr. Excell Seltzer (cardiology) has told the family this can be discontinued once the grant runs out.    . Is she a candidate for an injectable bisphosphonate instead of oral Fosamax?  [name] is very realistic about her mother's dementia and is aware that she may have to make some tough decisions about SNF placement in the near future.  Medication Assistance Findings:  No medication assistance needs identified  Plan: Route note to PCP, Dr. Durene Cal.   In-basket message to [facility] LCSW to give her an update.   Berlin Hun, PharmD Clinical Pharmacist [facility] [phone]

## 2019-01-14 ENCOUNTER — Other Ambulatory Visit: Payer: Self-pay

## 2019-01-14 NOTE — Patient Outreach (Signed)
[facility]) Care Management  01/14/2019  LORRY WANT 07-29-1931 [id number]  Unsuccessful outreach to the patients daughter regarding recent referral for caregiver resources and long-term care planning. BSW left a HIPAA compliant voice message requesting a return call.  Plan: BSW will attempt contact within the next four business days. BSW as not mailed an unsuccessful outreach letter as BSW in unaware of daughter's address and patient is reported to have dementia.  Bevelyn Ngo, BSW, CDP [facility] [phone]

## 2019-01-14 NOTE — Patient Outreach (Signed)
[facility]) Care Management  01/14/2019  Joyce [name] 1931/12/16 [id number]  BSW received return call from the patients daughter and healthcare power of attorney, [name], patient HIPAA identifiers confirmed. Joyce [name] reports concerns over the patients worsening cognition due to Alzheimer's disease. The patient has recently been seen in the ED due to concerns of taking medications incorrectly. Joyce [name] reports the patient has a morning caregiver who leaves at approximately 11:30 am. The patient has an evening caregiver two nights per week as well.   Both of these caregivers are trusted by the patient and her family due to previously knowing them. The patients husband passed away five years ago and also had a dementia diagnosis. The patients current caregivers assisted a resident who resided in the same memory care as the patients husband, Unfortunately, both of these caregivers assist other patients and can not increase their time with the patient.   Joyce [name] reports concerns surrounding needing to obtain guardianship of the patient. It is reported the patient has advance directives that indicate Joyce [name] to be the healthcare power of attorney. BSW explained that this document is for when a person is no longer able to make decisions for themselves. Considering the patient is diagnosed with Alzheimer's disease guardianship does not seem necessary. BSW did discuss opportunity to pursue this if the patient is still in control of her own finances. Joyce [name] reports all bills are in her name and the patient no longer has access to monetary resources.  Joyce [name] states she and her brother feel the patient will need more support in the next month due to rapid decline. The family has installed cameras and began locking medications. Unfortunately, the patient has recently forgotten to eat or if she has eaten which has been a concern to the family. Joyce [name] states she plans to visit the patient home tonight to  turn the circuit breaker off to the stove.   Joyce [name] is interested in caregiver and placement options but expresses concerns surrounding cost of placement. The patient is over the income limit to receive Medicaid. BSW discussed Medicaid spend down option for long-term care assistance. Joyce [name] is not interested in facilities that accept Medicaid due to concerns with care. Joyce [name] reports the patients late husband had resided in a community that cost approximately $5,000 per month. There is concern the patient may run out of resources and/or decline faster if placed into a community.  BSW discussed available resources including the adult day program offered by [facility] [employer] which is focused on memory care patients, placement options for memory care, respite resources provided by  project [facility] and [facility], as well as the patients transportation benefit under her [facility] plan. Joyce [name] reports she has recently learned of the over the counter benefit offered under the patients payor plan and has recently ordered some items. BSW educated Joyce [name] on this benefit and encouraged her to begin ordering incontinent supplies with leftover monies as these items are very expensive and may be needed in the future. Joyce [name] requests all previous mentioned resources be sent to her via e-mail for review.  Plan: BSW sent resources via secure e-mail to [facility] for review. BSW to follow up with Joyce [name] in the next two weeks to continue providing support with patient needs. Joyce [name] is aware she may outreach this BSW sooner if needed.  Bevelyn Ngo, BSW, CDP [facility] [phone]

## 2019-01-17 ENCOUNTER — Ambulatory Visit (INDEPENDENT_AMBULATORY_CARE_PROVIDER_SITE_OTHER): Payer: Medicare Other | Admitting: Family Medicine

## 2019-01-17 ENCOUNTER — Encounter: Payer: Self-pay | Admitting: Family Medicine

## 2019-01-17 VITALS — BP 146/54 | HR 67 | Temp 98.0°F | Ht 59.25 in | Wt 141.0 lb

## 2019-01-17 DIAGNOSIS — E785 Hyperlipidemia, unspecified: Secondary | ICD-10-CM | POA: Diagnosis not present

## 2019-01-17 DIAGNOSIS — F0280 Dementia in other diseases classified elsewhere without behavioral disturbance: Secondary | ICD-10-CM

## 2019-01-17 DIAGNOSIS — M810 Age-related osteoporosis without current pathological fracture: Secondary | ICD-10-CM

## 2019-01-17 DIAGNOSIS — G309 Alzheimer's disease, unspecified: Secondary | ICD-10-CM

## 2019-01-17 DIAGNOSIS — I10 Essential (primary) hypertension: Secondary | ICD-10-CM

## 2019-01-17 DIAGNOSIS — I2510 Atherosclerotic heart disease of native coronary artery without angina pectoris: Secondary | ICD-10-CM | POA: Diagnosis not present

## 2019-01-17 DIAGNOSIS — E034 Atrophy of thyroid (acquired): Secondary | ICD-10-CM | POA: Diagnosis not present

## 2019-01-17 DIAGNOSIS — Z6828 Body mass index (BMI) 28.0-28.9, adult: Secondary | ICD-10-CM

## 2019-01-17 NOTE — Patient Instructions (Addendum)
Please stop by lab before you go If you do not have mychart- we will call you about results within 5 business days of [place] receiving them.  If you have mychart- we will send your results within 3 business days of [place] receiving them.  If abnormal or we want to clarify a result, we will call or mychart you to make sure you receive the message.  If you have questions or concerns or don't hear within 5-7 days, please send [place] a message or call us.    I'm here for you to help if need be in decision making

## 2019-01-17 NOTE — Assessment & Plan Note (Signed)
S: trouble taking fosamax before other meds A/P: we are going to convert to reclast- need cmet to send in information for yearly infusion

## 2019-01-17 NOTE — Assessment & Plan Note (Addendum)
S: prior strokes may contribute to dementia but also has alzheimers. Patient cannot remember very far back- Cant remember what she had for lunch. Was on memory medication in past- Opted out of study a year ago.  aricept and namenda- wouldn't take- didn't want to take them.   Stove now off when she is there by herself due to risks. Cameras in home  Daughter thinks quality of life is lower than before- sleeps a lot, not going to church regularly which used to be very important to her- despite taking lexapro 10mg    Daughter feels like memory has been worse since after christmas. They have 5 day a week caregiver.   Has had palliative consult-DNR/MOST form ER trip due to medication overdose unintentional due to alzheimers. Now taking all meds at same time in AM Has had [facility] consult with social work as well as pharmacy (daughter found very helpful) A/P:  Worsening it appears slowly with time. Prior stroke likely contributes to memory issues.  - daughter strongly considering putting her mother into facility for her safety- at present. [facility] option [facility] - consolid[facility]  -Wants to minimize visits as much as - asks for prn - consider updating MMSE at follow up for new baseline. Has not been compliant with memory medications. Does not want to follow up with neurology at present -discussed pace but lives alone for now (wonder with 5 day a week caregivers if that could be reconsidered). I did not hear daughter mention option of her moving into same home as daughter - may use adult daycares as well

## 2019-01-17 NOTE — Assessment & Plan Note (Signed)
S: patient unable to tolerate taking synthroid at different time from other meds so is taking with all other meds and not on empty stomach A/P: daughter wanted to stop medication but I warned against due to risks of hypothyroidism- we will check TSH today- I am concerned levels could be off now with 2 weeks of not taking on empty stomach. Advised 6 week check as well but daughter declines- wants to minimize visits to office for mom/Tieasha- who prefers not to venture out unless she has to

## 2019-01-17 NOTE — Assessment & Plan Note (Addendum)
Hypertension/Hyperlipidemia S: compliant with praluent injections as well as plavix. Blood pressure with mild poor control today on amlodipine 5mg  but has looked good at home A/P: daughter asks about stopping praluent- we discussed while this is free continuing medication. Daughter would like to eventually reduce medications but wants to have brother involved with decision- due to mother's worsening alzheimer's and functional status -for htn- continue amlodipine. Home #s 130s over 70s - for hld- continue praluent - continue plavix

## 2019-01-17 NOTE — Progress Notes (Signed)
Phone [phone]   Subjective:  Joyce [name] is a 85 y.o. year old very pleasant female patient who presents for/with See problem oriented charting ROS- memory issues reported . No chest pain or shortness of breath reporte.d no edema reported.   BMI monitoring- elevated BMI noted: Body mass index is 28.24 kg/m. Encouraged need for healthy eating, regular exercise, weight loss.   BMI Metric Follow Up - 01/17/19 2151      BMI Metric Follow Up-Please document annually   Contraindication to BMI Follow Up:  Pt 65 or older for whom wt reduction/gain would complicate other underlying health condition       Past Medical History-  Patient Active Problem List   Diagnosis Date Noted  . Alzheimer's dementia ([place]) 10/22/2017    Priority: High  . History of stroke     Priority: High  . History of transient ischemic attack (TIA) 05/16/2015    Priority: High  . CAD (coronary artery disease) 04/05/2008    Priority: High  . Age-related osteoporosis without current pathological fracture 10/22/2017    Priority: Medium  . High bilirubin 02/17/2017    Priority: Medium  . Hyperglycemia 08/28/2015    Priority: Medium  . Overactive bladder 08/14/2014    Priority: Medium  . Essential hypertension 01/17/2010    Priority: Medium  . Hyperlipidemia 01/15/2009    Priority: Medium  . ADJUSTMENT DISORDER WITH ANXIOUS MOOD 01/15/2009    Priority: Medium  . Hypothyroidism 04/05/2008    Priority: Medium  . IBS (irritable bowel syndrome) 04/05/2008    Priority: Medium  . Lumbar pain with radiation down left leg 02/21/2011    Priority: Low  . CERUMEN IMPACTION, BILATERAL 11/27/2010    Priority: Low  . GERD 04/05/2008    Priority: Low  . INTERNAL HEMORRHOIDS 06/29/2002    Priority: Low  . Intracranial vascular stenosis 07/02/2015    Medications- reviewed and updated Current Outpatient Medications  Medication Sig Dispense Refill  . alendronate (FOSAMAX) 70 MG tablet TAKE 1 TABLET BY MOUTH ONCE  A WEEK. TAKE WITH A FULL GLASS OF WATER ON AN EMPTY STOMACH (Patient taking differently: TAKE 1 TABLET BY MOUTH ONCE A WEEK. TAKE WITH A FULL GLASS OF WATER ON AN EMPTY STOMACH,  TAKES ON Thursday.) 12 tablet 0  . amLODipine (NORVASC) 5 MG tablet TAKE 1 TABLET BY MOUTH ONCE DAILY 90 tablet 1  . Calcium-Magnesium-Vitamin D (CALCIUM 1200+D3 PO) Take 1 tablet by mouth daily.    . clopidogrel (PLAVIX) 75 MG tablet TAKE 1 TABLET BY MOUTH  DAILY 90 tablet 1  . escitalopram (LEXAPRO) 10 MG tablet TAKE 1 TABLET BY MOUTH ONCE DAILY 30 tablet 2  . PRALUENT 75 MG/ML SOPN INJECT SUBCUTANEOUSLY 75MG EVERY TWO WEEKS (Patient taking differently: Wednesday) 6 pen 3  . SYNTHROID 100 MCG tablet TAKE 1 TABLET BY MOUTH  DAILY 90 tablet 3   No current facility-administered medications for this visit.      Objective:  BP (!) 146/54 (BP Location: Left Arm, Patient Position: Sitting, Cuff Size: Large)   Pulse 67   Temp 98 F (36.7 C) (Oral)   Ht 4' 11.25" (1.505 m)   Wt 141 lb (64 kg)   LMP  (LMP Unknown)   SpO2 98%   BMI 28.24 kg/m  Gen: NAD, resting comfortably CV: RRR no murmurs rubs or gallops Lungs: CTAB no crackles, wheeze, rhonchi Abdomen: soft/nontender/nondistended/normal bowel sounds.  Ext: no edema Skin: warm, dry Memory loss noted- doesn't always follow conversation but does  ta other times    Assessment and Plan   CAD (coronary artery disease) Hypertension/Hyperlipidemia S: compliant with praluent injections as well as plavix. Blood pressure with mild poor control today on amlodipine 58m but has looked good at home A/P: daughter asks about stopping praluent- we discussed while this is free continuing medication. Daughter would like to eventually reduce medications but wants to have brother involved with decision- due to mother's worsening alzheimer's and functional status -for htn- continue amlodipine. Home #s 130s over 746s- for hld- continue praluent - continue plavix    Hypothyroidism S: patient unable to tolerate taking synthroid at different time from other meds so is taking with all other meds and not on empty stomach A/P: daughter wanted to stop medication but I warned against due to risks of hypothyroidism- we will check TSH today- I am concerned levels could be off now with 2 weeks of not taking on empty stomach. Advised 6 week check as well but daughter declines- wants to minimize visits to office for mom/Lyrick- who prefers not to venture out unless she has to   Age-related osteoporosis without current pathological fracture S: trouble taking fosamax before other meds A/P: we are going to convert to reclast- need cmet to send in information for yearly infusion    Alzheimer's dementia S: prior strokes may contribute to dementia but also has alzheimers. Patient cannot remember very far back- Cant remember what she had for lunch. Was on memory medication in past- Opted out of study a year ago.  aricept and namenda- wouldn't take- didn't want to take them.   Stove now off when she is there by herself due to risks. Cameras in home  Daughter thinks quality of life is lower than before- sleeps a lot, not going to church regularly which used to be very important to her- despite taking lexapro 197m  Daughter feels like memory has been worse since after christmas. They have 5 day a week caregiver.   Has had palliative consult-DNR/MOST form ER trip due to medication overdose unintentional due to alzheimers. Now taking all meds at same time in AM [facility] (daughter found very helpful) A/P:  Worsening it appears slowly with time. Prior stroke likely contributes to memory issues.  - daughter strongly considering putting her mother into facility for her safety- at present. Best option [facility] - [facility]  -Wants to minimize visits as much as - asks for prn - consider updating MMSE at follow up for new  baseline. Has not been compliant with memory medications. Does not want to follow up with neurology at present -discussed pace but lives alone for now (wonder with 5 day a week caregivers if that could be reconsidered). I did not hear daughter mention option of her moving into same home as daughter - may use adult daycares as well   Future Appointments  Date Time Provider [place]2/03/2019 10:45 AM [place], KeTillie RungHN-COM None   Lab/Order associations: Essential hypertension - Plan: Comp Met (CMET)  Hypothyroidism due to acquired atrophy of thyroid - Plan: TSH  Coronary artery disease involving native coronary artery of native heart without angina pectoris  Hyperlipidemia, unspecified hyperlipidemia type  Age-related osteoporosis without current pathological fracture  Alzheimer's dementia without behavioral disturbance, unspecified timing of dementia onset (HCAlger BMI 28.0-28.9,adult  Return precautions advised.  StGarret ReddishMD

## 2019-01-18 ENCOUNTER — Telehealth: Payer: Self-pay | Admitting: Pharmacist

## 2019-01-18 ENCOUNTER — Other Ambulatory Visit: Payer: Self-pay | Admitting: Family Medicine

## 2019-01-18 DIAGNOSIS — I25118 Atherosclerotic heart disease of native coronary artery with other forms of angina pectoris: Secondary | ICD-10-CM

## 2019-01-18 DIAGNOSIS — E785 Hyperlipidemia, unspecified: Secondary | ICD-10-CM

## 2019-01-18 DIAGNOSIS — I10 Essential (primary) hypertension: Secondary | ICD-10-CM

## 2019-01-18 LAB — TSH: TSH: 3.12 u[IU]/mL (ref 0.35–4.50)

## 2019-01-18 LAB — COMPREHENSIVE METABOLIC PANEL
ALT: 11 U/L (ref 0–35)
AST: 14 U/L (ref 0–37)
Albumin: 4 g/dL (ref 3.5–5.2)
Alkaline Phosphatase: 42 U/L (ref 39–117)
BUN: 17 mg/dL (ref 6–23)
CHLORIDE: 100 meq/L (ref 96–112)
CO2: 30 mEq/L (ref 19–32)
Calcium: 9.5 mg/dL (ref 8.4–10.5)
Creatinine, Ser: 0.61 mg/dL (ref 0.40–1.20)
GFR: 92.99 mL/min (ref >60.00)
Glucose, Bld: 105 mg/dL — ABNORMAL HIGH (ref 70–99)
Potassium: 3.8 mEq/L (ref 3.5–5.1)
Sodium: 138 mEq/L (ref 135–145)
Total Bilirubin: 1.4 mg/dL — ABNORMAL HIGH (ref 0.2–1.2)
Total Protein: 6.7 g/dL (ref 6.0–8.3)

## 2019-01-18 NOTE — Telephone Encounter (Signed)
Thank you. I agree and feel that stopping lipid lowering Rx is appropriate in this setting.

## 2019-01-18 NOTE — Telephone Encounter (Signed)
Patient's daughter called clinic regarding Praluent. She has not heard back yet regarding continued patient assistance for the 2020 year. She would also like pt to stop her Praluent injections due to her advancing Alzheimer's. Pt saw PCP yesterday and there was discussion of scaling back some of patient's medications due to Alzheimer's. Asked patient's daughter if she would like pt to continue on therapy if she can continue to receive Praluent for free, however she would like pt to discontinue therapy either way. Will route to Dr Excell Seltzer as an [name].

## 2019-01-19 ENCOUNTER — Ambulatory Visit: Payer: Medicare Other

## 2019-01-19 ENCOUNTER — Telehealth: Payer: Self-pay

## 2019-01-19 DIAGNOSIS — M810 Age-related osteoporosis without current pathological fracture: Secondary | ICD-10-CM

## 2019-01-19 MED ORDER — ZOLEDRONIC ACID 5 MG/100ML IV SOLN: 5.0000 mg | Freq: Once | INTRAVENOUS | 0 refills | Status: AC

## 2019-01-20 NOTE — Telephone Encounter (Signed)
See note

## 2019-01-20 NOTE — Telephone Encounter (Signed)
Patient's daughter, [name], states that [facility] pharmacy has this medication and inquired if she should pick it up from [facility] for $150 or wait until infusion appointment? Please advise.

## 2019-01-20 NOTE — Telephone Encounter (Signed)
Called and spoke with daughter Sedalia Muta who I confirmed with that the medication would be provided and administered at the infusion center. I let her know I have been working with Geraldine Contras who scheduled infusions to find a time that would work for [employer] schedule. She verbalized understanding and will wait for me to call her back with the schedule

## 2019-01-25 ENCOUNTER — Other Ambulatory Visit: Payer: Self-pay

## 2019-01-25 NOTE — Patient Outreach (Signed)
[facility]) Care Management  01/25/2019  Joyce [name] June 19, 1931 [id number]  Successful outreach to the patients daughter Joyce [name]. Patient HIPAA identifiers confirmed. Joyce [name] reports she has received resources this BSW has provided but has yet to review them. Joyce [name] reports she had a "bad day" with the patient and feels bad about how the day went. BSW encouraged Joyce [name] and reminded her of all the positive ways she has impacted the patients care. The patient continues to have caregivers for a few hours each day. Joyce [name] reports she has began scaling back some unnecessary medications and plans to speak to her brother about other medications to taper. Joyce [name] confirms she feels she has all the resources she needs at this time. BSW will plan to perform a case closure as goals have been met. Joyce [name] confirms she has this BSW's direct contact number and will call this BSW if further assistance and/or resources are needed.  Daneen Schick, BSW, CDP [facility] [phone]

## 2019-02-09 ENCOUNTER — Telehealth: Payer: Self-pay | Admitting: Family Medicine

## 2019-02-09 ENCOUNTER — Ambulatory Visit (HOSPITAL_COMMUNITY)
Admission: RE | Admit: 2019-02-09 | Discharge: 2019-02-09 | Disposition: A | Discharge status: Home / Self Care | Payer: Medicare Other | Source: Ambulatory Visit | Attending: Family Medicine | Admitting: Family Medicine

## 2019-02-09 ENCOUNTER — Encounter (HOSPITAL_COMMUNITY): Payer: Self-pay

## 2019-02-09 DIAGNOSIS — M810 Age-related osteoporosis without current pathological fracture: Secondary | ICD-10-CM | POA: Diagnosis not present

## 2019-02-09 MED ORDER — SODIUM CHLORIDE 0.9 % IV SOLN
Freq: Once | INTRAVENOUS | Status: AC
  Administered 2019-02-09: 10:00:00 via INTRAVENOUS

## 2019-02-09 MED ORDER — ZOLEDRONIC ACID 5 MG/100ML IV SOLN
5.0000 mg | Freq: Once | INTRAVENOUS | Status: AC
  Administered 2019-02-09: 5 mg via INTRAVENOUS
  Filled 2019-02-09: qty 100

## 2019-02-09 NOTE — Telephone Encounter (Signed)
Spoke to pt daughter Sedalia Muta and she informed me that her mother was being prepped for surgery. She stated that in the Mychart message she was told to take her mom to the [facility] for [facility]ast. Diane then stated that when the caregiver took her to the Cancer Center they stated that she has no appt for today. Care giver was then advised to try another side of the hospital for help. Per Diane caregiver stated that someone took the pt and told the caregiver that the pt was in the right part of the hospital. Caregiver was given pt things and pt was put into a gown and they began prepping pt for surgery. I asked Diane did caregiver remember where pt was admitted? She stated "no". I then asked if caregiver took pictures or anything? She stated " I don't know, she prob didn't think to do that." I informed her that there is a protocol that they have to follow before any surgery would have to be done as well as identification protocol. Diane stated " well they had my mom prepped for surgery and im wondering,...WHAT KIND OF SURGERY WERE THEY GOING TO DO ON HER ANYWAY?!!!" . Diane then stated again "well the message is on mychart and it sent [place] to the wrong place it says Cancer Center". I advise pt I would look into it. After reviewing appt. Notes and Mychart message that was sent, I saw nothing about the Cancer Center. Knowing im not completely Epic Savvy, I asked Diannia Ruder for help with reviewing notes and Mychart message. She confirmed my findings. I called Diane back with an update and advised her to go to [facility] to see what she could find out. Diane verbalized understanding.

## 2019-02-09 NOTE — Discharge Instructions (Signed)
Yearly infusion.  When it is time for your next infusion, your doctor will draw new lab work and schedule your next Reclast infusion.   Zoledronic Acid injection (Paget's Disease, Osteoporosis) What is this medicine? ZOLEDRONIC ACID (ZOE le dron ik AS id) lowers the amount of calcium loss from bone. It is used to treat Paget's disease and osteoporosis in women. This medicine may be used for other purposes; ask your health care provider or pharmacist if you have questions. COMMON BRAND NAME(S): Reclast, Zometa What should I tell my health care provider before I take this medicine? They need to know if you have any of these conditions: -aspirin-sensitive asthma -cancer, especially if you are receiving medicines used to treat cancer -dental disease or wear dentures -infection -kidney disease -low levels of calcium in the blood -past surgery on the parathyroid gland or intestines -receiving corticosteroids like dexamethasone or prednisone -an unusual or allergic reaction to zoledronic acid, other medicines, foods, dyes, or preservatives -pregnant or trying to get pregnant -breast-feeding How should I use this medicine? This medicine is for infusion into a vein. It is given by a health care professional in a hospital or clinic setting. Talk to your pediatrician regarding the use of this medicine in children. This medicine is not approved for use in children. Overdosage: If you think you have taken too much of this medicine contact a poison control center or emergency room at once. NOTE: This medicine is only for you. Do not share this medicine with others. What if I miss a dose? It is important not to miss your dose. Call your doctor or health care professional if you are unable to keep an appointment. What may interact with this medicine? -certain antibiotics given by injection -NSAIDs, medicines for pain and inflammation, like ibuprofen or naproxen -some diuretics like bumetanide,  furosemide -teriparatide This list may not describe all possible interactions. Give your health care provider a list of all the medicines, herbs, non-prescription drugs, or dietary supplements you use. Also tell them if you smoke, drink alcohol, or use illegal drugs. Some items may interact with your medicine. What should I watch for while using this medicine? Visit your doctor or health care professional for regular checkups. It may be some time before you see the benefit from this medicine. Do not stop taking your medicine unless your doctor tells you to. Your doctor may order blood tests or other tests to see how you are doing. Women should inform their doctor if they wish to become pregnant or think they might be pregnant. There is a potential for serious side effects to an unborn child. Talk to your health care professional or pharmacist for more information. You should make sure that you get enough calcium and vitamin D while you are taking this medicine. Discuss the foods you eat and the vitamins you take with your health care professional. Some people who take this medicine have severe bone, joint, and/or muscle pain. This medicine may also increase your risk for jaw problems or a broken thigh bone. Tell your doctor right away if you have severe pain in your jaw, bones, joints, or muscles. Tell your doctor if you have any pain that does not go away or that gets worse. Tell your dentist and dental surgeon that you are taking this medicine. You should not have major dental surgery while on this medicine. See your dentist to have a dental exam and fix any dental problems before starting this medicine. Take good care of  your teeth while on this medicine. Make sure you see your dentist for regular follow-up appointments. What side effects may I notice from receiving this medicine? Side effects that you should report to your doctor or health care professional as soon as possible: -allergic reactions  like skin rash, itching or hives, swelling of the face, lips, or tongue -anxiety, confusion, or depression -breathing problems -changes in vision -eye pain -feeling faint or lightheaded, falls -jaw pain, especially after dental work -mouth sores -muscle cramps, stiffness, or weakness -redness, blistering, peeling or loosening of the skin, including inside the mouth -trouble passing urine or change in the amount of urine Side effects that usually do not require medical attention (report to your doctor or health care professional if they continue or are bothersome): -bone, joint, or muscle pain -constipation -diarrhea -fever -hair loss -irritation at site where injected -loss of appetite -nausea, vomiting -stomach upset -trouble sleeping -trouble swallowing -weak or tired This list may not describe all possible side effects. Call your doctor for medical advice about side effects. You may report side effects to FDA at 1-800-FDA-1088. Where should I keep my medicine? This drug is given in a hospital or clinic and will not be stored at home. NOTE: This sheet is a summary. It may not cover all possible information. If you have questions about this medicine, talk to your doctor, pharmacist, or health care provider.  2019 Elsevier/Gold Standard (2014-05-06 14:19:57)

## 2019-02-09 NOTE — Telephone Encounter (Signed)
See note  Copied from CRM [id number]. Topic: General - Other >> Feb 09, 2019 10:27 AM Leafy Ro wrote: Reason for [facility]:WEXHB daughter is calling she is upset . Pt went to cancer center and per daughter they was prepping her for surgery. Pt was there for reclast infusion

## 2019-02-09 NOTE — Progress Notes (Addendum)
First Reclast infusion completed.  Pt stayed an additional 25 minutes past infusion.  Pt tolerated med well, no reaction noted. Reclast info.sheet given to pt.  Pt was d/c via wheelchair to lobby with caregiver.

## 2019-03-12 ENCOUNTER — Other Ambulatory Visit: Payer: Self-pay | Admitting: Family Medicine

## 2019-04-06 ENCOUNTER — Other Ambulatory Visit: Payer: Self-pay | Admitting: Family Medicine

## 2019-05-11 ENCOUNTER — Other Ambulatory Visit: Payer: Self-pay | Admitting: Family Medicine

## 2019-06-08 ENCOUNTER — Other Ambulatory Visit: Payer: Self-pay | Admitting: Family Medicine

## 2019-06-09 NOTE — Telephone Encounter (Signed)
Last OV 01/17/19 Last refill 05/12/19 #30/0 Next OV not scheduled

## 2019-06-16 ENCOUNTER — Telehealth: Payer: Self-pay | Admitting: Internal Medicine

## 2019-06-16 NOTE — Telephone Encounter (Signed)
10:30 am: TC to daughter Cindee Lame [phone]), who had contacted [facility] requesting a f/u Palliative Care home visit (she deferred telehealth).  Scheduled for tomorrow June 26th at Juanita Craver NP-C [phone]

## 2019-06-17 ENCOUNTER — Other Ambulatory Visit: Payer: Self-pay

## 2019-06-17 ENCOUNTER — Other Ambulatory Visit: Payer: Medicare Other | Admitting: Internal Medicine

## 2019-06-17 DIAGNOSIS — Z515 Encounter for palliative care: Secondary | ICD-10-CM | POA: Diagnosis not present

## 2019-06-19 ENCOUNTER — Encounter: Payer: Self-pay | Admitting: Internal Medicine

## 2019-06-19 NOTE — Progress Notes (Addendum)
June 26th, 2020 [facility] Palliative Care Consult Note Telephone: [phone]  Fax: [phone]  PATIENT NAME: Joyce [name] DOB: [birth year] MRN: [id number]   PRIMARY CARE PROVIDER:   Marin Olp, MD   REFERRING PROVIDER:  Marin Olp, MD LOV 01/17/2019) [place], [place] [address]   RESPONSIBLE PARTY:   (dtr) Joyce [name] (DTR) [phone]. (son) Joyce [name] [phone]. D-I-L Joyce [name] [phone].  IMPRESSION / RECOMMENDATIONS:   1.Cognitive / Functional decline: FAST stage 4. Daughter reports very poor short-term memory which has been progressive over these last 6 months; unable to remember what she had for a meal (or even if she ate), or who recent visitors are. She is pleasantly conversant but doesn't initiate conversation. She recognizes her family members. Daughter notes increased daytime somnolence/napping more over these last 3-6 months. Is awake for meals and activities but otherwise dozes off to sleep. She no longer is interested in doing jig-saw puzzles that she used to enjoy. She can select her clothes and dress herself but needing increased cueing over these last months. She is independent in hygiene and continent B/B. Set up for bathing. She is independent in transfers. Doesn't need assistive devices in the home but daughter encourages her to use the rollator walker (for stability and incased she gets fatigues) when they take their walks outside. She is SOB with minimal activity (folding laundry) or after ambulating about 20-30 ft. Showering is very wearying. Patient can prepare her own simple meals. She can't manage the microwave; daughter has disconnected the stove. Patient consumes 100% of 3 daily meals. Her weight is about 140 lbs. At height of 4'11" her BMI is 28.3 kg/m2. Her skin is intact.   -We reviewed LE exercises to improve endurance and strength. Advised to assist patient to do 10 reps of each up to tid.  2.  Family Supports:  Patient's husband died in 19. Daughter Joyce [name] is PCG. Another daughter Joyce [name] is not involved in patient's care. She also has a son Joyce [name] and D-I-L Joyce [name] who visit and assist regularly. Hired care giver Joyce [name] works M-F 8:30am to 1:30 pm, and Sat am for a few hours to assist with breakfast and medications. Tue and Thurs another hired care provider assists 5pm to 7pm. Daughter Joyce [name] is there M,W,F, Sat and Sun. There is always coverage to assist patient with her meals. Joyce [name] notes there is room in the budget to augment care if / when needed. Patient is alone at night but daughter monitors her on a video camera.   3. Advanced Care Directive: DNR and MOST in the home; MOST details: limited medical interventions. Antibiotics and IVFs determine at time of need. No feeding tube.   4. Goals of Care:  To maintain patient in the home.  Adequate control BP  -advised Joyce [name] to obtain a home BP device. Check BP qd-bid at various times a day for 2 weeks; provide measurement to PCP for medication adjustments as needed.   5. Follow up Palliative Care Visit: 12/02/2019 at 11am    I spent 60 minutes providing this consultation,  from 11:00am to 12pm. More than 50% of the time in this consultation was spent coordinating communication.    HISTORY OF PRESENT ILLNESS:  Joyce [name] is a 85 y.o. female with h/o dementia, gait disturbance, depression, anxiety, CAD, HTN, and hypothyroidism. Palliative Care was asked to help with symptom management and ongoing patient and family support.   CODE STATUS: DNR.  MOST form present in home. I uploaded into [facility] EMR ([facility]).   PPS: 60% HOSPICE ELIGIBILITY/DIAGNOSIS: TBD  PAST MEDICAL HISTORY:  Past Medical History:  Diagnosis Date  . Adjustment disorder with anxiety STRESS CHEST DISCOMFORT   PT RECENTLY PUT HUSBAND IN NURSING HOME  . Alzheimer's dementia ([place]) 10/22/2017  . Borderline glaucoma   . Candidiasis of skin and nails   . Coronary  atherosclerosis of unspecified type of vessel, native or graft CARDIOLOGIST- [facility]   DENIES S & S  . GERD 04/05/2008  . GERD (gastroesophageal reflux disease)   . History of echocardiogram    Echo 8/18: EF 60-65, no RWMA, MAC  . History of sleep apnea YRS AGO ON CPAP UNTIL LOST WT  . Hyperlipidemia   . Hypothyroidism   . IBS (irritable bowel syndrome)   . Internal hemorrhoids without mention of complication   . Intracranial vascular stenosis 07/02/2015  . Lesion of bladder   . Nocturia   . Numbness and tingling of foot LEFT -- SECONDARY TO PINCHED LUMBAR NERVE  . Overactive bladder 08/14/2014  . Pinched nerve LUMBAR   RESIDUAL LEFT FOOT NUMBNESS/ TINGLING  . Scoliosis   . Sinusitis 09/29/2013  . Stress incontinence   . Unspecified essential hypertension     SOCIAL HX:  Social History   Tobacco Use  . Smoking status: Former Smoker    Packs/day: 1.50    Years: 20.00    Pack years: 30.00    Types: Cigarettes    Quit date: 07/31/1983    Years since quitting: 35.9  . Smokeless tobacco: Never Used  Substance Use Topics  . Alcohol use: No    ALLERGIES:  Allergies  Allergen Reactions  . Loratadine Other (See Comments)    Gi upset  . Zoloft [Sertraline Hcl] Diarrhea  . Adhesive [Tape] Other (See Comments)    Bruising and severe irritation  . Aspirin Other (See Comments)    Gi upset  . Levaquin [Levofloxacin In D5w] Itching  . Levofloxacin Nausea And Vomiting  . Lumigan [Bimatoprost] Other (See Comments)    Severe burning of eyes  . Mobic [Meloxicam] Nausea And Vomiting and Other (See Comments)    hallucinations  . Penicillins Diarrhea and Nausea And Vomiting  . Statins Other (See Comments)    Severe muscle pain     PERTINENT MEDICATIONS:  Outpatient Encounter Medications as of 06/17/2019  Medication Sig  . amLODipine (NORVASC) 5 MG tablet TAKE 1 TABLET BY MOUTH ONCE DAILY  . clopidogrel (PLAVIX) 75 MG tablet TAKE 1 TABLET BY MOUTH  DAILY  . escitalopram (LEXAPRO)  10 MG tablet Take 1 tablet by mouth once daily  . SYNTHROID 100 MCG tablet TAKE 1 TABLET BY MOUTH  DAILY  . [DISCONTINUED] alendronate (FOSAMAX) 70 MG tablet TAKE 1 TABLET BY MOUTH ONCE A WEEK. TAKE WITH A FULL GLASS OF WATER ON AN EMPTY STOMACH (Patient taking differently: TAKE 1 TABLET BY MOUTH ONCE A WEEK. TAKE WITH A FULL GLASS OF WATER ON AN EMPTY STOMACH,  TAKES ON Thursday.)  . [DISCONTINUED] Calcium-Magnesium-Vitamin D (CALCIUM 1200+D3 PO) Take 1 tablet by mouth daily.  . [DISCONTINUED] famotidine (PEPCID) 20 MG tablet TAKE 1 TABLET BY MOUTH TWO  TIMES DAILY   No facility-administered encounter medications on file as of 06/17/2019.     PHYSICAL EXAM:  Bp:140/64,HR 64, RR 12 General: Well-nourished elderly female sitting on her couch. She maintains good eye-contact and is pleasantly conversant but stares ahead when not directly engaged.  Cardiovascular:  regular rate and rhythm Pulmonary: clear ant fields Abdomen: soft, nontender, + bowel sounds GU: no suprapubic tenderness Extremities: Bilateral LE edema, slightly pitting, no joint deformities Skin: no rashes Neurological: nonfocal   Violeta Gelinas NP-C

## 2019-06-23 ENCOUNTER — Other Ambulatory Visit: Payer: Self-pay | Admitting: Family Medicine

## 2019-06-23 DIAGNOSIS — E785 Hyperlipidemia, unspecified: Secondary | ICD-10-CM

## 2019-06-23 DIAGNOSIS — I10 Essential (primary) hypertension: Secondary | ICD-10-CM

## 2019-06-23 DIAGNOSIS — I25118 Atherosclerotic heart disease of native coronary artery with other forms of angina pectoris: Secondary | ICD-10-CM

## 2019-07-01 ENCOUNTER — Telehealth: Payer: Self-pay | Admitting: Family Medicine

## 2019-07-01 NOTE — Telephone Encounter (Signed)
See note  Copied from [place] #[id number]. Topic: General - Other >> Jul 01, 2019 12:19 PM Celene Kras A wrote: Reason for CRM: Pts daughter called stating she is needing FMLA paperwork filled out. She is requesting to know if she needs to bring her mother in or if she can just drop of paperwork. From 8/17- 9/30. Please advise.

## 2019-07-01 NOTE — Telephone Encounter (Signed)
Pt due for f/u. Please contact daughter to schedule.

## 2019-07-01 NOTE — Telephone Encounter (Signed)
Patient is scheduled on 07/15/19 at 10 am with Dr. Yong Channel.

## 2019-07-06 ENCOUNTER — Other Ambulatory Visit: Payer: Self-pay

## 2019-07-06 ENCOUNTER — Encounter: Payer: Self-pay | Admitting: Family Medicine

## 2019-07-06 ENCOUNTER — Ambulatory Visit (INDEPENDENT_AMBULATORY_CARE_PROVIDER_SITE_OTHER): Payer: Medicare Other | Admitting: Family Medicine

## 2019-07-06 VITALS — BP 124/62 | HR 62 | Temp 97.5°F | Ht 59.25 in | Wt 145.6 lb

## 2019-07-06 DIAGNOSIS — E663 Overweight: Secondary | ICD-10-CM

## 2019-07-06 DIAGNOSIS — E034 Atrophy of thyroid (acquired): Secondary | ICD-10-CM | POA: Diagnosis not present

## 2019-07-06 DIAGNOSIS — R300 Dysuria: Secondary | ICD-10-CM | POA: Diagnosis not present

## 2019-07-06 DIAGNOSIS — I10 Essential (primary) hypertension: Secondary | ICD-10-CM

## 2019-07-06 DIAGNOSIS — I25119 Atherosclerotic heart disease of native coronary artery with unspecified angina pectoris: Secondary | ICD-10-CM

## 2019-07-06 LAB — POCT URINALYSIS DIPSTICK
Bilirubin, UA: NEGATIVE
Blood, UA: NEGATIVE
Glucose, UA: NEGATIVE
Ketones, UA: NEGATIVE
Nitrite, UA: NEGATIVE
Protein, UA: NEGATIVE
Spec Grav, UA: 1.02 (ref 1.010–1.025)
Urobilinogen, UA: 1 E.U./dL
pH, UA: 6 (ref 5.0–8.0)

## 2019-07-06 MED ORDER — NITROFURANTOIN MONOHYD MACRO 100 MG PO CAPS: 100.0000 mg | ORAL_CAPSULE | Freq: Two times a day (BID) | ORAL | 0 refills | Status: AC

## 2019-07-06 NOTE — Patient Instructions (Addendum)
Start nitrofurantoin twice a day for 7 days  We will get culture to make sure right antibiotic and should have that back by Friday/saturday.   If has new or worsening symptoms please let us know

## 2019-07-06 NOTE — Progress Notes (Signed)
Phone [phone]   Subjective:  Marikay AlarJennie G Kostick is a 85 y.o. year old very pleasant female patient who presents for/with See problem oriented charting Chief Complaint  Patient presents with  . Dysuria   ROS-see ROS below  Past Medical History-  Patient Active Problem List   Diagnosis Date Noted  . Alzheimer's dementia (HCC) 10/22/2017    Priority: High  . History of stroke     Priority: High  . History of transient ischemic attack (TIA) 05/16/2015    Priority: High  . CAD (coronary artery disease) 04/05/2008    Priority: High  . Age-related osteoporosis without current pathological fracture 10/22/2017    Priority: Medium  . High bilirubin 02/17/2017    Priority: Medium  . Hyperglycemia 08/28/2015    Priority: Medium  . Overactive bladder 08/14/2014    Priority: Medium  . Essential hypertension 01/17/2010    Priority: Medium  . Hyperlipidemia 01/15/2009    Priority: Medium  . ADJUSTMENT DISORDER WITH ANXIOUS MOOD 01/15/2009    Priority: Medium  . Hypothyroidism 04/05/2008    Priority: Medium  . IBS (irritable bowel syndrome) 04/05/2008    Priority: Medium  . Lumbar pain with radiation down left leg 02/21/2011    Priority: Low  . CERUMEN IMPACTION, BILATERAL 11/27/2010    Priority: Low  . GERD 04/05/2008    Priority: Low  . INTERNAL HEMORRHOIDS 06/29/2002    Priority: Low  . Coronary artery disease involving native coronary artery of native heart with angina pectoris (HCC) 07/06/2019  . Intracranial vascular stenosis 07/02/2015    Medications- reviewed and updated Current Outpatient Medications  Medication Sig Dispense Refill  . amLODipine (NORVASC) 5 MG tablet TAKE 1 TABLET BY MOUTH ONCE DAILY 90 tablet 1  . clopidogrel (PLAVIX) 75 MG tablet TAKE 1 TABLET BY MOUTH  DAILY 90 tablet 1  . escitalopram (LEXAPRO) 10 MG tablet Take 1 tablet by mouth once daily 90 tablet 0  . SYNTHROID 100 MCG tablet TAKE 1 TABLET BY MOUTH  DAILY 90 tablet 0  . nitrofurantoin,  macrocrystal-monohydrate, (MACROBID) 100 MG capsule Take 1 capsule (100 mg total) by mouth 2 (two) times daily for 7 days. 14 capsule 0   No current facility-administered medications for this visit.      Objective:  BP 124/62 (BP Location: Left Arm, Patient Position: Sitting, Cuff Size: Normal)   Pulse 62   Temp (!) 97.5 F (36.4 C) (Oral)   Ht 4' 11.25" (1.505 m)   Wt 145 lb 9.6 oz (66 kg)   LMP  (LMP Unknown)   SpO2 95%   BMI 29.16 kg/m  Gen: NAD, resting comfortably CV: RRR  Lungs: nonlabored, normal respiratory rate Abdomen: soft/nondistended Ext: no edema Skin: warm, dry Neuro: Looks to daughter for most answers but answers a few on her own Results for orders placed or performed in visit on 07/06/19 (from the past 24 hour(s))  POCT urinalysis dipstick     Status: Abnormal   Collection Time: 07/06/19 11:57 AM  Result Value Ref Range   Color, UA Yellow    Clarity, UA Cloudy    Glucose, UA Negative Negative   Bilirubin, UA Negative    Ketones, UA Negative    Spec Grav, UA 1.020 1.010 - 1.025   Blood, UA Negative    pH, UA 6.0 5.0 - 8.0   Protein, UA Negative Negative   Urobilinogen, UA 1.0 0.2 or 1.0 E.U./dL   Nitrite, UA Negative    Leukocytes, UA Trace (A) Negative  Appearance     Odor        Assessment and Plan   #Concern for UTI S: Patients symptoms started over the weekend. .  Complains of dysuria: incomplete void; polyuria, increased urinary urgency.  Symptoms are Denies flank pain, pelvic pain/pressure, fever.  ROS- no fever, chills, nausea, vomiting, flank pain. No blood in urine.  A/P: UA  concerning. Likely UTI. Will get culture. Empiric treatment with: nitrofurantoin- did well with this and will be short term so I think geriatric risks overall low. PCN allergy and anxious about trying keflex Patient to follow up if new or worsening symptoms or failure to improve.   #hypertension/CAD/history of stroke on Plavix S: controlled in office today on  amlodipine 5 mg.  Home readings slightly elevated 141.6/61.8 over last 15 readings  Patient has some shortness of breath with moving around the house but not worsening recently-daughter thinks related to sedentary activity overall.  She remains on Plavix BP Readings from Last 3 Encounters:  07/06/19 124/62  02/09/19 (!) 126/57  01/17/19 (!) 146/54  A/P: Family is anxious about fall risk for patient if we increase medications despite all numbers being slightly high and coronary artery disease history-since blood pressure looks good today we opted to continue current medications  For shortness of breath-encouraged cardiology follow-up but patient/family declined for now.  They will let me know if she has worsening symptoms.  Patient's daughter reports that she was taken off of Praluent and shared decision making with cardiology.  Obviously this increases her cardiac risk but goal is to increase comfort and minimize medications with advanced dementia.  #hypothyroidism S: On thyroid medication-levothyroxine 100 mcg Lab Results  Component Value Date   TSH 3.12 01/17/2019   A/P: Stable. Continue current medications.    #Alzheimer's dementia S: Patient has seen neurology in the past but does not want a follow-up.  She did not consistently take any medications so these were simply removed.  She is monitored either by camera or in person 24 hours-Caregiver until 1 30 then daughter at 64 and has camera at home.  A/P: Family is doing a great job with patient- still wonder if she will have to move into a facility eventually.  Daughter is trying to prevent this- she is going to retire from teaching in the fall- I wrote FMLA for her to try to keep her at home until October 1 due to COVID-19 risks and need for her to provide care for her mother as well as follow-up for appointments if needed.  Patient noted be overweight-advised more regular activity/movement at home as long as no worsening cardiac symptoms.   Recommended follow up: They strongly prefer PRN follow-up No future appointments.  Lab/Order associations:   ICD-10-CM   1. Dysuria  R30.0 POCT urinalysis dipstick    Urine Culture  2. Hypothyroidism due to acquired atrophy of thyroid  E03.4   3. Essential hypertension  I10   4. Coronary artery disease involving native coronary artery of native heart with angina pectoris (HCC) Chronic I25.119   5. Overweight  E66.3     Meds ordered this encounter  Medications  . nitrofurantoin, macrocrystal-monohydrate, (MACROBID) 100 MG capsule    Sig: Take 1 capsule (100 mg total) by mouth 2 (two) times daily for 7 days.    Dispense:  14 capsule    Refill:  0   Return precautions advised.  Garret Reddish, MD

## 2019-07-08 LAB — URINE CULTURE
MICRO NUMBER:: 669812
Result:: NO GROWTH
SPECIMEN QUALITY:: ADEQUATE

## 2019-07-15 ENCOUNTER — Ambulatory Visit: Payer: Medicare Other | Admitting: Family Medicine

## 2019-08-26 ENCOUNTER — Other Ambulatory Visit: Payer: Self-pay | Admitting: Family Medicine

## 2019-09-07 ENCOUNTER — Other Ambulatory Visit: Payer: Self-pay | Admitting: Family Medicine

## 2019-10-14 ENCOUNTER — Emergency Department (HOSPITAL_COMMUNITY): Payer: Medicare Other

## 2019-10-14 ENCOUNTER — Ambulatory Visit: Payer: Self-pay

## 2019-10-14 ENCOUNTER — Other Ambulatory Visit: Payer: Self-pay

## 2019-10-14 ENCOUNTER — Encounter: Payer: Self-pay | Admitting: Family Medicine

## 2019-10-14 ENCOUNTER — Ambulatory Visit (INDEPENDENT_AMBULATORY_CARE_PROVIDER_SITE_OTHER): Payer: Medicare Other | Admitting: Family Medicine

## 2019-10-14 ENCOUNTER — Encounter (HOSPITAL_COMMUNITY): Payer: Self-pay | Admitting: Emergency Medicine

## 2019-10-14 ENCOUNTER — Emergency Department (HOSPITAL_COMMUNITY)
Admission: EM | Admit: 2019-10-14 | Discharge: 2019-10-14 | Disposition: A | Discharge status: Home / Self Care | Payer: Medicare Other | Attending: Emergency Medicine | Admitting: Emergency Medicine

## 2019-10-14 VITALS — BP 142/61 | HR 64

## 2019-10-14 DIAGNOSIS — F0280 Dementia in other diseases classified elsewhere without behavioral disturbance: Secondary | ICD-10-CM

## 2019-10-14 DIAGNOSIS — R0602 Shortness of breath: Secondary | ICD-10-CM

## 2019-10-14 DIAGNOSIS — Z20828 Contact with and (suspected) exposure to other viral communicable diseases: Secondary | ICD-10-CM | POA: Diagnosis not present

## 2019-10-14 DIAGNOSIS — I2510 Atherosclerotic heart disease of native coronary artery without angina pectoris: Secondary | ICD-10-CM | POA: Diagnosis not present

## 2019-10-14 DIAGNOSIS — I10 Essential (primary) hypertension: Secondary | ICD-10-CM | POA: Insufficient documentation

## 2019-10-14 DIAGNOSIS — R0600 Dyspnea, unspecified: Secondary | ICD-10-CM | POA: Insufficient documentation

## 2019-10-14 DIAGNOSIS — E039 Hypothyroidism, unspecified: Secondary | ICD-10-CM | POA: Insufficient documentation

## 2019-10-14 DIAGNOSIS — G309 Alzheimer's disease, unspecified: Secondary | ICD-10-CM | POA: Diagnosis not present

## 2019-10-14 DIAGNOSIS — Z87891 Personal history of nicotine dependence: Secondary | ICD-10-CM | POA: Insufficient documentation

## 2019-10-14 DIAGNOSIS — E785 Hyperlipidemia, unspecified: Secondary | ICD-10-CM | POA: Diagnosis not present

## 2019-10-14 DIAGNOSIS — Z79899 Other long term (current) drug therapy: Secondary | ICD-10-CM | POA: Insufficient documentation

## 2019-10-14 DIAGNOSIS — Z7901 Long term (current) use of anticoagulants: Secondary | ICD-10-CM | POA: Diagnosis not present

## 2019-10-14 LAB — BASIC METABOLIC PANEL
Anion gap: 10 (ref 5–15)
BUN: 11 mg/dL (ref 8–23)
CO2: 24 mmol/L (ref 22–32)
Calcium: 9 mg/dL (ref 8.9–10.3)
Chloride: 102 mmol/L (ref 98–111)
Creatinine, Ser: 0.51 mg/dL (ref 0.44–1.00)
GFR calc Af Amer: >60 mL/min (ref >60)
GFR calc non Af Amer: >60 mL/min (ref >60)
Glucose, Bld: 99 mg/dL (ref 70–99)
Potassium: 5 mmol/L (ref 3.5–5.1)
Sodium: 136 mmol/L (ref 135–145)

## 2019-10-14 LAB — CBC
HCT: 47.9 % — ABNORMAL HIGH (ref 36.0–46.0)
Hemoglobin: 16.5 g/dL — ABNORMAL HIGH (ref 12.0–15.0)
MCH: 29.9 pg (ref 26.0–34.0)
MCHC: 34.4 g/dL (ref 30.0–36.0)
MCV: 86.9 fL (ref 80.0–100.0)
Platelets: 313 10*3/uL (ref 150–400)
RBC: 5.51 MIL/uL — ABNORMAL HIGH (ref 3.87–5.11)
RDW: 13.9 % (ref 11.5–15.5)
WBC: 7.2 10*3/uL (ref 4.0–10.5)
nRBC: 0 % (ref 0.0–0.2)

## 2019-10-14 LAB — SARS CORONAVIRUS 2 (TAT 6-24 HRS): SARS Coronavirus 2: NEGATIVE

## 2019-10-14 LAB — BRAIN NATRIURETIC PEPTIDE: B Natriuretic Peptide: 74.5 pg/mL (ref 0.0–100.0)

## 2019-10-14 LAB — TROPONIN I (HIGH SENSITIVITY): Troponin I (High Sensitivity): 14 ng/L (ref <18)

## 2019-10-14 NOTE — ED Notes (Signed)
Daughter adds having weakness and muscle fatigue.

## 2019-10-14 NOTE — ED Provider Notes (Signed)
[facility] DEPT Provider Note   CSN: [id number] Arrival date & time: 10/14/19  1314     History   Chief Complaint Chief Complaint  Patient presents with  . Shortness of Breath    HPI Joyce [name] is a 85 y.o. female.     HPI Pt has been having trouble with shortness of breath over the last month.   She has also been having trouble with fatigue, poor appetite.  She has coughed a couple of times but not much.  No fever.   No complaints of chest pain.  No more leg swelling than usual.  Family called the doctor today who suggested she come to the ED. Past Medical History:  Diagnosis Date  . Adjustment disorder with anxiety STRESS CHEST DISCOMFORT   PT RECENTLY PUT HUSBAND IN NURSING HOME  . Alzheimer's dementia ([place]) 10/22/2017  . Borderline glaucoma   . Candidiasis of skin and nails   . Coronary atherosclerosis of unspecified type of vessel, native or graft CARDIOLOGIST- DR [facility]   DENIES S & S  . GERD 04/05/2008  . GERD (gastroesophageal reflux disease)   . History of echocardiogram    Echo 8/18: EF 60-65, no RWMA, MAC  . History of sleep apnea YRS AGO ON CPAP UNTIL LOST WT  . Hyperlipidemia   . Hypothyroidism   . IBS (irritable bowel syndrome)   . Internal hemorrhoids without mention of complication   . Intracranial vascular stenosis 07/02/2015  . Lesion of bladder   . Nocturia   . Numbness and tingling of foot LEFT -- SECONDARY TO PINCHED LUMBAR NERVE  . Overactive bladder 08/14/2014  . Pinched nerve LUMBAR   RESIDUAL LEFT FOOT NUMBNESS/ TINGLING  . Scoliosis   . Sinusitis 09/29/2013  . Stress incontinence   . Unspecified essential hypertension     Patient Active Problem List   Diagnosis Date Noted  . Coronary artery disease involving native coronary artery of native heart with angina pectoris ([place]) 07/06/2019  . Alzheimer's dementia ([place]) 10/22/2017  . Age-related osteoporosis without current pathological fracture 10/22/2017   . High bilirubin 02/17/2017  . Hyperglycemia 08/28/2015  . Intracranial vascular stenosis 07/02/2015  . History of stroke   . History of transient ischemic attack (TIA) 05/16/2015  . Overactive bladder 08/14/2014  . Lumbar pain with radiation down left leg 02/21/2011  . CERUMEN IMPACTION, BILATERAL 11/27/2010  . Essential hypertension 01/17/2010  . Hyperlipidemia 01/15/2009  . ADJUSTMENT DISORDER WITH ANXIOUS MOOD 01/15/2009  . Hypothyroidism 04/05/2008  . CAD (coronary artery disease) 04/05/2008  . GERD 04/05/2008  . IBS (irritable bowel syndrome) 04/05/2008  . INTERNAL HEMORRHOIDS 06/29/2002    Past Surgical History:  Procedure Laterality Date  . CARDIAC CATHETERIZATION  01-10-2003   DR Gwyndolyn Saxon DOWNEY   PATENT CIRCUMFLEX STENT/ MODERATE CAD ELSEWHERE  . CARDIOVASCULAR STRESS TEST  07-27-2012   NORMAL NUCLEAR STUDY/ LVEF 81%  . CATARACT EXTRACTION W/ INTRAOCULAR LENS  IMPLANT, BILATERAL    . CHOLECYSTECTOMY  1976  . CORONARY ANGIOPLASTY WITH STENT PLACEMENT  08-31-2001   DR Darnell Level BRODIE   STENTING OF PROXIMAL CIRCUMFLEX (75% TO 0%)   . CYSTOSCOPY WITH BIOPSY  08/02/2012   Procedure: CYSTOSCOPY WITH BIOPSY;  Surgeon: Molli Hazard, MD;  Location: [facility];  Service: Urology;  Laterality: N/A;  . VAGINAL HYSTERECTOMY  1968     OB History   No obstetric history on file.      Home Medications    Prior to  Admission medications   Medication Sig Start Date End Date Taking? Authorizing Provider  amLODipine (NORVASC) 5 MG tablet TAKE 1 TABLET BY MOUTH ONCE DAILY 06/23/19   Marin Olp, MD  clopidogrel (PLAVIX) 75 MG tablet TAKE 1 TABLET BY MOUTH  DAILY 06/23/19   Marin Olp, MD  escitalopram (LEXAPRO) 10 MG tablet Take 1 tablet by mouth once daily 09/07/19   Marin Olp, MD  SYNTHROID 100 MCG tablet TAKE 1 TABLET BY MOUTH  DAILY 08/26/19   Marin Olp, MD    Family History Family History  Problem Relation Age of Onset  . Heart  disease Mother        per patient died of old age at 59  . CAD Father        massive MI age 74    Social History Social History   Tobacco Use  . Smoking status: Former Smoker    Packs/day: 1.50    Years: 20.00    Pack years: 30.00    Types: Cigarettes    Quit date: 07/31/1983    Years since quitting: 36.2  . Smokeless tobacco: Never Used  Substance Use Topics  . Alcohol use: No  . Drug use: No     Allergies   Loratadine, Zoloft [sertraline hcl], Adhesive [tape], Aspirin, Levaquin [levofloxacin in d5w], Levofloxacin, Lumigan [bimatoprost], Mobic [meloxicam], Penicillins, and Statins   Review of Systems Review of Systems  All other systems reviewed and are negative.    Physical Exam Updated Vital Signs BP (!) 164/62 (BP Location: Left Arm)   Pulse 63   Resp (!) 23   LMP  (LMP Unknown)   SpO2 95%   Physical Exam Vitals signs and nursing note reviewed.  Constitutional:      General: She is not in acute distress. HENT:     Head: Normocephalic and atraumatic.     Right Ear: External ear normal.     Left Ear: External ear normal.  Eyes:     General: No scleral icterus.       Right eye: No discharge.        Left eye: No discharge.     Conjunctiva/sclera: Conjunctivae normal.  Neck:     Musculoskeletal: Neck supple.     Trachea: No tracheal deviation.  Cardiovascular:     Rate and Rhythm: Normal rate and regular rhythm.  Pulmonary:     Effort: Pulmonary effort is normal. No respiratory distress.     Breath sounds: Normal breath sounds. No stridor. No wheezing or rales.  Abdominal:     General: Bowel sounds are normal. There is no distension.     Palpations: Abdomen is soft.     Tenderness: There is no abdominal tenderness. There is no guarding or rebound.  Musculoskeletal:        General: No tenderness.  Skin:    General: Skin is warm and dry.     Findings: No rash.  Neurological:     Mental Status: She is alert.     Cranial Nerves: No cranial nerve  deficit (no facial droop, extraocular movements intact, no slurred speech).     Sensory: No sensory deficit.     Motor: No abnormal muscle tone or seizure activity.     Coordination: Coordination normal.      ED Treatments / Results  Labs (all labs ordered are listed, but only abnormal results are displayed) Labs Reviewed  CBC - Abnormal; Notable for the following components:  Result Value   RBC 5.51 (*)    Hemoglobin 16.5 (*)    HCT 47.9 (*)    All other components within normal limits  SARS CORONAVIRUS 2 (TAT 6-24 HRS)  BASIC METABOLIC PANEL  BRAIN NATRIURETIC PEPTIDE  TROPONIN I (HIGH SENSITIVITY)    EKG EKG Interpretation  Date/Time:  Friday October 14 2019 13:42:17 EDT Ventricular Rate:  63 PR Interval:    QRS Duration: 144 QT Interval:  481 QTC Calculation: 493 R Axis:   -86 Text Interpretation:  Sinus rhythm RBBB and LAFB No significant change since last tracing 18 minutes earlier Confirmed by Dorie Rank [phone]) on 10/14/2019 1:47:30 PM   Radiology Dg Chest Portable 1 View  Result Date: 10/14/2019 CLINICAL DATA:  Pt sent from PCP to ED for further evaluation of SOB with exertion. Pt's daughter stated that the pt's oxygen level was 91% and went up after about 15 minutes. Pt's daughter also reported progressively worsening weakness, SOB and muscle fatigue over the past month. Medical hx of HTN. EXAM: PORTABLE CHEST 1 VIEW COMPARISON:  05/16/2015 FINDINGS: Cardiac silhouette is mildly enlarged. No mediastinal or hilar masses. No evidence of adenopathy. Lungs are clear. No convincing pleural effusion and no pneumothorax. Skeletal structures are demineralized but grossly intact. IMPRESSION: No acute cardiopulmonary disease. Electronically Signed   By: Lajean Manes M.D.   On: 10/14/2019 14:53    Procedures Procedures (including critical care time)  Medications Ordered in ED Medications - No data to display   Initial Impression / Assessment and Plan / ED Course   I have reviewed the triage vital signs and the nursing notes.  Pertinent labs & imaging results that were available during my care of the patient were reviewed by me and considered in my medical decision making (see chart for details).   Patient presented to the ED for evaluation of shortness of breath.  Patient's had some intermittent episodes.  She had an electronic visit with her doctor who felt like she needed further evaluation in the ED.  Patient's ED work-up is reassuring.  She is not having any discomfort.  She is calm and comfortable.  Her vital signs are normal.  Her oxygenation is normal.  Laboratory tests and x-rays are reassuring.  No signs to suggest acute cardiac ischemia.  No signs of pneumonia.  No signs of CHF.  Discussed these findings with the daughter.  I did explain that we did not do an evaluation for pulmonary embolism but she is not have any leg swelling and she is not having any difficulty with her breathing at this time so I think it is low probability and unlikely.  Patient's daughter is comfortable with our evaluation.  She will take the patient home.  Patient could have further evaluation by cardiology with echocardiogram or possible outpatient cardiac monitor.  Patient will follow up with her primary care doctor to discuss further.  Final Clinical Impressions(s) / ED Diagnoses   Final diagnoses:  Dyspnea, unspecified type    ED Discharge Orders    None       Dorie Rank, MD 10/14/19 1605

## 2019-10-14 NOTE — Telephone Encounter (Signed)
Pt's daughter called to report worsening shortness of breath over past 2 weeks.  Daughter is not with pt. at this time.  Stated the pt. Is currently at her home, asleep, and has a Caregiver with her.  Daughter reported she noted her mother had shortness of breath last evening, while walking from bathroom to the couch, and took awhile to recover from the SOB.  Stated "there is always some swelling in her feet and legs, but it has not worsened."  Denied any fever, cough, or wheezing with the SOB.  Denied any c/o chest pain.  Reported pt. Has been sleeping a lot more.  Daughter stated since pt. not awake, she does not have any update this AM, as to how she is doing.  Caregiver has reported she is sleeping soundly at this time.   Called Pharmacist, community; spoke with [place].  Transferred pt's daugther to [facility] for scheduling an appt.  Daughter agreed with plan.   Reason for Disposition . [1] MILD difficulty breathing (e.g., minimal/no SOB at rest, SOB with walking, pulse <100) AND [2] NEW-onset or WORSE than normal    Daughter reported shortness of breath with activity, that has progressed from mild to severe over past two weeks.  Answer Assessment - Initial Assessment Questions 1. RESPIRATORY STATUS: "Describe your breathing?" (e.g., wheezing, shortness of breath, unable to speak, severe coughing)     Short of breath last night with walking from BR to couch 2. ONSET: "When did this breathing problem begin?"      Over past 2 weeks 3. PATTERN "Does the difficult breathing come and go, or has it been constant since it started?"      Was coming and going and now more consistent 4. SEVERITY: "How bad is your breathing?" (e.g., mild, moderate, severe)    - MILD: No SOB at rest, mild SOB with walking, speaks normally in sentences, can lay down, no retractions, pulse < 100.    - MODERATE: SOB at rest, SOB with minimal exertion and prefers to sit, cannot lie down flat, speaks in phrases, mild retractions, audible  wheezing, pulse 100-120.    - SEVERE: Very SOB at rest, speaks in single words, struggling to breathe, sitting hunched forward, retractions, pulse > 120      Was mild and has progressed to severe  5. RECURRENT SYMPTOM: "Have you had difficulty breathing before?" If so, ask: "When was the last time?" and "What happened that time?"      Daughter is not sure.  6. CARDIAC HISTORY: "Do you have any history of heart disease?" (e.g., heart attack, angina, bypass surgery, angioplasty)     Has hx of heart problems; daughter thinks she has had a mild heart attack 7. LUNG HISTORY: "Do you have any history of lung disease?"  (e.g., pulmonary embolus, asthma, emphysema)     No chronic lung problems 8. CAUSE: "What do you think is causing the breathing problem?"      unknown 9. OTHER SYMPTOMS: "Do you have any other symptoms? (e.g., dizziness, runny nose, cough, chest pain, fever)    Sleeping more than usual; always some swelling in feet and legs/ not worse; denied cough, no c/o chest pain, flushed last night; did not check temp.; c/o being exhausted.  10. PREGNANCY: "Is there any chance you are pregnant?" "When was your last menstrual period?"       N/a  11. TRAVEL: "Have you traveled out of the country in the last month?" (e.g., travel history, exposures)  N/a  Protocols used: BREATHING DIFFICULTY-A-AH

## 2019-10-14 NOTE — ED Notes (Signed)
Pts daughter states patient has had a loss of appetite for past 3 days. Not eating or drinking. She states mom has increased fatigue, mom going to bed around 7pm and having difficulty awaking in the morning.

## 2019-10-14 NOTE — Discharge Instructions (Addendum)
Follow up with your doctor as we discussed.  Return to the as needed for worsening symptoms

## 2019-10-14 NOTE — Progress Notes (Signed)
Phone [phone]   Subjective:  Virtual visit via Video note. Chief complaint: Chief Complaint  Patient presents with  . Shortness of Breath   This visit type was conducted due to national recommendations for restrictions regarding the COVID-19 Pandemic (e.g. social distancing).  This format is felt to be most appropriate for this patient at this time balancing risks to patient and risks to population by having him in for in person visit.  No physical exam was performed (except for noted visual exam or audio findings with Telehealth visits).    Our team/I connected with Joyce [name] at 11:00 AM EDT by a video enabled telemedicine application (doxy.me or caregility through epic) and verified that I am speaking with the correct person using two identifiers.  Location patient: [facility] Location provider: [facility], office Persons participating in the virtual visit:  patient, daughter provides vast majority of history  Our team/I discussed the limitations of evaluation and management by telemedicine and the availability of in person appointments. In light of current covid-19 pandemic, patient also understands that we are trying to protect them by minimizing in office contact if at all possible.  The patient expressed consent for telemedicine visit and agreed to proceed. Patient understands insurance will be billed.   ROS-level 5 caveat applies due to dementia-history provided by daughter-no reported fever/chills/vomiting.  Does report shortness of breath.  No obvious indicators of chest pain  Past Medical History-  Patient Active Problem List   Diagnosis Date Noted  . Alzheimer's dementia ([facility]) 10/22/2017    Priority: High  . History of stroke     Priority: High  . History of transient ischemic attack (TIA) 05/16/2015    Priority: High  . CAD (coronary artery disease) 04/05/2008    Priority: High  . Age-related osteoporosis without current pathological fracture 10/22/2017   Priority: Medium  . High bilirubin 02/17/2017    Priority: Medium  . Hyperglycemia 08/28/2015    Priority: Medium  . Overactive bladder 08/14/2014    Priority: Medium  . Essential hypertension 01/17/2010    Priority: Medium  . Hyperlipidemia 01/15/2009    Priority: Medium  . ADJUSTMENT DISORDER WITH ANXIOUS MOOD 01/15/2009    Priority: Medium  . Hypothyroidism 04/05/2008    Priority: Medium  . IBS (irritable bowel syndrome) 04/05/2008    Priority: Medium  . Lumbar pain with radiation down left leg 02/21/2011    Priority: Low  . CERUMEN IMPACTION, BILATERAL 11/27/2010    Priority: Low  . GERD 04/05/2008    Priority: Low  . INTERNAL HEMORRHOIDS 06/29/2002    Priority: Low  . Coronary artery disease involving native coronary artery of native heart with angina pectoris ([facility]) 07/06/2019  . Intracranial vascular stenosis 07/02/2015    Medications- reviewed and updated Current Outpatient Medications  Medication Sig Dispense Refill  . amLODipine (NORVASC) 5 MG tablet TAKE 1 TABLET BY MOUTH ONCE DAILY 90 tablet 1  . clopidogrel (PLAVIX) 75 MG tablet TAKE 1 TABLET BY MOUTH  DAILY 90 tablet 1  . escitalopram (LEXAPRO) 10 MG tablet Take 1 tablet by mouth once daily 90 tablet 0  . SYNTHROID 100 MCG tablet TAKE 1 TABLET BY MOUTH  DAILY 90 tablet 3   No current facility-administered medications for this visit.      Objective:  BP (!) 142/61   Pulse 64   LMP  (LMP Unknown)   SpO2 92%  self reported vitals. Has not taken temperature.  Gen: NAD, resting comfortably in her chair Lungs: Slightly  increased respiratory rate Skin: appears dry, no obvious rash     Assessment and Plan   # Shortness of breath S: yesterday patient went to get her hair done- was very short of breath in the morning. had been having worsening shortness of breath over last several days- starting Tuesday night particularly worsened but has been worse over last month.  Was having issues even walking to the  bathroom. Last night was feeling flushed and short of breath just sitting on the couch. Took 15 minutes breathing heavily on couch to feel better and stated did nt know why so short of breath. No significant swelling in the legs. No cough. She is more fatigued than normal.   Last visit with Dr. Burt Knack was in April. Has not complianed of chest pain. No left arm or neck pain. No diaphoresis. No increased edema or weight gain.   Today has shortness of breath going to the bathroom but seems better overall.   Pulse ox going to the bathroom was 91%. She seems shaky and appetite is much lower.  A/P: 85 year old female with advanced dementia who was seen by palliative care and has a DNR and MOST form and does not want "heroic measures" but currently with shortness of breath and pulse oximetry of 92%.  From last palliative care note on 06/17/2019 "Advanced Care Directive: DNR and MOST in the home; MOST details: limited medical interventions. Antibiotics and IVFs determine at time of need. No feeding tube. "  Patient has known CAD and could be experiencing unstable angina.  She could also have pneumonia.  She could also have COVID-19.  We discussed potential outpatient management including COVID-19 testing and if negative having her in for chest x-ray but this would likely delay care significantly-in the end recommended visit to the emergency room.  Daughter agrees to take patient to the emergency room immediately  Patient will need the following most likely: 1.  Rapid COVID-19 testing as she may be a candidate for supplemental oxygen, dexamethasone, remdesivir potentially 2.  Chest x-ray to evaluate for pneumonia 3.  EKG and troponin given CAD history and worsening shortness of breath 4.  Consideration of BNP-no known heart failure history and no increased edema but this would be a reasonable step.  CBC to evaluate for anemia.  Alzheimer's disease noted-making history taking more difficult  CAD-could  potentially be having unstable angina with worsening shortness of breath pattern.  Hypertension-mild poor control given CAD but is acutely ill so we will not make adjustments at present  Hyperlipidemia-has been controlled on statin-continue statin for now  Lab/Order associations:   ICD-10-CM   1. Shortness of breath  R06.02   2. Alzheimer's dementia without behavioral disturbance, unspecified timing of dementia onset ([place])  G30.9    F02.80   3. Coronary artery disease involving native coronary artery of native heart without angina pectoris  I25.10   4. Hyperlipidemia, unspecified hyperlipidemia type  E78.5   5. Essential hypertension  I10     Return precautions advised.  Garret Reddish, MD

## 2019-10-14 NOTE — Patient Instructions (Signed)
Health Maintenance Due  Topic Date Due  . INFLUENZA VACCINE  07/23/2019    - Flu shot today - High dose flu shot today - declines for this season - will complete later in flu season (please let us know if you get this at another location so we can update your chart)  

## 2019-10-14 NOTE — Telephone Encounter (Signed)
See note

## 2019-10-14 NOTE — ED Triage Notes (Signed)
Pt sent from PCP to ED for further evaluation of SOB with exertion. Daughter states that O2 level was 91% and went up after about 15 minutes.

## 2019-10-20 ENCOUNTER — Telehealth: Payer: Self-pay | Admitting: Family Medicine

## 2019-10-20 NOTE — Telephone Encounter (Signed)
See note

## 2019-10-20 NOTE — Telephone Encounter (Signed)
Pt's daughter in law called in to request a Rx for possible bladder infection. Pt is having Frequent urination, urge to urinate but cant go     Pharmacy:  [facility] [address], [place] - [place] [address] [phone] (Phone) [phone] (Fax)     Mechele Claude (daughter in law)  -  [phone]

## 2019-10-21 NOTE — Telephone Encounter (Signed)
Pt daughter-in-law Aleina Burgio requests call back regarding Rx. Cb# [phone]

## 2019-10-21 NOTE — Telephone Encounter (Signed)
Patients daughter in law Joyce [name] called and asked that she get a callback from nurse in regards to a status update on rx being calling in for UTI. She asked that medication be sent to  [id number] - [place], [place]   [phone] (Phone) [phone] (Fax)  Best contact for Joyce [name] is [phone]

## 2019-10-21 NOTE — Telephone Encounter (Signed)
See note

## 2019-10-24 NOTE — Telephone Encounter (Signed)
Left message to return call to our office.  

## 2019-10-25 NOTE — Telephone Encounter (Signed)
Left message to return call to our office.  

## 2019-10-26 NOTE — Telephone Encounter (Signed)
My chart has been sent. If no answer in 2 days will send letter.

## 2019-10-26 NOTE — Telephone Encounter (Signed)
Send mychart and if no response in 2 days send letter. I would advise a visit for patient for concern UTI- virtual ok to discuss concern. No UA or culture in ER noted

## 2019-10-26 NOTE — Telephone Encounter (Signed)
Have called x 3 to reach patient please advise if you want me to mail letter or continue to call.

## 2019-10-26 NOTE — Telephone Encounter (Signed)
Left message to return call to our office.  

## 2019-10-27 NOTE — Telephone Encounter (Signed)
Received my chart from daughter she is feeling better.

## 2019-11-23 ENCOUNTER — Telehealth: Payer: Self-pay

## 2019-11-23 NOTE — Telephone Encounter (Signed)
At the direction of [facility] NP, message sent to Dr. Yong Channel to provide update and to request order for Hospice evaluation and to verify that he will remain attending.

## 2019-11-23 NOTE — Telephone Encounter (Signed)
Received phone call from patient's daughter, Peter Congo, to discuss decline in patient. Dianna shared that patient has had a steady decline over the past few months. Meal intake had declined to smaller portions and refused to eat anything since last night. Dianna is unsure of recent weight. Per report of Dianna, patient's shortness of breath has worsened. Patient is sleeping more and requiring assistance with transfers. Patient is no longer able to recognize her home. Dianna shared that she does not want any further work up done but would like to focus on patient's comfort. Dianna is open to transitioning patient to hospice if appropriate. Will update primary Palliative NP.

## 2019-11-24 ENCOUNTER — Other Ambulatory Visit: Payer: Self-pay | Admitting: Family Medicine

## 2019-11-24 DIAGNOSIS — E785 Hyperlipidemia, unspecified: Secondary | ICD-10-CM

## 2019-11-24 DIAGNOSIS — I10 Essential (primary) hypertension: Secondary | ICD-10-CM

## 2019-11-24 DIAGNOSIS — I25118 Atherosclerotic heart disease of native coronary artery with other forms of angina pectoris: Secondary | ICD-10-CM

## 2019-11-29 ENCOUNTER — Telehealth: Payer: Self-pay | Admitting: Family Medicine

## 2019-11-29 MED ORDER — LORAZEPAM 0.5 MG PO TABS: 0.5000 mg | ORAL_TABLET | Freq: Every day | ORAL | 1 refills | Status: DC | PRN

## 2019-11-29 NOTE — Telephone Encounter (Signed)
See note

## 2019-11-29 NOTE — Telephone Encounter (Signed)
See below

## 2019-11-29 NOTE — Telephone Encounter (Signed)
Patient has taken Ativan in the past.  This does likely increase her fall risk but I think it is reasonable to retrial this if she has agitation as long as she has close supervision 24 hours or at least for 8 hours after taking medicine.  I assume with hospice patient is declining flu shot this year?  Please update after discussing with patient

## 2019-11-29 NOTE — Telephone Encounter (Signed)
Seth Bake, RN with hospice, calling to speak with Dr. Yong Channel or CMA regarding patient. She states that per patient's family, patient is experiencing "sun downing" or confusion at night. Seth Bake inquired if there is any medication that may possible help with this that can be prescribed. Please advise.

## 2019-11-30 MED ORDER — LORAZEPAM 0.5 MG PO TABS: 0.5000 mg | ORAL_TABLET | Freq: Two times a day (BID) | ORAL | 1 refills | Status: AC | PRN

## 2019-11-30 NOTE — Telephone Encounter (Signed)
I resent to loaded pharmacy

## 2019-11-30 NOTE — Telephone Encounter (Signed)
Called and spoke with Seth Bake and she will check with the family in regards to the Ativan dose and the flu shot and return my call.

## 2019-11-30 NOTE — Telephone Encounter (Signed)
Seth Bake returning call, states patient is declining flu shot since she is mainly homebound. She is [facility] [address]

## 2019-11-30 NOTE — Telephone Encounter (Signed)
Updated flu in chart.  I pended ativan at last dose she was on and put in correct pharmacy.

## 2019-11-30 NOTE — Telephone Encounter (Signed)
Called Seth Bake and let her know. She will call if any questions.

## 2019-12-02 ENCOUNTER — Telehealth: Payer: Self-pay | Admitting: Family Medicine

## 2019-12-02 NOTE — Telephone Encounter (Signed)
Pt needs a refill on lexapro 10 mg #30 with one refill . Pt has new pharm [facility] [address]

## 2019-12-05 ENCOUNTER — Other Ambulatory Visit: Payer: Self-pay

## 2019-12-05 MED ORDER — ESCITALOPRAM OXALATE 10 MG PO TABS: 10.0000 mg | ORAL_TABLET | Freq: Every day | ORAL | 1 refills | Status: DC

## 2019-12-05 NOTE — Telephone Encounter (Signed)
See note

## 2019-12-05 NOTE — Telephone Encounter (Signed)
Refill send into new pharmacy.

## 2019-12-08 ENCOUNTER — Telehealth: Payer: Self-pay | Admitting: Family Medicine

## 2019-12-08 NOTE — Telephone Encounter (Signed)
See note

## 2019-12-08 NOTE — Telephone Encounter (Signed)
Sam from [facility] called regarding medication LORazepam (ATIVAN) 0.5 MG tablet Patients daughter reached out regarding medication makes her very drowsy and falls asleep as soon as she takes medication.  Daughter is requesting something to give her during the day to help with patient not getting upset.  Sam Call back [phone]

## 2019-12-09 ENCOUNTER — Encounter: Payer: Self-pay | Admitting: Family Medicine

## 2019-12-09 ENCOUNTER — Other Ambulatory Visit: Payer: Self-pay | Admitting: Family Medicine

## 2019-12-09 MED ORDER — BUSPIRONE HCL 5 MG PO TABS: 5.0000 mg | ORAL_TABLET | Freq: Two times a day (BID) | ORAL | 5 refills | Status: DC | PRN

## 2019-12-09 NOTE — Telephone Encounter (Signed)
Called and l/m on both home phone and for Sam to call office.

## 2019-12-09 NOTE — Telephone Encounter (Signed)
Left message to return call to our office.  

## 2019-12-09 NOTE — Telephone Encounter (Signed)
Copied from [place] [phone]. Topic: Quick Communication - Rx Refill/Question >> Dec 09, 2019 11:36 AM Rainey Pines A wrote: Medication: busPIRone (BUSPAR) 5 MG tablet (Patient stated that medication was sen to wrong pharmacy.)  Has the patient contacted their pharmacy?Yes (Agent: If no, request that the patient contact the pharmacy for the refill.) (Agent: If yes, when and what did the pharmacy advise?)Contact PCP  Preferred Pharmacy (with phone number or street name): [id number] - [place], [address]  Phone:  [phone] Fax:  [phone]     Agent: Please be advised that RX refills may take up to 3 business days. We ask that you follow-up with your pharmacy.

## 2019-12-09 NOTE — Telephone Encounter (Signed)
Please advise 

## 2019-12-09 NOTE — Telephone Encounter (Signed)
Try low-dose buspirone during the day.  Can still use lorazepam at night

## 2019-12-12 ENCOUNTER — Telehealth: Payer: Self-pay | Admitting: Family Medicine

## 2019-12-12 NOTE — Telephone Encounter (Signed)
Pt has been very confused and struggling to get her calmed down starting around 3pm daily. There is a potential drug interaction with the buspar and lexapro causing seratonin syndrome and the daughter does not want to give her these 2 medications together after reading up on this. Pt took an Ativan last night to try and calm her down and it took a while to calm her down. They want her to have something to have to kind of help that dose notmake her sleepy because that's so early in the day. Pt daughter wants to try seroquil for pt early in the evening to see if that can calm her down. Pt daughter will be there to monitor her if this is Rx'd.

## 2019-12-12 NOTE — Telephone Encounter (Signed)
I think the probability of serotonin syndrome is low-I am well aware of this potential side effect.  If family would like to discuss options-I suggest we do another virtual visit-I am not opposed to Seroquel but I think we should chat about it before starting it

## 2019-12-12 NOTE — Telephone Encounter (Signed)
Copied from [place] [phone]. Topic: General - Other >> Dec 12, 2019 12:37 PM Keene Breath wrote: Reason for CRM: Daughter said that there is a drug interaction with one of the patient's medications.  Please advise and call to discuss at [phone]

## 2019-12-12 NOTE — Telephone Encounter (Signed)
See note

## 2019-12-13 ENCOUNTER — Ambulatory Visit (INDEPENDENT_AMBULATORY_CARE_PROVIDER_SITE_OTHER): Admitting: Family Medicine

## 2019-12-13 ENCOUNTER — Other Ambulatory Visit: Payer: Self-pay

## 2019-12-13 ENCOUNTER — Encounter: Payer: Self-pay | Admitting: Family Medicine

## 2019-12-13 VITALS — Ht 59.25 in | Wt 131.0 lb

## 2019-12-13 DIAGNOSIS — F0280 Dementia in other diseases classified elsewhere without behavioral disturbance: Secondary | ICD-10-CM

## 2019-12-13 DIAGNOSIS — F419 Anxiety disorder, unspecified: Secondary | ICD-10-CM

## 2019-12-13 DIAGNOSIS — G309 Alzheimer's disease, unspecified: Secondary | ICD-10-CM

## 2019-12-13 MED ORDER — QUETIAPINE FUMARATE 25 MG PO TABS: 12.5000 mg | ORAL_TABLET | Freq: Every day | ORAL | 5 refills | Status: DC

## 2019-12-13 NOTE — Progress Notes (Signed)
Phone [phone] Virtual visit via Video note   Subjective:  Chief complaint: Chief Complaint  Patient presents with  . Follow-up    This visit type was conducted due to national recommendations for restrictions regarding the COVID-19 Pandemic (e.g. social distancing).  This format is felt to be most appropriate for this patient at this time balancing risks to patient and risks to population by having him in for in person visit.  No physical exam was performed (except for noted visual exam or audio findings with Telehealth visits).    Our team/I connected with Joyce [name] at  3:20 PM EST by a video enabled telemedicine application (doxy.me or caregility through epic) and verified that I am speaking with the correct person using two identifiers.  Location patient: [facility] Location provider: [facility], office Persons participating in the virtual visit:  patient  Our team/I discussed the limitations of evaluation and management by telemedicine and the availability of in person appointments. In light of current covid-19 pandemic, patient also understands that we are trying to protect them by minimizing in office contact if at all possible.  The patient expressed consent for telemedicine visit and agreed to proceed. Patient understands insurance will be billed.   ROS- Review of Systems  Constitutional: Negative.   HENT: Positive for hearing loss.        Increased recently   Eyes: Negative.   Respiratory: Positive for shortness of breath.   Cardiovascular: Positive for leg swelling.       Ongoing swelling in legs  Now on 2 l of oxygen with hospice as needed. On average daily at least twice a day   Gastrointestinal: Positive for diarrhea.       Once last week no other issues.   Genitourinary: Negative.   Musculoskeletal: Negative.   Skin: Negative.   Neurological: Negative.   Endo/Heme/Allergies: Negative.   Psychiatric/Behavioral: Positive for depression and memory loss. The  patient has insomnia.        Patient has history of alzheimer      Past Medical History-  Patient Active Problem List   Diagnosis Date Noted  . Alzheimer's dementia (HCC) 10/22/2017    Priority: High  . History of stroke     Priority: High  . History of transient ischemic attack (TIA) 05/16/2015    Priority: High  . CAD (coronary artery disease) 04/05/2008    Priority: High  . Age-related osteoporosis without current pathological fracture 10/22/2017    Priority: Medium  . High bilirubin 02/17/2017    Priority: Medium  . Hyperglycemia 08/28/2015    Priority: Medium  . Intracranial vascular stenosis 07/02/2015    Priority: Medium  . Overactive bladder 08/14/2014    Priority: Medium  . Essential hypertension 01/17/2010    Priority: Medium  . Hyperlipidemia 01/15/2009    Priority: Medium  . ADJUSTMENT DISORDER WITH ANXIOUS MOOD 01/15/2009    Priority: Medium  . Hypothyroidism 04/05/2008    Priority: Medium  . IBS (irritable bowel syndrome) 04/05/2008    Priority: Medium  . Lumbar pain with radiation down left leg 02/21/2011    Priority: Low  . CERUMEN IMPACTION, BILATERAL 11/27/2010    Priority: Low  . GERD 04/05/2008    Priority: Low  . INTERNAL HEMORRHOIDS 06/29/2002    Priority: Low    Medications- reviewed and updated Current Outpatient Medications  Medication Sig Dispense Refill  . amLODipine (NORVASC) 5 MG tablet TAKE 1 TABLET BY MOUTH ONCE DAILY 90 tablet 3  . busPIRone (BUSPAR)  5 MG tablet Take 1 tablet (5 mg total) by mouth 2 (two) times daily as needed (anxiety). 60 tablet 5  . clopidogrel (PLAVIX) 75 MG tablet TAKE 1 TABLET BY MOUTH  DAILY 90 tablet 3  . escitalopram (LEXAPRO) 10 MG tablet Take 1 tablet (10 mg total) by mouth daily. 30 tablet 1  . LORazepam (ATIVAN) 0.5 MG tablet Take 1 tablet (0.5 mg total) by mouth daily as needed for anxiety. 30 tablet 1  . LORazepam (ATIVAN) 0.5 MG tablet Take 1 tablet (0.5 mg total) by mouth 2 (two) times daily as  needed for anxiety (needs to have 24 hour supervision if dose is used). 60 tablet 1  . SYNTHROID 100 MCG tablet TAKE 1 TABLET BY MOUTH  DAILY 90 tablet 3   No current facility-administered medications for this visit.     Objective:  Ht 4' 11.25" (1.505 m)   Wt 131 lb (59.4 kg)   LMP  (LMP Unknown)   BMI 26.24 kg/m  self reported vitals Gen: NAD, resting comfortably Lungs: nonlabored, normal respiratory rate  Skin: appears dry, no obvious rash Patient is confused about time of year     Assessment and Plan   # Dementia/anxiety S: Anxiety is significant per daughter. Peaks from 2 to 5 30 PM. Patient states that she has had some anxiety and would like to feel better. Uses ativan at around 5 30 pm when ready for bed- daughter tries to stretch to 6 or so but patient continues to ask anxiously to go to bed. Any type of activity in the day can ause significant anxiety- Went to get hair done today and was extremely anxious with questions likewhere are my keys, where are we going and continued issues since then. Any change to schedule throws her off. Someone stays with her nigthly until late. Then they leave but have a camera at house with motion alarms- and daughter only lives 1 mile away. Daughter does not feel she can put her in a facility since she will not be able to see her with Covid but financially cannot increase caregiver support- so she is really tired. Limited help from hospice- CNA a few hours a week to helpwith bathing possibly.   Patient with atypical sleep schedule- goes to bed at 5 30 PM and may wake up 10 Am next day (recently waking up at 8 AM though). The other night -Went to bed at 5 30 PM and then up at 10 30 PM and then up wandering and almost left the house but daughter caught her with camera. Gave ativan at 11 30 and went to bed.   Patient followed by hospice and they have suggested consideration of seroquel for anxiety. Patient has been on lexapro long term. We recently  added ativan but it makes her too sleepy in the daytime. Patient is getting a Hospital bed Saturday.  A/P: Patient with end stage dementia on hospice presenting with recurrent anxiety. On lexapro 10mg  at baseline- we will continue. Poor control at present.  - ativan 0.5 mg helpful at betime. We are going to try seroquel 12.5 mg and titrate to 25 mg at bedtime instead to see if helpful. -hold on bedtime ativan   - may try half tablet of ativan during daytime if does not overly sedate patient.  - stop buspirone due to family/hospice concern about serotonin syndrome though I think risk of this is low  Recommended follow up: as needed as on hospice   Lab/Order associations:  ICD-10-CM   1. Alzheimer's dementia without behavioral disturbance, unspecified timing of dementia onset (HCC)  G30.9    F02.80   2. Anxiety  F41.9     Meds ordered this encounter  Medications  . QUEtiapine (SEROQUEL) 25 MG tablet    Sig: Take 0.5-1 tablets (12.5-25 mg total) by mouth at bedtime.    Dispense:  30 tablet    Refill:  5   Time Stamp The duration of face-to-face time during this visit was greater than 25 minutes. Greater than 50% of this time was spent in counseling, explanation of diagnosis, planning of further management, and/or coordination of care including counseling daughte rabout recent stressors in caring for mother, encouraging her in the good job she is doing, discussing treatment options for anxiety.     Return precautions advised.  Tana Conch, MD

## 2019-12-13 NOTE — Telephone Encounter (Signed)
Called patient daughter let her know and made app for today. Called hospice nurse and gave update. Will call after appointment and let her know andy med changes.

## 2019-12-13 NOTE — Patient Instructions (Addendum)
-  ativan tonight -seroquel half tablet tomorrow and no ativan  Thursday- can use full tablet of seroquel.   Next day If needed- half tablet of ativan up to 11 am.

## 2019-12-15 NOTE — Progress Notes (Signed)
Paper faxed to [place] with below instructions.

## 2019-12-21 ENCOUNTER — Telehealth: Payer: Self-pay | Admitting: Family Medicine

## 2019-12-21 NOTE — Telephone Encounter (Signed)
Beth from Hospice called to report that the pt is experiencing aggressive behavior (approx. 2 weeks). Pt is not sleeping either. She has been aggressive with caretakers/family. She is normally sweet tempered, however she has been unusually mean.   Best contact [phone]

## 2019-12-21 NOTE — Telephone Encounter (Signed)
See note

## 2019-12-21 NOTE — Telephone Encounter (Signed)
Called and spoke with Joyce [name] with Hospice. She is going to get the provider with their office to start patient on new medications. Informed that Dr. Yong Channel is out of the office until Monday. She will call me after she reviews with provider and let us know any changes made.

## 2019-12-22 NOTE — Telephone Encounter (Signed)
I will await feedback from hospice and any recommendations they have- we have been trying to use seroquel

## 2019-12-26 DIAGNOSIS — Z7689 Persons encountering health services in other specified circumstances: Secondary | ICD-10-CM | POA: Diagnosis not present

## 2019-12-28 ENCOUNTER — Other Ambulatory Visit: Payer: Self-pay | Admitting: Family Medicine

## 2019-12-30 ENCOUNTER — Telehealth: Payer: Self-pay | Admitting: Family Medicine

## 2019-12-30 NOTE — Telephone Encounter (Signed)
Beth-[facility] called to let Dr. Durene Cal know that patient would be transferring to [facility] in the memory care unit on Monday 01/02/20.

## 2020-01-02 NOTE — Telephone Encounter (Signed)
Noted  

## 2020-01-05 ENCOUNTER — Other Ambulatory Visit: Payer: Self-pay | Admitting: Family Medicine

## 2020-01-16 DIAGNOSIS — R627 Adult failure to thrive: Secondary | ICD-10-CM | POA: Diagnosis not present

## 2020-01-16 DIAGNOSIS — Z515 Encounter for palliative care: Secondary | ICD-10-CM | POA: Diagnosis not present

## 2020-01-16 DIAGNOSIS — Z79899 Other long term (current) drug therapy: Secondary | ICD-10-CM | POA: Diagnosis not present

## 2020-01-23 DEATH — deceased

## 2021-09-21 IMAGING — DX DG CHEST 1V PORT
1 series · 1 of 1 positions shown · non-contrast
Comparison: 05/16/2015

CLINICAL DATA: Pt sent from PCP to ED for further evaluation of SOB
with exertion. Pt's daughter stated that the pt's oxygen level was
91% and went up after about 15 minutes. Pt's daughter also reported
progressively worsening weakness, SOB and muscle fatigue over the
past month. Medical hx of HTN.

EXAM:
PORTABLE CHEST 1 VIEW

[chest ap]
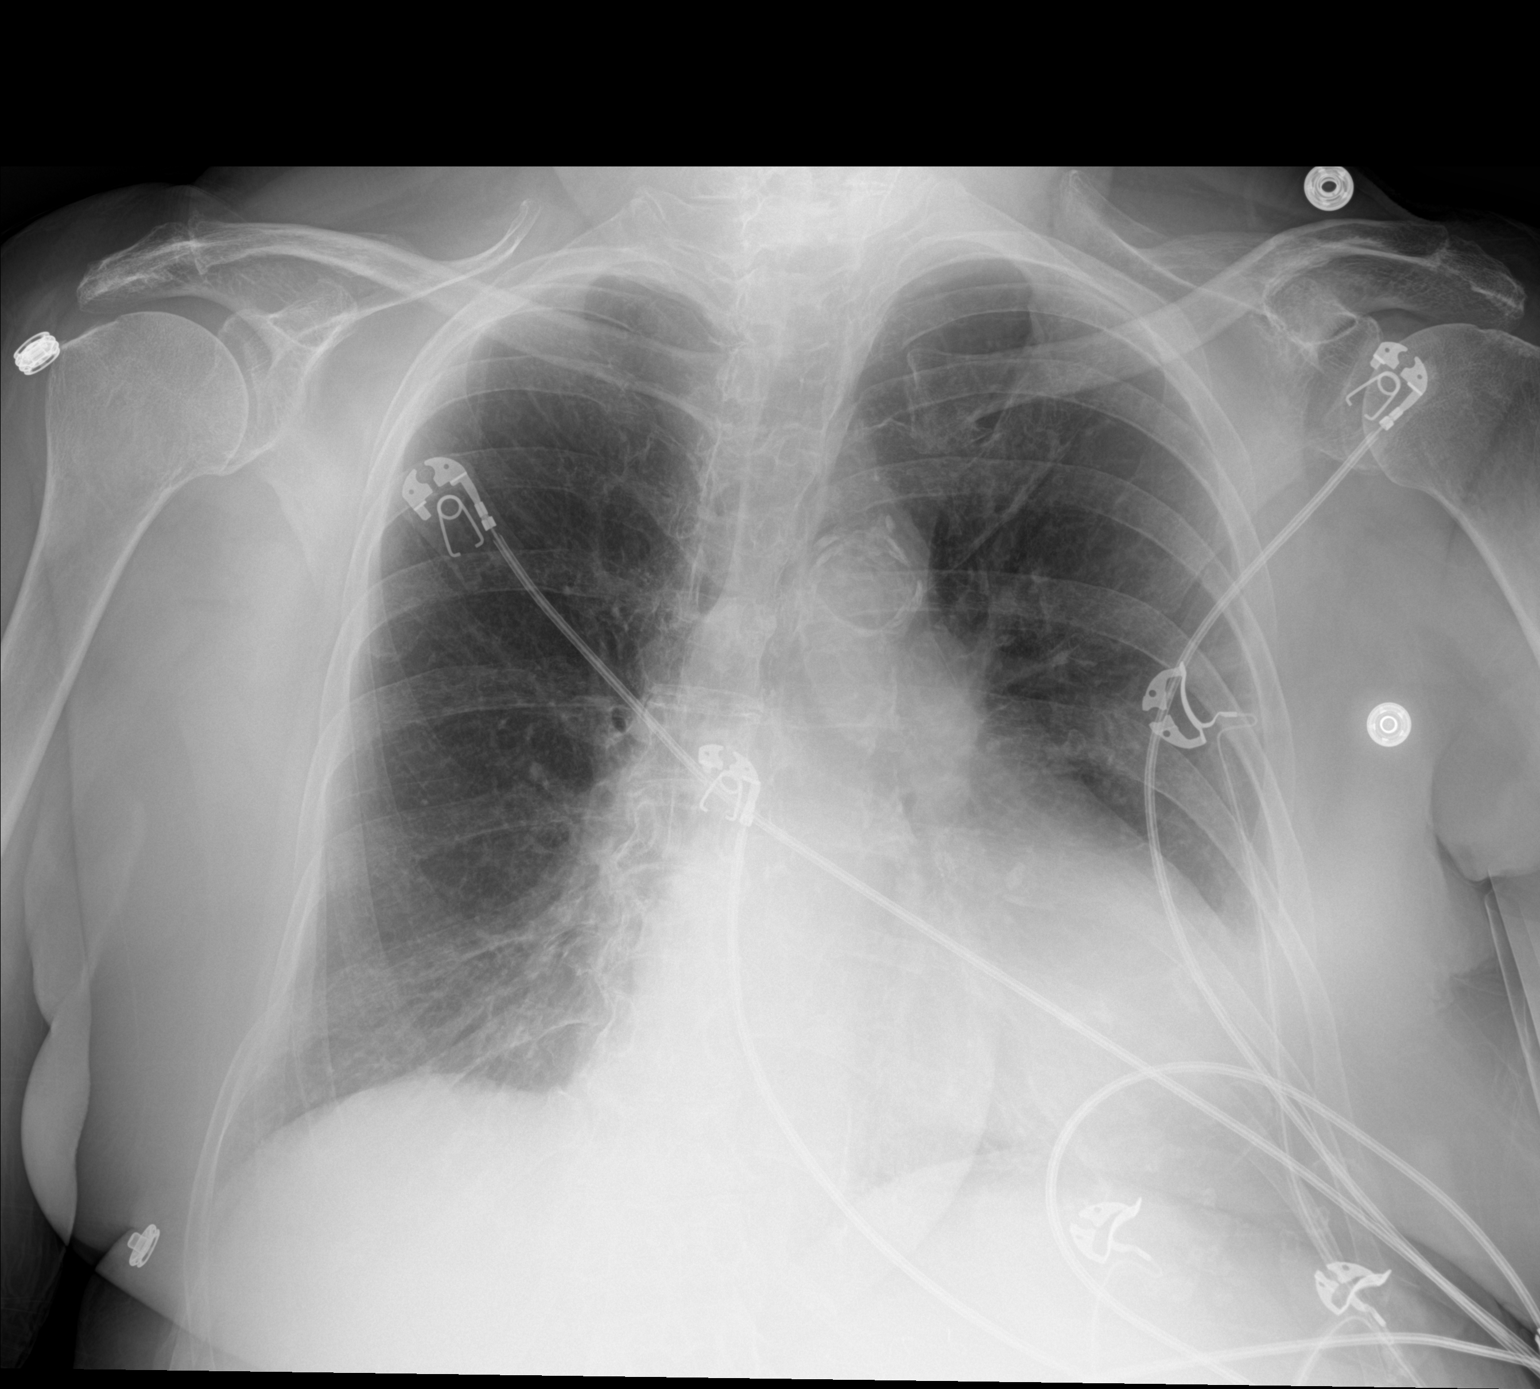

[1 of 1 positions shown; findings below may reference images not displayed]

FINDINGS: Cardiac silhouette is mildly enlarged. No mediastinal or hilar
masses. No evidence of adenopathy.

Lungs are clear. No convincing pleural effusion and no pneumothorax.

Skeletal structures are demineralized but grossly intact.
IMPRESSION: No acute cardiopulmonary disease.
